# Patient Record
Sex: Male | Born: 1975 | Race: Black or African American | Hispanic: No | Marital: Single | Smoking: Current every day smoker
Health system: Southern US, Community
[De-identification: ages and names within clinical notes are randomized; demographics above are authoritative.]

## PROBLEM LIST (undated history)

## (undated) DIAGNOSIS — R569 Unspecified convulsions: Secondary | ICD-10-CM

## (undated) DIAGNOSIS — M1990 Unspecified osteoarthritis, unspecified site: Secondary | ICD-10-CM

## (undated) DIAGNOSIS — M8700 Idiopathic aseptic necrosis of unspecified bone: Secondary | ICD-10-CM

## (undated) DIAGNOSIS — F1910 Other psychoactive substance abuse, uncomplicated: Secondary | ICD-10-CM

## (undated) DIAGNOSIS — F209 Schizophrenia, unspecified: Secondary | ICD-10-CM

## (undated) DIAGNOSIS — M87059 Idiopathic aseptic necrosis of unspecified femur: Secondary | ICD-10-CM

## (undated) HISTORY — PX: NO PAST SURGERIES: SHX2092

## (undated) HISTORY — DX: Other psychoactive substance abuse, uncomplicated: F19.10

## (undated) HISTORY — DX: Schizophrenia, unspecified: F20.9

---

## 1998-06-01 ENCOUNTER — Emergency Department (HOSPITAL_COMMUNITY): Admission: EM | Admit: 1998-06-01 | Discharge: 1998-06-01 | Payer: Self-pay | Admitting: Internal Medicine

## 2001-12-12 ENCOUNTER — Encounter: Payer: Self-pay | Admitting: Emergency Medicine

## 2001-12-12 ENCOUNTER — Emergency Department (HOSPITAL_COMMUNITY): Admission: EM | Admit: 2001-12-12 | Discharge: 2001-12-12 | Payer: Self-pay | Admitting: Emergency Medicine

## 2002-05-25 ENCOUNTER — Emergency Department (HOSPITAL_COMMUNITY): Admission: EM | Admit: 2002-05-25 | Discharge: 2002-05-25 | Payer: Self-pay | Admitting: Emergency Medicine

## 2002-05-25 ENCOUNTER — Encounter: Payer: Self-pay | Admitting: Emergency Medicine

## 2003-03-01 ENCOUNTER — Emergency Department (HOSPITAL_COMMUNITY): Admission: EM | Admit: 2003-03-01 | Discharge: 2003-03-01 | Payer: Self-pay | Admitting: Emergency Medicine

## 2003-03-01 ENCOUNTER — Encounter: Payer: Self-pay | Admitting: Emergency Medicine

## 2003-04-09 ENCOUNTER — Encounter: Admission: RE | Admit: 2003-04-09 | Discharge: 2003-04-09 | Payer: Self-pay | Admitting: Sports Medicine

## 2003-07-01 ENCOUNTER — Emergency Department (HOSPITAL_COMMUNITY): Admission: EM | Admit: 2003-07-01 | Discharge: 2003-07-01 | Payer: Self-pay | Admitting: Emergency Medicine

## 2004-02-02 ENCOUNTER — Emergency Department (HOSPITAL_COMMUNITY): Admission: EM | Admit: 2004-02-02 | Discharge: 2004-02-02 | Payer: Self-pay | Admitting: Emergency Medicine

## 2004-03-23 ENCOUNTER — Emergency Department (HOSPITAL_COMMUNITY): Admission: EM | Admit: 2004-03-23 | Discharge: 2004-03-23 | Payer: Self-pay | Admitting: Emergency Medicine

## 2004-05-22 ENCOUNTER — Emergency Department (HOSPITAL_COMMUNITY): Admission: EM | Admit: 2004-05-22 | Discharge: 2004-05-22 | Payer: Self-pay | Admitting: Emergency Medicine

## 2004-06-27 ENCOUNTER — Emergency Department (HOSPITAL_COMMUNITY): Admission: EM | Admit: 2004-06-27 | Discharge: 2004-06-27 | Payer: Self-pay | Admitting: Emergency Medicine

## 2004-09-30 ENCOUNTER — Ambulatory Visit: Payer: Self-pay | Admitting: Infectious Diseases

## 2004-10-03 ENCOUNTER — Inpatient Hospital Stay (HOSPITAL_COMMUNITY): Admission: EM | Admit: 2004-10-03 | Discharge: 2004-10-06 | Payer: Self-pay

## 2004-10-11 ENCOUNTER — Ambulatory Visit: Payer: Self-pay | Admitting: Internal Medicine

## 2004-11-21 ENCOUNTER — Emergency Department (HOSPITAL_COMMUNITY): Admission: EM | Admit: 2004-11-21 | Discharge: 2004-11-21 | Payer: Self-pay | Admitting: Emergency Medicine

## 2005-01-26 ENCOUNTER — Emergency Department (HOSPITAL_COMMUNITY): Admission: EM | Admit: 2005-01-26 | Discharge: 2005-01-26 | Payer: Self-pay | Admitting: Emergency Medicine

## 2005-02-12 ENCOUNTER — Emergency Department (HOSPITAL_COMMUNITY): Admission: EM | Admit: 2005-02-12 | Discharge: 2005-02-12 | Payer: Self-pay | Admitting: Emergency Medicine

## 2005-04-25 ENCOUNTER — Emergency Department (HOSPITAL_COMMUNITY): Admission: EM | Admit: 2005-04-25 | Discharge: 2005-04-26 | Payer: Self-pay | Admitting: Emergency Medicine

## 2005-08-14 ENCOUNTER — Inpatient Hospital Stay (HOSPITAL_COMMUNITY): Admission: EM | Admit: 2005-08-14 | Discharge: 2005-08-16 | Payer: Self-pay | Admitting: *Deleted

## 2005-10-02 ENCOUNTER — Emergency Department (HOSPITAL_COMMUNITY): Admission: EM | Admit: 2005-10-02 | Discharge: 2005-10-02 | Payer: Self-pay | Admitting: Emergency Medicine

## 2007-03-11 ENCOUNTER — Emergency Department (HOSPITAL_COMMUNITY): Admission: EM | Admit: 2007-03-11 | Discharge: 2007-03-11 | Payer: Self-pay | Admitting: Emergency Medicine

## 2007-03-11 ENCOUNTER — Inpatient Hospital Stay (HOSPITAL_COMMUNITY): Admission: EM | Admit: 2007-03-11 | Discharge: 2007-03-17 | Payer: Self-pay | Admitting: Psychiatry

## 2007-03-11 ENCOUNTER — Ambulatory Visit: Payer: Self-pay | Admitting: Psychiatry

## 2007-05-04 ENCOUNTER — Emergency Department (HOSPITAL_COMMUNITY): Admission: EM | Admit: 2007-05-04 | Discharge: 2007-05-04 | Payer: Self-pay | Admitting: Emergency Medicine

## 2007-06-05 ENCOUNTER — Emergency Department (HOSPITAL_COMMUNITY): Admission: EM | Admit: 2007-06-05 | Discharge: 2007-06-05 | Payer: Self-pay | Admitting: Emergency Medicine

## 2008-06-02 ENCOUNTER — Ambulatory Visit: Payer: Self-pay | Admitting: Psychiatry

## 2008-06-02 ENCOUNTER — Inpatient Hospital Stay (HOSPITAL_COMMUNITY): Admission: AD | Admit: 2008-06-02 | Discharge: 2008-06-06 | Payer: Self-pay | Admitting: Psychiatry

## 2008-06-02 ENCOUNTER — Emergency Department (HOSPITAL_COMMUNITY): Admission: EM | Admit: 2008-06-02 | Discharge: 2008-06-02 | Payer: Self-pay | Admitting: Emergency Medicine

## 2008-11-07 ENCOUNTER — Emergency Department (HOSPITAL_COMMUNITY): Admission: EM | Admit: 2008-11-07 | Discharge: 2008-11-07 | Payer: Self-pay | Admitting: *Deleted

## 2008-11-08 ENCOUNTER — Emergency Department (HOSPITAL_COMMUNITY): Admission: EM | Admit: 2008-11-08 | Discharge: 2008-11-08 | Payer: Self-pay | Admitting: Emergency Medicine

## 2009-05-04 ENCOUNTER — Emergency Department (HOSPITAL_COMMUNITY): Admission: EM | Admit: 2009-05-04 | Discharge: 2009-05-04 | Payer: Self-pay | Admitting: Emergency Medicine

## 2009-08-14 ENCOUNTER — Emergency Department (HOSPITAL_COMMUNITY): Admission: EM | Admit: 2009-08-14 | Discharge: 2009-08-15 | Payer: Self-pay | Admitting: Emergency Medicine

## 2010-05-06 ENCOUNTER — Emergency Department (HOSPITAL_BASED_OUTPATIENT_CLINIC_OR_DEPARTMENT_OTHER): Admission: EM | Admit: 2010-05-06 | Discharge: 2010-05-06 | Payer: Self-pay | Admitting: Emergency Medicine

## 2010-09-18 ENCOUNTER — Emergency Department (HOSPITAL_COMMUNITY): Admission: EM | Admit: 2010-09-18 | Discharge: 2010-09-19 | Payer: Self-pay | Admitting: Emergency Medicine

## 2010-12-23 ENCOUNTER — Emergency Department (HOSPITAL_COMMUNITY)
Admission: EM | Admit: 2010-12-23 | Discharge: 2010-12-24 | Payer: Self-pay | Source: Home / Self Care | Admitting: Emergency Medicine

## 2010-12-27 ENCOUNTER — Emergency Department (HOSPITAL_COMMUNITY)
Admission: EM | Admit: 2010-12-27 | Discharge: 2010-12-27 | Payer: Self-pay | Source: Home / Self Care | Admitting: Emergency Medicine

## 2011-03-09 LAB — POCT I-STAT, CHEM 8
BUN: 8 mg/dL (ref 6–23)
Calcium, Ion: 1.14 mmol/L (ref 1.12–1.32)
Chloride: 104 mEq/L (ref 96–112)
Hemoglobin: 16 g/dL (ref 13.0–17.0)

## 2011-03-14 LAB — DIFFERENTIAL
Basophils Absolute: 0.1 K/uL (ref 0.0–0.1)
Basophils Relative: 1 % (ref 0–1)
Eosinophils Absolute: 0.1 K/uL (ref 0.0–0.7)
Eosinophils Relative: 3 % (ref 0–5)
Lymphocytes Relative: 43 % (ref 12–46)
Lymphs Abs: 2.5 K/uL (ref 0.7–4.0)
Monocytes Absolute: 0.4 K/uL (ref 0.1–1.0)
Monocytes Relative: 7 % (ref 3–12)
Neutro Abs: 2.7 K/uL (ref 1.7–7.7)
Neutrophils Relative %: 47 % (ref 43–77)

## 2011-03-14 LAB — POCT TOXICOLOGY PANEL

## 2011-03-14 LAB — COMPREHENSIVE METABOLIC PANEL
Albumin: 4.5 g/dL (ref 3.5–5.2)
BUN: 14 mg/dL (ref 6–23)
Chloride: 105 mEq/L (ref 96–112)
GFR calc Af Amer: >60 mL/min (ref >60)
Potassium: 4.9 mEq/L (ref 3.5–5.1)
Sodium: 148 mEq/L — ABNORMAL HIGH (ref 135–145)
Total Protein: 8.4 g/dL — ABNORMAL HIGH (ref 6.0–8.3)

## 2011-03-14 LAB — CBC
HCT: 40.4 % (ref 39.0–52.0)
Hemoglobin: 13.8 g/dL (ref 13.0–17.0)
MCHC: 34.1 g/dL (ref 30.0–36.0)
MCV: 84.8 fL (ref 78.0–100.0)
Platelets: 389 K/uL (ref 150–400)
RBC: 4.76 MIL/uL (ref 4.22–5.81)
RDW: 13.2 % (ref 11.5–15.5)
WBC: 5.8 K/uL (ref 4.0–10.5)

## 2011-03-14 LAB — ETHANOL: Alcohol, Ethyl (B): 207 mg/dL — ABNORMAL HIGH (ref 0–10)

## 2011-03-14 LAB — PHENYTOIN LEVEL, TOTAL: Phenytoin Lvl: <3 ug/mL — ABNORMAL LOW (ref 10.0–20.0)

## 2011-04-01 LAB — POCT I-STAT, CHEM 8
Calcium, Ion: 1.24 mmol/L (ref 1.12–1.32)
HCT: 45 % (ref 39.0–52.0)
Hemoglobin: 15.3 g/dL (ref 13.0–17.0)
Potassium: 3.7 mEq/L (ref 3.5–5.1)
Sodium: 137 mEq/L (ref 135–145)
TCO2: 25 mmol/L (ref 0–100)

## 2011-04-01 LAB — RAPID URINE DRUG SCREEN, HOSP PERFORMED
Cocaine: NOT DETECTED
Opiates: NOT DETECTED

## 2011-04-01 LAB — ETHANOL: Alcohol, Ethyl (B): <5 mg/dL (ref 0–10)

## 2011-04-01 LAB — PHENYTOIN LEVEL, TOTAL: Phenytoin Lvl: <2.5 ug/mL — ABNORMAL LOW (ref 10.0–20.0)

## 2011-04-04 LAB — PHENYTOIN LEVEL, TOTAL: Phenytoin Lvl: <2.5 ug/mL — ABNORMAL LOW (ref 10.0–20.0)

## 2011-04-04 LAB — POCT I-STAT, CHEM 8
BUN: 6 mg/dL (ref 6–23)
Sodium: 135 mEq/L (ref 135–145)
TCO2: 27 mmol/L (ref 0–100)

## 2011-05-09 NOTE — H&P (Signed)
[name], [name]                ACCOUNT NO.:  [id number]   MEDICAL RECORD NO.:  [id number]          PATIENT TYPE:  IPS   LOCATION:  0406                          FACILITY:  [facility]   PHYSICIAN:  Geoffery Lyons, M.D.      DATE OF BIRTH:  1976-05-26   DATE OF ADMISSION:  06/02/2008  DATE OF DISCHARGE:                       PSYCHIATRIC ADMISSION ASSESSMENT   HISTORY OF PRESENT ILLNESS:  The patient presents with a history of  psychosis, experiencing auditory hallucinations, states that they are  not telling him to hurt himself or others.  He denies any suicidal or  homicidal thoughts.  He states that he is here to go get away after  having a conflict with his sister.  He has been sleeping satisfactory.  Appetite has been satisfactory.  Promises safety on the unit.   PAST PSYCHIATRIC HISTORY:  The patient was here approximately one year  ago.  Goes to [facility] for outpatient mental health  services.   SOCIAL HISTORY:  He is a 35 year old single male who lives his sister.  He lives here in [place].   FAMILY HISTORY:  None.   ALCOHOL AND DRUG HISTORY:  The patient smokes.  Has had some recent  alcohol intake.  Denies any drug use.   PRIMARY CARE Ernisha Sorn:  None.   MEDICAL PROBLEMS:  Are seizure disorder, hypertension with his last  seizure being 2 months ago.  The patient reports going to the emergency  room to get his refills on his medications.   MEDICATIONS:  Dilantin 100 mg b.i.d. and Cogentin 1 mg at bedtime   DRUG ALLERGIES:  No known allergies.   PHYSICAL EXAMINATION:  This is a well-nourished young male who was  assessed at [facility].  He did receive Dilantin 400 mg because of a  Dilantin level of less than 2.5.  His temperature is 98.2, 92 heart  rate, 20 respirations, blood pressure is 106/65.  He is 98% saturated.   His laboratory data and Dilantin level less than 2.5.  Urine drug screen  is negative.  His hematocrit is 38.3.  Alcohol level was  18.  UA was  negative.   MENTAL STATUS EXAM:  The patient is awake, cooperative, resting in his  bed.  He has fair eye contact.  Speech is clear, brief responses.  Mood  is depressed.  The patient is pleasant but does not offer much  information, endorsing auditory hallucinations.  Denies any suicidal or  homicidal thoughts.  Does not appear to be actively responding to  internal stimuli.  Cognitive function intact.  His memory is fair.  Judgment and insight is fair.  He is somewhat of a poor historian.   AXIS I:  Psychosis, not otherwise specified.  AXIS II:  Deferred.  AXIS III:  Seizure disorder and hypertension.  AXIS IV:  Psychosocial problems, possible problems related with primary  support group relating to his sister.  AXIS V:  Current is 35-40.   Will put patient on seizure precautions.  We will resume his Dilantin  200 mg p.o. b.i.d.  The patient was encouraged to  get a primary care  doctor for his health issues.  Will contact sister for background and  returning to living arrangements.  Will have Zyprexa available on a  p.r.n. basis.  Will reinforce medication compliance.  The patient is to  attend the yellow group.  Tentative length of stay is 3-5 days.      Landry Corporal, N.P.      Geoffery Lyons, M.D.  Electronically Signed    JO/MEDQ  D:  06/05/2008  T:  06/05/2008  Job:  [id number]

## 2011-05-12 NOTE — Discharge Summary (Signed)
NAME:  [name], [name]                ACCOUNT NO.:  [id number]   MEDICAL RECORD NO.:  [id number]          PATIENT TYPE:  INP   LOCATION:  5020                         FACILITY:  [facility]   PHYSICIAN:  Sherin Quarry, MD      DATE OF BIRTH:  1976/06/21   DATE OF ADMISSION:  08/13/2005  DATE OF DISCHARGE:  08/16/2005                                 DISCHARGE SUMMARY   HISTORY OF PRESENT ILLNESS:  [name] is a 35 year old male with a long-  standing history of seizures who was admitted to [facility] on  several occasions last year because of recurrent seizures.  On August 14, 2005 about 10 p.m. his sister witnessed him to have a generalized seizure  associated with choking, gagging and a sound as if he was swallowing his  secretions.  He was transported to the [facility] and on arrival  was noted to have a temperature of 100, pulse of 130 and oxygen saturation  of 88%.  He was placed on oxygen.  Chest x-ray showed only evidence of  bronchitis.  The patient was found to have no Dilantin in his blood and  therefore was given 400 mg of p.o. Dilantin on two occasions over the course  of the evening.  The patient states that he lost his Dilantin and has not  had any for six weeks.  The patient has a long-standing history of alcohol  abuse and states that he drank a particularly large amount of alcohol two  days prior to admission but had not drank any alcohol since.  His blood  alcohol level was negative on admission.   PHYSICAL EXAMINATION:  GENERAL:  Revealed an awake, alert, cooperative man.  VITAL SIGNS:  Blood pressure was 150/90, pulse 133, respirations 20, he was  on four liters of oxygen.  CHEST:  Revealed scattered rhonchi and expiratory wheezing.  CARDIOVASCULAR:  Revealed a sinus tachycardia.  There were no rubs, murmurs  or gallops.  ABDOMEN:  Benign.  NEUROLOGIC:  Cranial nerves, motor, sensory and cerebellar testing was  normal.  EXTREMITIES:  Revealed no  evidence of clubbing, cyanosis, or edema.   LABORATORY DATA:  Relevant studies included a sodium of 136, potassium 3.1,  creatinine 1.0, BUN 4, hemoglobin 11.9, CBC revealed a white count of 6300.  The D-dimer was 0.86.  A1C hemoglobin was 5.7.  As previously mentioned  there was no detectable Dilantin in the blood.  Urine drug screen was  positive for THC.  A followup chest x-ray was obtained on August 15, 2005  which showed a small left lower lobe infiltrate possibly consistent with a  developing pneumonia.   HOSPITAL COURSE:  On admission the patient was placed on Ativan alcohol  withdrawal protocol.  He was given oxygen and intravenous fluids in the form  of normal saline at 125 mL/h.  Avelox 400 mg IV daily was started and the  patient was placed on nebulizer treatments with Xopenex 1.25 every four  hours and p.r.n.  As part of the Ativan alcohol withdrawal protocol he  received thiamine 100  mg IV daily.  Subsequently he was placed on Dilantin  300 mg daily.  Efforts were made to arrange for alcohol detoxification  program and the patient will go through intake procedure with [facility] to initiate detoxification program in [place].  Therefore on August 16, 2005 the patient was discharged.   DISCHARGE DIAGNOSES:  1.  Recurrent seizures secondary to medication noncompliance and alcohol      abuse.  2.  Chronic alcohol abuse with history of delirium tremens.  3.  Probable aspiration pneumonia.  4.  A 25 pack year smoking history.  5.  History of cocaine abuse.   DISCHARGE MEDICATIONS:  The patient will remain on Dilantin 300 mg daily.  He will receive Avelox 400 mg daily x7 additional days.  Hopefully he will  be continued on alcohol detoxification program at the [facility] facility.           ______________________________  Sherin Quarry, MD     SY/MEDQ  D:  08/16/2005  T:  08/16/2005  Job:  [id number]
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
## 2011-05-12 NOTE — Discharge Summary (Signed)
NAMEKhyre Garcia, [name]                ACCOUNT NO.:  [id number]   MEDICAL RECORD NO.:  [id number]          PATIENT TYPE:  INP   LOCATION:  5727                         FACILITY:  [facility]   PHYSICIAN:  Fransisco Hertz, M.D.  DATE OF BIRTH:  [birth year]   DATE OF ADMISSION:  09/30/2004  DATE OF DISCHARGE:  10/06/2004                                 DISCHARGE SUMMARY   DIAGNOSES ON THIS ADMISSION:  1.  Alcoholic gastritis.  2.  Alcohol abuse with alcohol withdrawal.  3.  Cocaine abuse.  4.  Volume depletion.  5.  History of seizures.  6.  Renal insufficiency.  7.  Psychosis.   DISCHARGE MEDICATIONS:  1.  Dilantin 300 mg p.o. nightly.  2.  Lisinopril 20 mg half a tab p.o. daily.   DISCHARGE INSTRUCTIONS:  The patient was provided with the number for ADS,  [phone].  He was to stop drinking and to drop cocaine abuse.   DIET AND WOUND CARE:  He had no restrictions on diet or wound care.   FOLLOWUP:  He has an appointment with Dr. Laurell Roof at [facility] on October 11, 2004 at 3 p.m., phone number [phone].   HISTORY OF PRESENT ILLNESS:  [name] is a 35 year old man with history of  seizure activity, not well-defined, who has had 2 episodes in the last year,  otherwise, he has no past medical history.  He presented to the emergency  room with emesis x1 day.  The emesis consisted of digested food and bile, no  blood, no coffee-grounds material.  He has abdominal pain which is  intermittent and diffuse, however, more pronounced in deep epigastric area.  He has no diarrhea, negative fevers.  He has some chills and he also  complained of anorexia, denied dysuria or hematochezia and/or acid reflux.   PHYSICAL EXAMINATION:  VITAL SIGNS:  On physical exam, he had a blood  pressure of 131/84, temperature 99.3, respirations 16, O2 saturation of 97.  GENERAL:  He was in no acute distress, oriented x4.  ENT:  Within normal limits.  NECK:  Neck was supple.  LUNGS:   Lungs clear to auscultation with good air movement.  HEART:  Regular rate and rhythm.  ABDOMEN:  Soft.  No distention.  He had some epigastric and right upper  quadrant tenderness with no rebound, no guarding and good bowel sounds.  He  had a negative Murphy's.  He had no edema and without any rash.  NEUROLOGIC:  Intact.  PSYCHIATRIC:  __________  mood and affect.   LABORATORIES ON ADMISSION:  He had a Dilantin level of less than 2.5, lipase  of 35, sodium of 133, potassium of 4.3, chloride of 101, bicarb of 19, BUN  of 14, creatinine of 1.6, glucose of 80.  His transaminases were negative  for a bilirubin of 1.1, alkaline phosphatase of 23, AST of 200, ALT of 117,  protein of 6.7, albumin of 3.9, calcium of 8.5.  He had a white count of  9.9, hemoglobin of 13.9, ANC of 7.3, platelets of 123 and MCV of  92.   He had an EKG with normal sinus rhythm, no ST changes.   He had alcohol level of less than 5 and a PT of 14.   HOSPITAL COURSE:  PROBLEM #1 - NAUSEA AND VOMITING:  Given the patient's  anorexia and volume depletion, the patient was supported with IV fluids very  aggressively, normal saline, and electrolytes were replaced as needed.  He  was first kept n.p.o., then he was started on clear liquids and titrated his  fluid as tolerated.  The elevated transaminases which were consistent with  an alcohol etiology continued to be improving, however, hepatitis panel was  checked and was negative for hepatitis.  He also had an HIV that was  nonreactive.  The patient, as mentioned, was able to tolerate p.o.'s and it  was presumed that the patient had a gastritis, which was treated with a PPI  secondary to the alcohol abuse.   PROBLEM #2 - HISTORY OF SEIZURE DISORDER:  The patient was seizure-free  during hospitalization, however, he was started back on Dilantin, given his  history of seizures in the past.   PROBLEM #3 - ACUTE RENAL FAILURE:  The patient was aggressively hydrated.  He had  a renal ultrasound which was negative.  He had a UA which was also  negative and it is presumed that this is the patient's baseline, which is a  borderline creatinine at 1.5 to 1.8.   PROBLEM #4 - ALCOHOL ABUSE:  Though the patient admitted on admission to  alcohol abuse, he later denied ever taking in alcohol.  He had multiple  family members reiterate that he has a long history of alcohol abuse.  The  patient did go through alcohol withdrawal.  He was started on a  benzodiazepine to help with the withdrawal symptoms.   PROBLEM #5 - HISTORY OF COCAINE ABUSE:  Again, the patient __________  cocaine abuse and he had a urine drug screen that was positive for cocaine  metabolites.   PROBLEM #6 - PSYCHOSIS:  The patient had a period of psychotic symptoms  while he was undergoing alcohol withdrawal.  His symptoms were consistent  with polysubstance abuse, however, the family mentions that the patient had  had some other psychotic episodes in the past and given that they wanted to  go against the patient's wishes and wanted to seek placement for the  patient's, a formal psychiatric consult was done.  Dr. Antonietta Breach  determined that the patient's psychosis was only episode related to his  substance abuse and otherwise he was competent to make his decisions and the  patient was consulted at length with regard to both problems, and given  numbers to contact ADS.  As mentioned above, the patient was stable on day  of discharge and he was to follow up with Dr. Jeanelle Malling.       LC/MEDQ  D:  11/13/2004  T:  11/14/2004  Job:  [id number]

## 2011-05-12 NOTE — Consult Note (Signed)
[place]  Patient:    [name] Visit Number: [id number] MRN: [id number]          Service Type: EMS Location: [facility] Attending Physician:  Ilene Qua Dictated by:   Deanna Artis. Sharene Skeans, M.D. Proc. Date: 05/25/02 Admit Date:  05/25/2002                            Consultation Report  DATE OF BIRTH:  [birth year]  CHIEF COMPLAINT:  New onset seizures.  HISTORY OF PRESENT CONDITION:   [name] is a 35 year old, right-handed, single, African-American young man who had onset of a seizure today around 3:30 p.m.  The patient had awakened this morning and felt nauseated and vomited.  He still felt rather poorly but went out to a store with his girlfriend.  In the store, the patient began to twist around and clenched his arms up to his body. He fell to the ground and had a generalized tonic-clonic seizure that lasted for possibly 15 minutes; timing is uncertain.  The patient continued to have slight jerking as EMS arrived; however, they felt the patient was conscious and confused, postictal, but able to answer simple questions.  He did not lose control of his bowels or bladder.  He had bloody saliva come from his mouth and, indeed, had bitten the back of his tongue.  He also seems to have some lacerations in the back of his gums as if by blunt trauma.  The patient has never had a seizure before, and he denies use of cocaine or other recreational drugs.  He does drink alcohol and says the day before the seizure, he had a "couple of beers and a shot of gin."   He has alcohol up to four cans per day on may days but not every day.  The patient has never had a closed head injury.  There are no other know risk factors for seizure in this patient.  REVIEW OF SYSTEMS:  Constitutional: The patient usually has good appetite and normal sleep habits.  The reason for his nausea is unclear.  He had not eaten anything in the morning, but his  mother says there are things that eats that do not agree with him.  I do not have the feeling that this was alcohol related, although I am not certain about that.  The patient has not had any fevers, rhinorrhea, cough, shortness of breath, chest pain, hypertension, bleeding dyscrasias, diarrhea, or musculoskeletal complaints.  No diabetes.  Review of Systems is otherwise negative.  PRESENT MEDICATIONS:  The patient has not had significant medical problems.  PAST SURGICAL HISTORY:  None.  CURRENT MEDICATIONS:  None.  ALLERGIES:  None.  FAMILY HISTORY:  Positive to stroke and Alzheimers disease in his maternal uncle and maternal grandmother, respectively.  No history of seizures, migraines, mental retardation, blindness, deafness, birth defects, consanguinity.  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  He does not use cigarettes or tobacco.  PHYSICAL EXAMINATION:  GENERAL:  Pleasant gentleman, subdued, in no acute distress.  VITAL SIGNS:  Blood pressure 142/69, resting pulse 98, respirations 20, pulse oximetry 98%.  Afebrile.  HEENT:  No signs of infection.  He does have a lacerated tongue on the left side.  As mentioned, back in the angles of his jaw, there appear to be two small lacerations that look as if there was  blunt trauma.  Buccal mucosa looks fine.  NECK:  Supple.  No localized tenderness.  No cranial or cervical bruits.  LUNGS:  Clear to auscultation.  HEART:  No murmurs, pulses normal.  ABDOMEN:  Soft, nontender.  Bowel sounds normal.  EXTREMITIES:  Well formed without edema, cyanosis, alterations in tone, or tight heel cords.  NEUROLOGIC:  Mental status: The patient was awake, alert, attentive, appropriate.  No dysphagia or dyspraxia.  Cranial Nerves: Round, reactive pupils, normal fundi.  Full visual fields to double simultaneous stimulation. Extraocular movements full and conjugate.  OKN responses equal  bilaterally. Symmetric facial strength.  Midline tongue and uvula.  Air conduction greater than bone conduction bilaterally.  Motor examination: Normal strength, tone, and mass.  Good fine motor movements.  No pronator drift.  Sensation intact to cold, vibration, stereoagnosis.  Cerebellar examination: Good finger-to-nose, rapid repetitive movements.  Gait and station was normal.  He was able to walk on his heels and toes and perform a tandem without difficulty.  IMPRESSION:  New onset seizure, not definitely epilepsy (780.39).  MEDICAL DECISION MAKING:  The patient has no predisposing factors to cause him to have seizures unless his history about alcohol use is specious.  At this time, the likelihood of recurrent seizure within the next 24 hours is low. Therefore, there is no reason to admit him to the hospital.  He has been watched between 4 oclock and the present time, 7:40, without any signs of seizure activity.  I personally reviewed his CT scan of the brain, and it is normal.  His i-STAT showed a glucose of 131, BUN 7, sodium 133, potassium 4.5, chloride 103, CO2 24.  Hemoglobin 18, hematocrit 53.  His pH was 7.337, delta base -3.  PLAN:  The patient will have an EEG either at [facility] or at [facility] early this week.  Depending upon the results, we may place him on antiepileptic medicines.  I discussed this thoroughly with the family including the odds of recurrent seizures.  With normal examination, CT, and if he has normal EEG and no positive family history, the likelihood of recurrence is only about 30%.  With a positive EEG and other factors being positive, it may be has high as 70%.  The patient does not drive any motor vehicle.  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  The patient was sent out with warnings to be careful in closed areas such as the bathroom.  He cannot swim alone or wander from his home alone for the next several  months.  He is to return tonight if he has recurrent seizure, and he will be admitted to the  hospital.  If has recurrent seizure at some remote time, we will likely place him on antiepileptic medicine at that time if it has not already happened. Dictated by:   Deanna Artis. Sharene Skeans, M.D. Attending Physician:  Ilene Qua DD:  05/25/02 TD:  05/26/02 Job: [id number]

## 2011-05-12 NOTE — H&P (Signed)
NAMEAJANI, RINEER                ACCOUNT NO.:  [id number]   MEDICAL RECORD NO.:  [id number]          PATIENT TYPE:  INP   LOCATION:                               FACILITY:  [facility]   PHYSICIAN:  Sherin Quarry, MD      DATE OF BIRTH:  1976-08-18   DATE OF ADMISSION:  08/14/2005  DATE OF DISCHARGE:                                HISTORY & PHYSICAL   Mr. [name] is a 35 year old man with a long-standing history of  seizures.  Mr. [name] was admitted to [facility] on three occasions  last year because of generalized seizures.  Tonight at about 10 p.m., his  sister witnessed him to have a generalized seizure and he was transported by  EMS to [facility].  On arrival to the ER, he was noted to have a  temperature of 100.  His pulse was 130.  Initially his O2 saturation was  88%.  His O2 saturation at this point is 94% on 4 L.  A chest x-ray was  obtained which showed only evidence of bronchitis.  The patient was given  dilantin 400 mg p.o. and a nebulizer treatment and [facility] were  called to arrange hospitalization.   Mr. [name] has a long-standing history of not taking his dilantin.  He says  that he lost it and has not taken it for several weeks.  He apparently gets  his dilantin normally by coming to the ER and having the doctors there write  him prescriptions.  Mr. [name] has a long-standing history of alcohol abuse.  He says that he drank a substantial amount of alcohol two days ago but has  not drunk any alcohol since that time.  The alcohol level in the blood is  negative.  He has a former history of cocaine abuse but he says that he has  been smoking crack recently and there is no evidence of cocaine in his  urine.   MEDICATIONS:  Essentially none.   ALLERGIES:  None.   PAST MEDICAL HISTORY:  The patient states that he has had no operations.  He  states that he has never been hospitalized except for problems related to  seizures.   FAMILY HISTORY:   The patient states that he does not know his father.  His  mother has a history of diabetes.  His brother and sister are in good health  although he thinks that his brother may having a surgical procedure in  [place] in the near future.  He does not know anything about the details  of this.   SOCIAL HISTORY:  The patient lives with his sister.  He smokes about 1/2 to  one pack of cigarettes per day.  He smokes marijuana.  Drug use is as  described above.   REVIEW OF SYSTEMS:  HEENT: Head: He denies headache or dizziness.  Eyes: He  denies visual blurring or diplopia.  Ear, nose, and throat: He denies  earaches, sinus pain, or sore throat.  CHEST: He states that he had a little  bit of a cough  before he had the seizure and had noted that he was coughing  up some clear phlegm.  He denies chest pain.  CARDIOVASCULAR: He denies  orthopnea or PND.  GI: He denies nausea, vomiting, or abdominal pain.  GENITOURINARY: Denies dysuria or urinary frequency.  NEUROLOGIC: See above.  ENDOCRINE: Denies excessive thirst, urinary frequency, or nocturia.   PHYSICAL EXAMINATION:  GENERAL: He is a awake and alert and cooperative.  VITAL SIGNS: Temperature is 100, blood pressure 154/94, pulse is 133 and  regular, respirations 20.  He is on 4 L of oxygen.  HEENT: Within normal limits.  CHEST: Scattered rhonchi, mild expiratory wheezing.  CARDIOVASCULAR: Tachycardia.  There is no S1, S2, no rubs, murmurs, or  gallops.  ABDOMEN: Benign, normal bowel sounds, without masses or tenderness.  NEUROLOGIC: Testing is within normal limits including normal cranial nerves,  motor, sensory, and cerebellar testing.  EXTREMITIES: No evidence of cyanosis or edema.   LABORATORY STUDIES:  Sodium is 130, potassium is 3.8, glucose is 165.  Hemoglobin 16.  White count is 6300.  Urine is positive for THC.  Alcohol  level is 5.  Dilantin level is less than 2.5.  Arterial blood gas showed a  PO2 of 63.   IMPRESSION:  1.   Recurrent seizure disorder with seizure occurring because of      noncompliance with medication.  2.  Chronic alcohol abuse with history of delirium tremens.  3.  Aspiration versus bronchitis.  4.  A 25 pack/year smoking history.  5.  Past history of cocaine abuse.   The patient will be admitted.  We will give him oxygen nebulizer treatment.  I will give him IV Avelox empirically because of bronchitis versus  aspiration and we will plan to repeat his chest x-ray.  We will give him IV  fluids and place him on Ativan, alcohol withdrawal protocol.           ______________________________  Sherin Quarry, MD     SY/MEDQ  D:  08/14/2005  T:  08/14/2005  Job:  [id number]

## 2011-05-12 NOTE — Discharge Summary (Signed)
NAME:  Sida, Quinton                ACCOUNT NO.:  [id number]   MEDICAL RECORD NO.:  [id number]          PATIENT TYPE:  IPS   LOCATION:  0406                          [facility]ACILITY[facility]  [facility]   PHYSICIAN:  Geoffery Lyons, M.D.      DATE OF BIRTH:  12/24/1976   DATE OF ADMISSION:  06/02/2008  DATE OF DISCHARGE:  06/06/2008                               DISCHARGE SUMMARY   CHIEF COMPLAINT/PRESENT ILLNESS:  This was the second admission to [facility] for this 35 year old male who presented with a  history of psychosis, experiencing auditory hallucinations, stating that  they are not telling him to hurt himself or others.  Denies any suicidal  or homicidal thoughts.  States that he is here to get away after having  a conflict with his sister.  Has been sleeping satisfactorily.   PAST PSYCHIATRIC HISTORY:  Was here one year prior to this admission.  Goes to [employer].   ALCOHOL HISTORY:  Had some recent alcohol intake.  Denied any regular  drug use.   MEDICAL HISTORY:  1. Seizure disorder. Last seizure 2 months prior to this admission.  2. Hypertension.   MEDICATIONS:  1. Dilantin 100 mg twice daily.  2. Cogentin 1 mg at night.   PHYSICAL EXAMINATION:  Failed to show any acute findings.   LABORATORY WORKUP:  Dilantin level less than 2.5.  UD is negative.  Hematocrit 38.3.  Alcohol level 18.  UA was negative.   MENTAL STATUS EXAM:  Reveals an alert, cooperative male resting in bed.  Fair eye contact.  Pupils clear.  Brief response.  Mood is depressed.  Affect depressed.  Thought process logical, coherent and relevant.  Denies any acute suicidal or homicidal ideas.  No hallucinations or  delusions.  Cognition well preserved.   ADMISSION DIAGNOSES:  Axis I:  Psychotic disorder, not otherwise  specified.  Axis II:  No diagnosis.  Axis III:  Seizure disorder, hypertension.  Axis IV:  Moderate.  Axis V:  Upon admission 35.  Highest GAF in the last year 60.   COURSE IN  THE HOSPITAL:  He was admitted, started on individual and  group psychotherapy.  As already stated, he endorsed he was hearing  stuff.  He got into an argument with the sister.  Has three children.  Has a 76 year old boy that he sees every other weekend.  He was in [facility] once and [place]  once.  He had been staying  with his girlfriend after his sister kicked him out.  Had been having  auditory hallucinations, voices telling him to kill himself  and his  sister.  Endorsed that he had thought of overdosing on his medication,  but felt he would not do it.  June 12 said he was feeling okay.  He was  going back and stay with the girlfriend.  Endorsed he was feeling  better.  On June 13 he was up and about, compliant with medication.  Endorsed no suicidal or homicidal ideas, no hallucinations.  He was  going to be with his  sister.  We went ahead and made the appropriate  followup appointments.  Followup with Dr. Allena Napoleon at [facility].   DISCHARGE DIAGNOSES:  Axis I:  Psychotic disorder, not otherwise  specified.  Axis II:  No diagnosis.  Axis III:  Seizure disorder.  Axis IV:  Moderate.  Axis VI:  Upon discharge 50.   DISCHARGED ON:  1. Dilantin 100 mg 3 times daily.  2. Zyprexa 10 mg at bedtime.  3. Cogentin 1 mg at bedtime.   FOLLOWUP:  Dr. Allena Napoleon, [facility].      Geoffery Lyons, M.D.  Electronically Signed     IL/MEDQ  D:  07/08/2008  T:  07/08/2008  Job:  [id number]
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
## 2011-08-26 ENCOUNTER — Emergency Department (HOSPITAL_COMMUNITY)
Admission: EM | Admit: 2011-08-26 | Discharge: 2011-08-27 | Disposition: A | Discharge status: Home / Self Care | Payer: Self-pay | Attending: Emergency Medicine | Admitting: Emergency Medicine

## 2011-08-26 DIAGNOSIS — G2402 Drug induced acute dystonia: Secondary | ICD-10-CM | POA: Insufficient documentation

## 2011-08-26 DIAGNOSIS — R6884 Jaw pain: Secondary | ICD-10-CM | POA: Insufficient documentation

## 2011-08-26 DIAGNOSIS — G40909 Epilepsy, unspecified, not intractable, without status epilepticus: Secondary | ICD-10-CM | POA: Insufficient documentation

## 2011-08-26 DIAGNOSIS — I10 Essential (primary) hypertension: Secondary | ICD-10-CM | POA: Insufficient documentation

## 2011-08-26 DIAGNOSIS — Z760 Encounter for issue of repeat prescription: Secondary | ICD-10-CM | POA: Insufficient documentation

## 2011-08-26 DIAGNOSIS — K1370 Unspecified lesions of oral mucosa: Secondary | ICD-10-CM | POA: Insufficient documentation

## 2011-09-21 LAB — URINALYSIS, ROUTINE W REFLEX MICROSCOPIC
Nitrite: NEGATIVE
Protein, ur: NEGATIVE
Specific Gravity, Urine: 1.029
Urobilinogen, UA: 1

## 2011-09-21 LAB — BASIC METABOLIC PANEL
BUN: 8
Calcium: 9
GFR calc non Af Amer: >60
Potassium: 4

## 2011-09-21 LAB — DIFFERENTIAL
Basophils Relative: 0
Eosinophils Absolute: 0.2
Lymphs Abs: 1.7
Neutrophils Relative %: 41 — ABNORMAL LOW

## 2011-09-21 LAB — CBC
MCHC: 34.5
MCV: 92.1
Platelets: 216
WBC: 4.3

## 2011-09-21 LAB — ETHANOL: Alcohol, Ethyl (B): 18 — ABNORMAL HIGH

## 2011-09-21 LAB — URINE MICROSCOPIC-ADD ON

## 2011-09-21 LAB — PHENYTOIN LEVEL, TOTAL: Phenytoin Lvl: <2.5 — ABNORMAL LOW

## 2011-09-21 LAB — RAPID URINE DRUG SCREEN, HOSP PERFORMED: Barbiturates: NOT DETECTED

## 2011-09-26 LAB — CBC
RBC: 4.48
WBC: 5

## 2011-09-26 LAB — PHENYTOIN LEVEL, TOTAL: Phenytoin Lvl: <2.5 — ABNORMAL LOW

## 2011-09-26 LAB — COMPREHENSIVE METABOLIC PANEL
ALT: 64 — ABNORMAL HIGH
AST: 85 — ABNORMAL HIGH
CO2: 26
Calcium: 8.9
Chloride: 107
GFR calc Af Amer: >60
GFR calc non Af Amer: >60
Sodium: 145

## 2011-09-26 LAB — URINALYSIS, ROUTINE W REFLEX MICROSCOPIC
Glucose, UA: 100 — AB
Hgb urine dipstick: NEGATIVE
Specific Gravity, Urine: 1.029
pH: 5.5

## 2011-09-26 LAB — DIFFERENTIAL
Eosinophils Absolute: 0.1
Eosinophils Relative: 2
Lymphs Abs: 1.9
Monocytes Absolute: 0.4

## 2011-09-26 LAB — RAPID URINE DRUG SCREEN, HOSP PERFORMED: Barbiturates: NOT DETECTED

## 2011-09-26 LAB — URINE MICROSCOPIC-ADD ON

## 2011-10-12 LAB — BASIC METABOLIC PANEL
BUN: 7
Calcium: 9.4
Creatinine, Ser: 1
GFR calc non Af Amer: >60
Glucose, Bld: 103 — ABNORMAL HIGH
Sodium: 137

## 2011-10-12 LAB — CBC
MCHC: 34.3
Platelets: 267
RDW: 13.9

## 2011-10-12 LAB — DIFFERENTIAL
Basophils Absolute: 0
Basophils Relative: 0
Lymphocytes Relative: 18
Neutro Abs: 5.5
Neutrophils Relative %: 72

## 2011-10-12 LAB — RAPID URINE DRUG SCREEN, HOSP PERFORMED
Amphetamines: NOT DETECTED
Benzodiazepines: NOT DETECTED
Cocaine: NOT DETECTED
Opiates: NOT DETECTED
Tetrahydrocannabinol: NOT DETECTED

## 2011-10-12 LAB — PHENYTOIN LEVEL, TOTAL: Phenytoin Lvl: <2.5 — ABNORMAL LOW

## 2011-10-24 ENCOUNTER — Emergency Department (HOSPITAL_COMMUNITY)
Admission: EM | Admit: 2011-10-24 | Discharge: 2011-10-24 | Disposition: A | Discharge status: Home / Self Care | Payer: Medicaid Other | Attending: Emergency Medicine | Admitting: Emergency Medicine

## 2011-10-24 ENCOUNTER — Emergency Department (HOSPITAL_COMMUNITY): Payer: Medicaid Other

## 2011-10-24 DIAGNOSIS — M25559 Pain in unspecified hip: Secondary | ICD-10-CM | POA: Insufficient documentation

## 2011-10-24 DIAGNOSIS — Z79899 Other long term (current) drug therapy: Secondary | ICD-10-CM | POA: Insufficient documentation

## 2011-10-24 DIAGNOSIS — G40909 Epilepsy, unspecified, not intractable, without status epilepticus: Secondary | ICD-10-CM | POA: Insufficient documentation

## 2011-10-24 DIAGNOSIS — Z87828 Personal history of other (healed) physical injury and trauma: Secondary | ICD-10-CM | POA: Insufficient documentation

## 2012-02-05 DIAGNOSIS — F17200 Nicotine dependence, unspecified, uncomplicated: Secondary | ICD-10-CM | POA: Insufficient documentation

## 2012-02-05 DIAGNOSIS — F319 Bipolar disorder, unspecified: Secondary | ICD-10-CM | POA: Insufficient documentation

## 2012-02-05 DIAGNOSIS — I10 Essential (primary) hypertension: Secondary | ICD-10-CM | POA: Insufficient documentation

## 2012-02-05 DIAGNOSIS — M79609 Pain in unspecified limb: Secondary | ICD-10-CM | POA: Insufficient documentation

## 2012-02-05 DIAGNOSIS — G8929 Other chronic pain: Secondary | ICD-10-CM | POA: Insufficient documentation

## 2012-02-05 DIAGNOSIS — Z79899 Other long term (current) drug therapy: Secondary | ICD-10-CM | POA: Insufficient documentation

## 2012-02-05 DIAGNOSIS — M25559 Pain in unspecified hip: Secondary | ICD-10-CM | POA: Insufficient documentation

## 2012-02-06 ENCOUNTER — Encounter (HOSPITAL_COMMUNITY): Payer: Self-pay | Admitting: Emergency Medicine

## 2012-02-06 ENCOUNTER — Emergency Department (HOSPITAL_COMMUNITY)
Admission: EM | Admit: 2012-02-06 | Discharge: 2012-02-06 | Disposition: A | Discharge status: Home / Self Care | Payer: Medicaid Other | Attending: Emergency Medicine | Admitting: Emergency Medicine

## 2012-02-06 DIAGNOSIS — G8929 Other chronic pain: Secondary | ICD-10-CM

## 2012-02-06 HISTORY — DX: Unspecified convulsions: R56.9

## 2012-02-06 MED ORDER — ACETAMINOPHEN-CODEINE #3 300-30 MG PO TABS: 1.0000 | ORAL_TABLET | Freq: Four times a day (QID) | ORAL | Status: AC | PRN

## 2012-02-06 NOTE — ED Notes (Signed)
Pt complains of left leg pain, no new injury, out of pain meds
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
## 2012-02-06 NOTE — ED Provider Notes (Signed)
Medical screening examination/treatment/procedure(s) were performed by non-physician practitioner and as supervising physician I was immediately available for consultation/collaboration.  Jireh Vinas Smitty Cords, MD 02/06/12 0530
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
## 2012-02-06 NOTE — Discharge Instructions (Signed)
Take tylenol w/ codeine as prescribed.  Do not drive within four hours of taking this medication (may cause drowsiness or confusion).  You should continue taking ibuprofen, up to 800mg  three times a day with food.  Follow up with the orthopedic physician for further evaluation and treatment.  Call [facility] [phone]) if you do not have a primary care doctor and would like assistance with finding one.    You may return to the ER if symptoms worsen or you have any other concerns.

## 2012-02-24 ENCOUNTER — Encounter (HOSPITAL_COMMUNITY): Payer: Self-pay | Admitting: Emergency Medicine

## 2012-02-24 ENCOUNTER — Emergency Department (HOSPITAL_COMMUNITY)
Admission: EM | Admit: 2012-02-24 | Discharge: 2012-02-25 | Disposition: A | Discharge status: Other Acute Inpatient Hospital | Payer: Medicaid Other | Source: Home / Self Care | Attending: Emergency Medicine | Admitting: Emergency Medicine

## 2012-02-24 DIAGNOSIS — F1010 Alcohol abuse, uncomplicated: Secondary | ICD-10-CM | POA: Insufficient documentation

## 2012-02-24 DIAGNOSIS — R443 Hallucinations, unspecified: Secondary | ICD-10-CM | POA: Insufficient documentation

## 2012-02-24 DIAGNOSIS — F10929 Alcohol use, unspecified with intoxication, unspecified: Secondary | ICD-10-CM

## 2012-02-24 DIAGNOSIS — F411 Generalized anxiety disorder: Secondary | ICD-10-CM | POA: Insufficient documentation

## 2012-02-24 DIAGNOSIS — I10 Essential (primary) hypertension: Secondary | ICD-10-CM | POA: Insufficient documentation

## 2012-02-24 DIAGNOSIS — Z79899 Other long term (current) drug therapy: Secondary | ICD-10-CM | POA: Insufficient documentation

## 2012-02-24 LAB — COMPREHENSIVE METABOLIC PANEL
AST: 38 U/L — ABNORMAL HIGH (ref 0–37)
Albumin: 4.2 g/dL (ref 3.5–5.2)
Alkaline Phosphatase: 72 U/L (ref 39–117)
BUN: 9 mg/dL (ref 6–23)
CO2: 29 mEq/L (ref 19–32)
Chloride: 100 mEq/L (ref 96–112)
Creatinine, Ser: 0.98 mg/dL (ref 0.50–1.35)
GFR calc non Af Amer: >90 mL/min (ref >90)
Potassium: 4.1 mEq/L (ref 3.5–5.1)
Total Bilirubin: 0.2 mg/dL — ABNORMAL LOW (ref 0.3–1.2)

## 2012-02-24 LAB — RAPID URINE DRUG SCREEN, HOSP PERFORMED
Amphetamines: NOT DETECTED
Barbiturates: NOT DETECTED
Benzodiazepines: NOT DETECTED

## 2012-02-24 LAB — CBC
HCT: 42.9 % (ref 39.0–52.0)
MCV: 86.8 fL (ref 78.0–100.0)
RBC: 4.94 MIL/uL (ref 4.22–5.81)
RDW: 14.2 % (ref 11.5–15.5)
WBC: 5.3 10*3/uL (ref 4.0–10.5)

## 2012-02-24 LAB — ACETAMINOPHEN LEVEL: Acetaminophen (Tylenol), Serum: <15 ug/mL (ref 10–30)

## 2012-02-24 MED ORDER — LORAZEPAM 1 MG PO TABS: 1.0000 mg | ORAL_TABLET | Freq: Four times a day (QID) | ORAL | Status: DC | PRN

## 2012-02-24 MED ORDER — ONDANSETRON HCL 4 MG PO TABS: 4.0000 mg | ORAL_TABLET | Freq: Three times a day (TID) | ORAL | Status: DC | PRN

## 2012-02-24 MED ORDER — THIAMINE HCL 100 MG/ML IJ SOLN: 100.0000 mg | Freq: Every day | INTRAMUSCULAR | Status: DC

## 2012-02-24 MED ORDER — ADULT MULTIVITAMIN W/MINERALS CH
1.0000 | ORAL_TABLET | Freq: Every day | ORAL | Status: DC
  Administered 2012-02-25: 1 via ORAL
  Filled 2012-02-24: qty 1

## 2012-02-24 MED ORDER — LORAZEPAM 1 MG PO TABS: 1.0000 mg | ORAL_TABLET | Freq: Three times a day (TID) | ORAL | Status: DC | PRN

## 2012-02-24 MED ORDER — VITAMIN B-1 100 MG PO TABS
100.0000 mg | ORAL_TABLET | Freq: Every day | ORAL | Status: DC
  Administered 2012-02-25: 100 mg via ORAL
  Filled 2012-02-24: qty 1

## 2012-02-24 MED ORDER — FOLIC ACID 1 MG PO TABS
1.0000 mg | ORAL_TABLET | Freq: Every day | ORAL | Status: DC
  Administered 2012-02-25: 1 mg via ORAL
  Filled 2012-02-24: qty 1

## 2012-02-24 MED ORDER — LORAZEPAM 2 MG/ML IJ SOLN: 1.0000 mg | Freq: Four times a day (QID) | INTRAMUSCULAR | Status: DC | PRN

## 2012-02-24 MED ORDER — NICOTINE 21 MG/24HR TD PT24
21.0000 mg | MEDICATED_PATCH | Freq: Every day | TRANSDERMAL | Status: DC
  Filled 2012-02-24: qty 1

## 2012-02-24 MED ORDER — LORAZEPAM 1 MG PO TABS: 0.0000 mg | ORAL_TABLET | Freq: Four times a day (QID) | ORAL | Status: DC

## 2012-02-24 MED ORDER — ACETAMINOPHEN 325 MG PO TABS: 650.0000 mg | ORAL_TABLET | ORAL | Status: DC | PRN

## 2012-02-24 MED ORDER — LORAZEPAM 1 MG PO TABS: 0.0000 mg | ORAL_TABLET | Freq: Two times a day (BID) | ORAL | Status: DC

## 2012-02-24 NOTE — ED Provider Notes (Signed)
History     CSN: [id number]  Arrival date & time 02/24/12  2030   First MD Initiated Contact with Patient 02/24/12 2136      Chief Complaint  Patient presents with  . Hallucinations    (Consider location/radiation/quality/duration/timing/severity/associated sxs/prior treatment) HPI Comments: History of bipolar disorder, schizophrenia presenting with hallucinations and hearing voices are "saying bad things". Denies suicidality, homicidality. His noncompliance of medications including Haldol Cogentin. Brought in by family today and they're concerned about his well-being. He denies any physical complaint. He was seen recently for chronic left hip pain.  The history is provided by the patient.    Past Medical History  Diagnosis Date  . Seizures   . Bipolar 1 disorder   . Hypertension     History reviewed. No pertinent past surgical history.  Family History  Problem Relation Age of Onset  . Cancer Brother     History  Substance Use Topics  . Smoking status: Current Everyday Smoker -- 0.2 packs/day    Types: Cigarettes  . Smokeless tobacco: Not on file  . Alcohol Use: No      Review of Systems  Constitutional: Negative for fever, activity change and appetite change.  HENT: Negative for congestion and sinus pressure.   Eyes: Negative for visual disturbance.  Respiratory: Negative for cough, chest tightness and shortness of breath.   Gastrointestinal: Negative for nausea, vomiting and abdominal pain.  Genitourinary: Negative for dysuria and hematuria.  Skin: Negative for rash.  Neurological: Negative for dizziness and headaches.  Psychiatric/Behavioral: Positive for hallucinations. Negative for suicidal ideas. The patient is nervous/anxious.     Allergies  Review of patient's allergies indicates no known allergies.  Home Medications   Current Outpatient Rx  Name Route Sig Dispense Refill  . BENZTROPINE MESYLATE 1 MG PO TABS Oral Take 1 mg by mouth daily.    Marland Kitchen  HALOPERIDOL 5 MG PO TABS Oral Take 5 mg by mouth 3 (three) times daily. 1 tablet in the morning and 2 at bedtime    . PHENYTOIN SODIUM EXTENDED 100 MG PO CAPS Oral Take 300 mg by mouth daily.    . TRAZODONE HCL 100 MG PO TABS Oral Take 100 mg by mouth at bedtime.      BP 131/82  Pulse 84  Temp(Src) 98.3 F (36.8 C) (Oral)  Resp 18  Ht 5\' 4"  (1.626 m)  Wt 141 lb 1.5 oz (64 kg)  BMI 24.22 kg/m2  SpO2 99%  Physical Exam  Constitutional: He is oriented to person, place, and time. He appears well-developed and well-nourished. No distress.  HENT:  Head: Normocephalic and atraumatic.  Mouth/Throat: Oropharynx is clear and moist. No oropharyngeal exudate.  Eyes: Conjunctivae are normal. Pupils are equal, round, and reactive to light.  Neck: Normal range of motion.  Cardiovascular: Normal rate, regular rhythm and normal heart sounds.   No murmur heard. Pulmonary/Chest: Effort normal and breath sounds normal. No respiratory distress.  Abdominal: Soft. There is no tenderness. There is no rebound and no guarding.  Musculoskeletal: Normal range of motion. He exhibits no edema and no tenderness.  Neurological: He is alert and oriented to person, place, and time. No cranial nerve deficit.  Skin: Skin is warm.    ED Course  Procedures (including critical care time)  Labs Reviewed  CBC - Abnormal; Notable for the following:    Platelets 439 (*)    All other components within normal limits  COMPREHENSIVE METABOLIC PANEL - Abnormal; Notable for the following:  Glucose, Bld 106 (*)    Total Protein 8.4 (*)    AST 38 (*)    Total Bilirubin 0.2 (*)    All other components within normal limits  ETHANOL - Abnormal; Notable for the following:    Alcohol, Ethyl (B) 417 (*)    All other components within normal limits  ACETAMINOPHEN LEVEL  URINE RAPID DRUG SCREEN (HOSP PERFORMED)   No results found.   No diagnosis found.    MDM  Hallucinations, hearing voices.  No SI or HI.  Heavy  alcohol intoxication.  Act team to evaluate.       Glynn Octave, MD 02/25/12 [phone]

## 2012-02-24 NOTE — ED Notes (Signed)
Sister/caregiver: Elayne Guerin [phone]

## 2012-02-24 NOTE — ED Notes (Addendum)
Pt reports hearing voices "that are saying bad thing"; pt denies SI and HI but reports he has not been taking medications; sister is at bedside and desire pt to be evaluated for IVC.

## 2012-02-24 NOTE — ED Notes (Signed)
Lab called to report critical ETOH level of 417. Dr. Bebe Shaggy notified.

## 2012-02-24 NOTE — ED Notes (Signed)
All belongings sent home with sister

## 2012-02-24 NOTE — BH Assessment (Signed)
Pt too intoxicated to be assessed. ETOH is 417. Writer will attempt to assess am 02/25/12

## 2012-02-25 ENCOUNTER — Encounter (HOSPITAL_COMMUNITY): Payer: Self-pay | Admitting: *Deleted

## 2012-02-25 ENCOUNTER — Inpatient Hospital Stay (HOSPITAL_COMMUNITY)
Admission: RE | Admit: 2012-02-25 | Discharge: 2012-02-29 | DRG: 885 | Disposition: A | Discharge status: Home / Self Care | Payer: Medicaid Other | Source: Ambulatory Visit | Attending: Psychiatry | Admitting: Psychiatry

## 2012-02-25 DIAGNOSIS — F259 Schizoaffective disorder, unspecified: Principal | ICD-10-CM

## 2012-02-25 DIAGNOSIS — F250 Schizoaffective disorder, bipolar type: Secondary | ICD-10-CM

## 2012-02-25 DIAGNOSIS — G40909 Epilepsy, unspecified, not intractable, without status epilepticus: Secondary | ICD-10-CM

## 2012-02-25 DIAGNOSIS — F17200 Nicotine dependence, unspecified, uncomplicated: Secondary | ICD-10-CM

## 2012-02-25 DIAGNOSIS — I10 Essential (primary) hypertension: Secondary | ICD-10-CM

## 2012-02-25 DIAGNOSIS — F319 Bipolar disorder, unspecified: Secondary | ICD-10-CM

## 2012-02-25 DIAGNOSIS — F1010 Alcohol abuse, uncomplicated: Secondary | ICD-10-CM

## 2012-02-25 DIAGNOSIS — Z79899 Other long term (current) drug therapy: Secondary | ICD-10-CM

## 2012-02-25 LAB — ETHANOL: Alcohol, Ethyl (B): 136 mg/dL — ABNORMAL HIGH (ref 0–11)

## 2012-02-25 MED ORDER — HALOPERIDOL 5 MG PO TABS
5.0000 mg | ORAL_TABLET | Freq: Three times a day (TID) | ORAL | Status: DC
  Administered 2012-02-25 (×2): 5 mg via ORAL
  Filled 2012-02-25 (×2): qty 1

## 2012-02-25 MED ORDER — PHENYTOIN SODIUM EXTENDED 100 MG PO CAPS
300.0000 mg | ORAL_CAPSULE | Freq: Every day | ORAL | Status: DC
  Administered 2012-02-25: 300 mg via ORAL
  Filled 2012-02-25: qty 3

## 2012-02-25 MED ORDER — TRAZODONE HCL 100 MG PO TABS: 100.0000 mg | ORAL_TABLET | Freq: Every day | ORAL | Status: DC

## 2012-02-25 MED ORDER — BENZTROPINE MESYLATE 1 MG PO TABS
1.0000 mg | ORAL_TABLET | Freq: Every day | ORAL | Status: DC
Start: 2012-02-25 — End: 2012-02-25
  Administered 2012-02-25: 1 mg via ORAL
  Filled 2012-02-25: qty 1

## 2012-02-25 MED ORDER — PHENYTOIN SODIUM EXTENDED 100 MG PO CAPS
300.0000 mg | ORAL_CAPSULE | Freq: Every day | ORAL | Status: DC
  Administered 2012-02-26 – 2012-02-29 (×4): 300 mg via ORAL
  Filled 2012-02-25 (×5): qty 3

## 2012-02-25 MED ORDER — ACETAMINOPHEN 325 MG PO TABS: 650.0000 mg | ORAL_TABLET | Freq: Four times a day (QID) | ORAL | Status: DC | PRN

## 2012-02-25 MED ORDER — BENZTROPINE MESYLATE 1 MG/ML IJ SOLN
1.0000 mg | Freq: Once | INTRAMUSCULAR | Status: AC
  Administered 2012-02-25: 1 mg via INTRAMUSCULAR

## 2012-02-25 MED ORDER — ALUM & MAG HYDROXIDE-SIMETH 200-200-20 MG/5ML PO SUSP: 30.0000 mL | ORAL | Status: DC | PRN

## 2012-02-25 MED ORDER — MAGNESIUM HYDROXIDE 400 MG/5ML PO SUSP: 30.0000 mL | Freq: Every day | ORAL | Status: DC | PRN

## 2012-02-25 MED ORDER — BENZTROPINE MESYLATE 1 MG PO TABS
1.0000 mg | ORAL_TABLET | Freq: Every day | ORAL | Status: DC
  Administered 2012-02-26: 1 mg via ORAL
  Filled 2012-02-25: qty 1

## 2012-02-25 MED ORDER — TRAZODONE HCL 100 MG PO TABS
100.0000 mg | ORAL_TABLET | Freq: Every evening | ORAL | Status: DC | PRN
  Administered 2012-02-25 – 2012-02-27 (×2): 100 mg via ORAL
  Filled 2012-02-25 (×2): qty 1

## 2012-02-25 MED ORDER — BENZTROPINE MESYLATE 1 MG/ML IJ SOLN
INTRAMUSCULAR | Status: AC
  Administered 2012-02-25: 1 mg via INTRAMUSCULAR
  Filled 2012-02-25: qty 2

## 2012-02-25 NOTE — ED Notes (Signed)
Pt does not have any belongings 

## 2012-02-25 NOTE — ED Notes (Signed)
PT sister contacted and will take him clothes to [facility]

## 2012-02-25 NOTE — ED Notes (Signed)
Lab in to draw

## 2012-02-25 NOTE — ED Notes (Signed)
Dr Freida Busman updated--restart home seizure medication and repeat ETOH--VORB DR Freida Busman

## 2012-02-25 NOTE — ED Notes (Signed)
Sister's cell numbers Sherleen--[phone]

## 2012-02-25 NOTE — ED Notes (Signed)
Pt's sisters are here to see.  POA papers copied and on the chart

## 2012-02-25 NOTE — ED Notes (Signed)
[facility] will call back for report 

## 2012-02-25 NOTE — ED Notes (Signed)
Up to the bathroom 

## 2012-02-25 NOTE — BH Assessment (Signed)
Assessment Note   [name] is an 36 y.o. male who presented to [facility] Emergency Department with the chief complaint of depression and experiencing auditory hallucinations. Patient was observed by writer to be pleasant and engaging during assessment. Patient reported that he had a "nervous breakdown" yesterday due to the passing of his brother. "We were very close and it just hit me hard." Patient stated that he has also been coping with the death of his mother as well and that it has overwhelmed him. Patient currently resides with his sister and defines her as a part of his support system. Patient has a noted diagnosis of Bipolar 1 Disorder and endorses hearing voices for the past 11 years. "I hear them everyday but it's been bad for the past couple of days." Patient stated that the voices have different tones and that they do not command him to do any specific behavior/request. Patient reported to writer that he has been off his seizure medication for 2months because he was unable to afford them. During assessment patient exhibited tearfulness when he discussed the passing of his mother and brother. "She died right in front of my eyes. I'll never forget that." Patient was observed to endorse depressive symptoms and verbalized to writer that his last occurrence of auditory hallucinations happened prior to his transport to the hospital. "I heard my brother's voice. It's just too much." Patient was observed to be alert and oriented during assessment.     Axis I: Bipolar, Depressed Axis II: Deferred Axis III:  Past Medical History  Diagnosis Date  . Seizures   . Bipolar 1 disorder   . Hypertension    Axis IV: problems with access to health care services Axis V: 31-40 impairment in reality testing  Past Medical History:  Past Medical History  Diagnosis Date  . Seizures   . Bipolar 1 disorder   . Hypertension     History reviewed. No pertinent past surgical history.  Family History:    Family History  Problem Relation Age of Onset  . Cancer Brother     Social History:  reports that he has been passively smoking Cigarettes.  He has been smoking about .25 packs per day. He does not have any smokeless tobacco history on file. He reports that he drinks alcohol. He reports that he does not use illicit drugs.  Additional Social History:  Alcohol / Drug Use History of alcohol / drug use?: Yes Substance #1 Name of Substance 1: ETOH 1 - Age of First Use: 22 1 - Amount (size/oz): varies 1 - Frequency: occasionally 1 - Duration: years 1 - Last Use / Amount: 4 drinks of gin. Oz unknown by pt Allergies: No Known Allergies  Home Medications:  Medications Prior to Admission  Medication Dose Route Frequency Provider Last Rate Last Dose  . acetaminophen (TYLENOL) tablet 650 mg  650 mg Oral Q4H PRN Glynn Octave, MD      . benztropine (COGENTIN) tablet 1 mg  1 mg Oral Daily Toy Baker, MD   1 mg at 02/25/12 0957  . folic acid (FOLVITE) tablet 1 mg  1 mg Oral Daily Glynn Octave, MD   1 mg at 02/25/12 0957  . haloperidol (HALDOL) tablet 5 mg  5 mg Oral TID Toy Baker, MD   5 mg at 02/25/12 0957  . LORazepam (ATIVAN) tablet 1 mg  1 mg Oral Q6H PRN Glynn Octave, MD       Or  . LORazepam (ATIVAN) injection  1 mg  1 mg Intravenous Q6H PRN Glynn Octave, MD      . LORazepam (ATIVAN) tablet 0-4 mg  0-4 mg Oral Q6H Glynn Octave, MD       Followed by  . LORazepam (ATIVAN) tablet 0-4 mg  0-4 mg Oral Q12H Glynn Octave, MD      . LORazepam (ATIVAN) tablet 1 mg  1 mg Oral Q8H PRN Glynn Octave, MD      . mulitivitamin with minerals tablet 1 tablet  1 tablet Oral Daily Glynn Octave, MD   1 tablet at 02/25/12 0957  . nicotine (NICODERM CQ - dosed in mg/24 hours) patch 21 mg  21 mg Transdermal Daily Glynn Octave, MD      . ondansetron [facility]) tablet 4 mg  4 mg Oral Q8H PRN Glynn Octave, MD      . phenytoin (DILANTIN) ER capsule 300 mg  300 mg Oral Daily  Toy Baker, MD   300 mg at 02/25/12 0957  . thiamine (VITAMIN B-1) tablet 100 mg  100 mg Oral Daily Glynn Octave, MD   100 mg at 02/25/12 0957   Or  . thiamine (B-1) injection 100 mg  100 mg Intravenous Daily Glynn Octave, MD      . traZODone (DESYREL) tablet 100 mg  100 mg Oral QHS Toy Baker, MD       Medications Prior to Admission  Medication Sig Dispense Refill  . phenytoin (DILANTIN) 100 MG ER capsule Take 300 mg by mouth daily.        OB/GYN Status:  No LMP for male patient.  General Assessment Data Location of Assessment: [facility] ED Living Arrangements: Family members (Sister ) Can pt return to current living arrangement?: Yes Is patient capable of signing voluntary admission?: Yes Transfer from: Home Referral Source: Self/Family/Friend     Risk to self Suicidal Ideation: No Suicidal Intent: No Is patient at risk for suicide?: No Suicidal Plan?: No Access to Means: Yes Specify Access to Suicidal Means: Access to streets What has been your use of drugs/alcohol within the last 12 months?: ETOH Previous Attempts/Gestures: No How many times?: 0  Triggers for Past Attempts: None known Intentional Self Injurious Behavior: None Family Suicide History: No Recent stressful life event(s): Loss (Comment) (Death of mother and brother ) Persecutory voices/beliefs?: No Depression: Yes Depression Symptoms: Tearfulness Substance abuse history and/or treatment for substance abuse?: No (Pt denies )  Risk to Others Homicidal Ideation: No Thoughts of Harm to Others: No Current Homicidal Intent: No Current Homicidal Plan: No Access to Homicidal Means: No Identified Victim: None reported History of harm to others?: No (Pt denies) Assessment of Violence: None Noted Does patient have access to weapons?: No Criminal Charges Pending?: Yes (Pt urinated outside of bus station) Describe Pending Criminal Charges: Pt urinated outside of bus station (Pt unable to recall the  exact charge) Does patient have a court date: Yes Court Date: 03/18/12  Psychosis Hallucinations: Auditory  Mental Status Report Appear/Hygiene: Disheveled Eye Contact: Fair Motor Activity: Freedom of movement Speech: Logical/coherent Level of Consciousness: Alert Mood: Depressed Affect: Depressed;Appropriate to circumstance Anxiety Level: None Thought Processes: Coherent;Relevant Judgement: Impaired Orientation: Person;Place;Time;Situation Obsessive Compulsive Thoughts/Behaviors: None  Cognitive Functioning Concentration: Decreased Memory: Recent Intact;Remote Intact IQ: Average Insight: Fair Impulse Control: Poor Appetite: Poor Weight Loss: 0  Weight Gain: 0  Sleep: Decreased ("little to none" per pt) Vegetative Symptoms: None  Prior Inpatient Therapy Prior Inpatient Therapy: Yes Prior Therapy Dates:  (Pt was unable to recall  time period of inpatient treatment) Prior Therapy Facilty/Provider(s): Nino Parsley, [facility] Reason for Treatment: AVH  Prior Outpatient Therapy Prior Outpatient Therapy: No (Pt denies receiving outpatient services)  ADL Screening (condition at time of admission) Patient's cognitive ability adequate to safely complete daily activities?: Yes Patient able to express need for assistance with ADLs?: Yes Independently performs ADLs?: Yes Weakness of Legs: None Weakness of Arms/Hands: None  Home Assistive Devices/Equipment Home Assistive Devices/Equipment: None  Therapy Consults (therapy consults require a physician order) PT Evaluation Needed: No OT Evalulation Needed: No Abuse/Neglect Assessment (Assessment to be complete while patient is alone) Physical Abuse: Denies Verbal Abuse: Denies Sexual Abuse: Denies Exploitation of patient/patient's resources: Denies Self-Neglect: Denies Values / Beliefs Cultural Requests During Hospitalization: None Spiritual Requests During Hospitalization: None Consults Spiritual Care Consult Needed:  No Social Work Consult Needed: No      Additional Information 1:1 In Past 12 Months?: No CIRT Risk: No Elopement Risk: No Does patient have medical clearance?: Yes     Disposition: Referral to [facility] for inpatient treatment Disposition Disposition of Patient: Inpatient treatment program Type of inpatient treatment program: Adult  On Site Evaluation by: Self  Reviewed with Physician:     Paulino Door, Malcolm Quast C 02/25/2012 10:40 AM

## 2012-02-25 NOTE — ED Notes (Signed)
CSW-Greg into see

## 2012-02-25 NOTE — ED Notes (Signed)
Pt's sister called and report that she is POA and will bring the papers up to put on the chart

## 2012-02-25 NOTE — [facility] Counselor (Signed)
Pt accepted to [facility] by Aggie NP to Dr. Harvie Heck Readling.  Pt going to Room 400-2.  Dr Freida Busman is in agreement with dispo.

## 2012-02-25 NOTE — Tx Team (Signed)
Initial Interdisciplinary Treatment Plan  PATIENT STRENGTHS: (choose at least two) Motivation for treatment/growth Supportive family/friends  PATIENT STRESSORS: Loss of mother, sister and brother* Substance abuse   PROBLEM LIST: Problem List/Patient Goals Date to be addressed Date deferred Reason deferred Estimated date of resolution  etoh abuse/dependence 02-25-12           Active auditory/visual hallucination 02-25-12           homicdal ideation with no plans 02-25-12     9 no specific indiviual                         DISCHARGE CRITERIA:  Improved stabilization in mood, thinking, and/or behavior Verbal commitment to aftercare and medication compliance Withdrawal symptoms are absent or subacute and managed without 24-hour nursing intervention  PRELIMINARY DISCHARGE PLAN: Attend 12-step recovery group Return to previous living arrangement  PATIENT/FAMIILY INVOLVEMENT: This treatment plan has been presented to and reviewed with the patient, Ardian Haberland, and/or family member, .  The patient and family have been given the opportunity to ask questions and make suggestions.  Valente David 02/25/2012, 6:55 PM

## 2012-02-25 NOTE — Progress Notes (Signed)
Patient ID: [name], male   DOB: [birth year], 36 y.o.   MRN: [id number] 02-25-19-13 @ 1854 nursing adm note: pt came in as a voluntary admission.  He has been abusing etoh as well as having auditory and visual hallucinations. He denied any suicide ideation but was having some hi with no plan. He was able to contract for the hi. He denied any pain, no allergies, and is a fall risk due to seizures. He smokes but refused the nicotine patch.  His last seizure was about 1 year ago. He has a medial hx of bipolar I and htn. He denied any illegal drug use and stated he has been in prison for x2 dui's. His labs were wnl except platelets were 439 and elevated livers. His ciwa was a 5. He had some n/v on adm was given ginger ale and went to bed once in his room and went to sleep.  He has 3 children, is single and lives with sister. Pt has had several losses due to death; sister, brother and mother. This pt was polite/cooperative and was escorted to the 400 hall. Report was given to sue,rn.  Emergency contacts: gwen wilson sister at cell ph # [phone] or sister charlene Huot at cell # [phone]

## 2012-02-26 DIAGNOSIS — F250 Schizoaffective disorder, bipolar type: Secondary | ICD-10-CM | POA: Diagnosis present

## 2012-02-26 MED ORDER — SERTRALINE HCL 50 MG PO TABS
50.0000 mg | ORAL_TABLET | Freq: Every day | ORAL | Status: DC
  Administered 2012-02-26 – 2012-02-29 (×4): 50 mg via ORAL
  Filled 2012-02-26 (×5): qty 1

## 2012-02-26 MED ORDER — BENZTROPINE MESYLATE 1 MG PO TABS
ORAL_TABLET | ORAL | Status: AC
  Filled 2012-02-26: qty 1

## 2012-02-26 MED ORDER — BENZTROPINE MESYLATE 1 MG PO TABS: 1.0000 mg | ORAL_TABLET | Freq: Two times a day (BID) | ORAL | Status: DC | PRN

## 2012-02-26 MED ORDER — RISPERIDONE 1 MG PO TABS
1.0000 mg | ORAL_TABLET | ORAL | Status: DC
  Administered 2012-02-26 – 2012-02-27 (×3): 1 mg via ORAL
  Filled 2012-02-26 (×5): qty 1

## 2012-02-26 MED ORDER — BENZTROPINE MESYLATE 1 MG PO TABS
1.0000 mg | ORAL_TABLET | ORAL | Status: DC
  Administered 2012-02-26 – 2012-02-29 (×6): 1 mg via ORAL
  Filled 2012-02-26 (×7): qty 1

## 2012-02-26 NOTE — Progress Notes (Signed)
02/26/2012 Nursing 1245 D [name] is seen OOB UAL on the 400 hall tolerated fair. HE is quiet. A little paranoid. HE shares that he has been hearing voices since he was " 36 years old". He is compliant with his medications and he has denied the need for prn meds today. He does not make eye contact with this nurse nor does he complete his self inventory, despite being encouraged by this writer to do so.  R Safety is maintained and POC includes MD ordering PTA meds today. Cont to foster therapeutic relationship already establisehd PD [facility]

## 2012-02-26 NOTE — Progress Notes (Signed)
Pt observed in bed awake in his room.  He reports he is feeling ok at this time.  He reports that he is hearing voices, but they are not command nor are they disturbing in nature.  Discussed pt's meds ordered for the night.  He was concerned that he would be given Haldol which was given in the ED.  He had a reaction to Haldol at that time.  Assured pt he was not receiving Haldol tonight.  Encouraged pt to report any concerns to staff as he would be checked on q15 minutes.  Pt voiced understanding.  Pt denies SI/HI.  Safety maintained with q15 minute checks.

## 2012-02-26 NOTE — Progress Notes (Signed)
Writer attempted to meet with patient who advised he was getting lock jaw and needed medication.  Patient shared he had advised RN.  Writer spoke with MD regarding patient and RN was requesting medication.

## 2012-02-26 NOTE — Progress Notes (Signed)
Patient ID: [name], male   DOB: [birth year], 36 y.o.   MRN: [id number] After arriving and being admitted, was c/o the feeling of tightness in his mouth and jaw.Previous RN had notified the NP on call, obtained order for cogentin 1 mg IM and was given at 1940.  After receiving report, pt was reassessed and stated he was feeling better, denied the muscle tightness, denied problems with swallowing or breathing.  Stated he was feeling better, no other c/o's voiced at this time.Marland Kitchen Has been resting in bed quietly with eyes closed, little to no interaction with roommate.  Pt was moved to another room further down the hall d/t roommate frequent talking and activity , wheelchair use, etc.  Will continue to monitor.

## 2012-02-26 NOTE — H&P (Signed)
Psychiatric Admission Assessment Adult  Patient Identification:  [name] Date of Evaluation:  02/26/2012 Chief Complaint:  BIPOLAR D/O,NOS  History of Present Illness:: This is a 36 year old African-American male. Patient appears younger than stated age. He is being admitted to [facility] from the [facility] ED with complaints of auditory hallucinations. Patient reports, "My brother passed away 2 months ago from cancer right in front of me. I will never forget that day.  My mother also passed away 2 years prior to my brothers death. My sister also died right after my mother. It hurts very badly. I love my brother very much. We were very close and I miss him a lot. But lately, I have been hearing my brother's voice. The more I hear his voice, the more sad I get because I recognize his voice, but I can't see him. I started hearing voices since I was 36 years old. But I don't see stuff. I have been to several hospitals because of this voice hearing. [facility] in the past. I had also been to [name] and [facility]. I have been spitting up my food after eating. It happened to me again this morning after breakfast. I have not been on my medications in 6 months, including my seizure medication "  Mood Symptoms:  Depression, Sadness, Depression Symptoms:  depressed mood, (Hypo) Manic Symptoms:  Distractibility, "the voices distract me a lot" Anxiety Symptoms:  Excessive Worry, Psychotic Symptoms:  Hallucinations: Auditory  PTSD Symptoms: Had a traumatic exposure:  Denies any traumatic events in his life.  Past Psychiatric History: Diagnosis: Schizoaffective disorder,  Hospitalizations: [facility]  Outpatient Care: [facility]  Substance Abuse Care: None reported  Self-Mutilation: None reported  Suicidal Attempts: None reported  Violent Behaviors: None reported   Past Medical History:   Past Medical History  Diagnosis Date  .  Seizures   . Bipolar 1 disorder   . Hypertension    Loss of Consciousness:  None reported Seizure History:  "Yes, I have seizure disorder" Cardiac History:  Hypertension? BP on receord Traumatic Brain Injury:  None reported   Allergies:  No Known Allergies PTA Medications: Prescriptions prior to admission  Medication Sig Dispense Refill  . benztropine (COGENTIN) 1 MG tablet Take 1 mg by mouth daily.      . haloperidol (HALDOL) 5 MG tablet Take 5 mg by mouth 3 (three) times daily. 1 tablet in the morning and 2 at bedtime      . phenytoin (DILANTIN) 100 MG ER capsule Take 300 mg by mouth daily.      . traZODone (DESYREL) 100 MG tablet Take 100 mg by mouth at bedtime.        Previous Psychotropic Medications:  Medication/Dose  See lists               Substance Abuse History in the last 12 months: Substance Age of 1st Use Last Use Amount Specific Type  Nicotine 20 Prior to hosp 2-3 cigarettes daily Cigarettes  Alcohol 22 "I drink occasionally" 1-2 occasionally Beer  Cannabis Denies use     Opiates Denies use     Cocaine Denies use     Methamphetamines Denies use     LSD Denies use     Ecstasy Denies use     Benzodiazepines Denies use     Caffeine Denies use     Inhalants      Others:  Consequences of Substance Abuse: Medical Consequences:  Liver damage, Possible death by overdose Legal Consequences:  Arrests, Jail times, Loss of driving privilege Family Consequences:  Family discord Withdrawal Symptoms:   None  Social History: Current Place of Residence: [place] Place of Birth:  [place] Family Members: "My 3 children" Marital Status:  Single Children:3  Sons:1  Daughters:2 Relationships:"With my children" Education:  "I dropped out of school on the 9th grade" Educational Problems/Performance: "I did not complete high school" Religious Beliefs/Practices: None reported History of Abuse (Emotional/Phsycial/Sexual): None  reported Occupational Experiences: Unemployed Military History:  None. Legal History: None reported Hobbies/Interests: None reported  Family History:   Family History  Problem Relation Age of Onset  . Cancer Brother     Mental Status Examination/Evaluation: Objective:  Appearance: Casual, appears younger than stated age  Eye Contact::  Good  Speech:  Clear and Coherent  Volume:  Normal  Mood:  Euthymic  Affect:  Appropriate  Thought Process:  Coherent  Orientation:  Full  Thought Content:  Hallucinations: Auditory  Suicidal Thoughts:  No  Homicidal Thoughts:  No  Memory:  Immediate;   Good Recent;   Good Remote;   Good  Judgement:  Impaired  Insight:  Fair  Psychomotor Activity:  Normal  Concentration:  Fair  Recall:  Good  Akathisia:  No  Handed:  Right  AIMS (if indicated):     Assets:  Desire for Improvement  Sleep:  Number of Hours: 6     Laboratory/X-Ray: None Psychological Evaluation(s)      Assessment:    AXIS I:  Schizoaffective Disorder AXIS II:  Deferred AXIS III:   Past Medical History  Diagnosis Date  . Seizures   . Bipolar 1 disorder   . Hypertension    AXIS IV:  economic problems, educational problems, housing problems, occupational problems and other psychosocial or environmental problems AXIS V:  41-50 serious symptoms  Treatment Plan/Recommendations: Admit for safety and stabilization.                                                                Review and reinstate any pertinent home medications.  Treatment Plan Summary: Daily contact with patient to assess and evaluate symptoms and progress in treatment Medication management Current Medications:  Current Facility-Administered Medications  Medication Dose Route Frequency Provider Last Rate Last Dose  . acetaminophen (TYLENOL) tablet 650 mg  650 mg Oral Q6H PRN Sanjuana Kava, NP      . alum & mag hydroxide-simeth (MAALOX/MYLANTA) 200-200-20 MG/5ML suspension 30 mL  30 mL Oral Q4H  PRN Sanjuana Kava, NP      . benztropine (COGENTIN) tablet 1 mg  1 mg Oral Daily Sanjuana Kava, NP   1 mg at 02/26/12 0809  . benztropine mesylate (COGENTIN) injection 1 mg  1 mg Intramuscular Once Sanjuana Kava, NP   1 mg at 02/25/12 1940  . magnesium hydroxide (MILK OF MAGNESIA) suspension 30 mL  30 mL Oral Daily PRN Sanjuana Kava, NP      . phenytoin (DILANTIN) ER capsule 300 mg  300 mg Oral Daily Sanjuana Kava, NP   300 mg at 02/26/12 0809  . traZODone (DESYREL) tablet 100 mg  100 mg Oral QHS PRN Sanjuana Kava, NP  100 mg at 02/25/12 2356   Facility-Administered Medications Ordered in Other Encounters  Medication Dose Route Frequency Provider Last Rate Last Dose  . DISCONTD: acetaminophen (TYLENOL) tablet 650 mg  650 mg Oral Q4H PRN Glynn Octave, MD      . DISCONTD: benztropine (COGENTIN) tablet 1 mg  1 mg Oral Daily Toy Baker, MD   1 mg at 02/25/12 0957  . DISCONTD: folic acid (FOLVITE) tablet 1 mg  1 mg Oral Daily Glynn Octave, MD   1 mg at 02/25/12 0957  . DISCONTD: haloperidol (HALDOL) tablet 5 mg  5 mg Oral TID Toy Baker, MD   5 mg at 02/25/12 1643  . DISCONTD: LORazepam (ATIVAN) injection 1 mg  1 mg Intravenous Q6H PRN Glynn Octave, MD      . DISCONTD: LORazepam (ATIVAN) tablet 0-4 mg  0-4 mg Oral Q6H Glynn Octave, MD      . DISCONTD: LORazepam (ATIVAN) tablet 0-4 mg  0-4 mg Oral Q12H Glynn Octave, MD      . DISCONTD: LORazepam (ATIVAN) tablet 1 mg  1 mg Oral Q8H PRN Glynn Octave, MD      . DISCONTD: LORazepam (ATIVAN) tablet 1 mg  1 mg Oral Q6H PRN Glynn Octave, MD      . DISCONTD: mulitivitamin with minerals tablet 1 tablet  1 tablet Oral Daily Glynn Octave, MD   1 tablet at 02/25/12 0957  . DISCONTD: nicotine (NICODERM CQ - dosed in mg/24 hours) patch 21 mg  21 mg Transdermal Daily Glynn Octave, MD      . DISCONTD: ondansetron (ZOFRAN) tablet 4 mg  4 mg Oral Q8H PRN Glynn Octave, MD      . DISCONTD: phenytoin (DILANTIN) ER capsule 300 mg   300 mg Oral Daily Toy Baker, MD   300 mg at 02/25/12 0957  . DISCONTD: thiamine (B-1) injection 100 mg  100 mg Intravenous Daily Glynn Octave, MD      . DISCONTD: thiamine (VITAMIN B-1) tablet 100 mg  100 mg Oral Daily Glynn Octave, MD   100 mg at 02/25/12 0957  . DISCONTD: traZODone (DESYREL) tablet 100 mg  100 mg Oral QHS Toy Baker, MD        Observation Level/Precautions:  Q 15 minutes checks for safety  Laboratory:  Reviewed and noted ED lab findings on file.  Psychotherapy:  Group  Medications:  See lists  Routine PRN Medications:  Yes  Consultations:  None indicated  Discharge Concerns:  Staying on medications  Other:     Sanjuana Kava 3/4/20139:37 AM

## 2012-02-26 NOTE — Progress Notes (Signed)
[facility] Group Notes:  (Counselor/Nursing/MHT/Case Management/Adjunct)  02/26/2012 2:14 PM  Type of Therapy:  Group Therapy  Participation Level:  Minimal  Participation Quality:  Attentive and Sharing  Affect:  Depressed  Cognitive:  Oriented  Insight:  Limited  Engagement in Group:  Limited  Engagement in Therapy:  Limited  Modes of Intervention:  Clarification, Education and Support  Summary of Progress/Problems: Patient came into group late and then was taken out to see doctor. He stated that he was here for the same reasons as last time. He talked about his brother's recent death and his sister and mother's deaths 2 years ago. Having difficulty coping. Lives with his sister who is supportive. Hearing voices. Hasn't not taken medications as prescribed especially his seizure medications.  Kassadee Carawan, [name] 02/26/2012, 2:14 PM

## 2012-02-26 NOTE — [facility] Suicide Risk Assessment (Signed)
Suicide Risk Assessment  Admission Assessment     Demographic factors:  Assessment Details Time of Assessment: Admission Information Obtained From: Patient Current Mental Status:  Current Mental Status:  (pt having auditory/visual hallucinations) Loss Factors:  Loss Factors:  (recent multiple deaths in his family brother, sister,mother) Historical Factors:  Historical Factors: Family history of mental illness or substance abuse;Impulsivity Risk Reduction Factors:  Risk Reduction Factors: Sense of responsibility to family;Living with another person, especially a relative  CLINICAL FACTORS:   Depression:   Anhedonia Comorbid alcohol abuse/dependence Insomnia Severe Alcohol/Substance Abuse/Dependencies More than one psychiatric diagnosis Currently Psychotic Previous Psychiatric Diagnoses and Treatments Schizoaffective Disorder - Bipolar Type.  COGNITIVE FEATURES THAT CONTRIBUTE TO RISK:  None Noted.    Diagnosis:  Axis I: Schizoaffective Disorder - Bipolar Type.  Alcohol Abuse.  The patient was seen today and reports the following:   ADL's: Intact.  Sleep: The patient reports to having some difficulty initiating and maintaining sleep.  Appetite: The patient reports a good appetite but reports to experiencing nausea and vomiting after meals.   Mild>(1-10) >Severe  Hopelessness (1-10): 3-4  Depression (1-10): 8  Anxiety (1-10): 3   Suicidal Ideation: The patient denies any suicidal ideations today.  Plan: No.  Intent: No.  Means: No.   Homicidal Ideation: The patient adamantly denies any homicidal ideations.  Plan: No  Intent: No.  Means: No   General Appearance /Behavior: Casual and cooperative today.  Eye Contact: Good.  Speech: Appropriate in rate and volume with no pressuring noted.  Motor Behavior: Appropriate.  Level of Consciousness: Alert and Oriented x 3.  Mental Status: Alert and Oriented x 3.  Mood: Severely Depressed.  Affect: Moderately Constricted.    Anxiety Level: Mild anxiety reported today.  Thought Process: Auditory hallucinations reported.  Thought Content: The patient reports auditory hallucinations today but no visual hallucinations or delusional thinking. Perception:. Auditory hallucinations reported.  Judgment: Fair to Good.  Insight: Fair to Good.  Cognition: Oriented to time, place and person.   Time was spent today discussing with the patient his current symptoms. The patient states his brother died 2 months ago and since that time he has heard his brother's voice.  He states that he also hears other voices and has been treated in the past for this.  However, the patient states that he has been off of his psychiatric medications for at least 6 months.  He also reports a seizure disorder x 2 years and has been off of his seizure medications.  Treatment Plan Summary:  1. Daily contact with patient to assess and evaluate symptoms and progress in treatment  2. Medication management  3. The patient will deny suicidal ideations or homicidal ideations for 48 hours prior to discharge and have a depression and anxiety rating of 3 or less. The patient will also deny any auditory or visual hallucinations or delusional thinking.  4. The patient will deny any symptoms of substance withdrawal at time of discharge.   Plan:  1. Will continue the patient's current medications.  2. Will start the medication Risperdal 1 mg po q am and hs for psychosis. 3. Will start Zoloft at 50 mgs po qhs for depression.  4. Will start Cogentin 1 mg po q am and hs for EPS.  5. Laboratory studies reviewed. 6. Will continue to monitor.  SUICIDE RISK:   Minimal: No identifiable suicidal ideation.  Patients presenting with no risk factors but with morbid ruminations; may be classified as minimal risk based  on the severity of the depressive symptoms  [name] 02/26/2012, 1:11 PM

## 2012-02-27 DIAGNOSIS — F259 Schizoaffective disorder, unspecified: Principal | ICD-10-CM

## 2012-02-27 MED ORDER — RISPERIDONE 2 MG PO TABS
2.0000 mg | ORAL_TABLET | ORAL | Status: DC
  Administered 2012-02-27 – 2012-02-29 (×4): 2 mg via ORAL
  Filled 2012-02-27 (×6): qty 1

## 2012-02-27 NOTE — Progress Notes (Signed)
Pt attended discharge planning group and actively participated.  SW met with pt at this time.  Pt presents with calm mood and affect.  Pt states that he was feeling sick and decided to come to the hospital.  SW asked pt what he meant by sick and pt explained that he hears voices.  Pt states that his brother died 2 months ago from cancer and he hears his voice.  Pt states that he isn't hearing any voices at this time.  Pt states that he lives with his sister who is his power of attorney.  Pt states he lives in [place] and his sister can pick him up when d/c.  Pt states that he goes to [facility] for medication management and therapy and had an appointment on 3/1 which he missed.  SW will secure pt's follow up at [facility].  No further needs at this time.    Reyes Ivan, LCSWA 02/27/2012  1:38 PM

## 2012-02-27 NOTE — Tx Team (Signed)
Interdisciplinary Treatment Plan Update (Adult)  Date:  02/27/2012  Time Reviewed:  8:30 AM   Progress in Treatment: Attending groups: Yes Participating in groups:  Yes Taking medication as prescribed: Yes Tolerating medication:  Yes Family/Significant other contact made:    Still needed, with sister who is POA Patient understands diagnosis:  Yes Discussing patient identified problems/goals with staff:  Yes Medical problems stabilized or resolved:  Yes Denies suicidal/homicidal ideation: Yes Issues/concerns per patient self-inventory:  None identified Other: N/A  New problem(s) identified: None Identified  Reason for Continuation of Hospitalization: Hallucinations Medication stabilization Other; describe   Establish follow-up for medical and mental health   Interventions implemented related to continuation of hospitalization: mood stabilization, medication monitoring and adjustment, group therapy and psycho education, safety checks q 15 mins  Additional comments: N/A  Estimated length of stay: 1-2 days  Discharge Plan: SW is assessing for appropriate referrals.  Will return to live with sister, who is POA.  New goal(s): N/A  Review of initial/current patient goals per problem list:   1.  Goal(s): Reduce depressive symptoms  Met:  No  Target date: by discharge  As evidenced by: Reducing depression from a 10 to a 3 as reported by pt. Denies depression today.  2.  Goal (s): Eliminate Suicidal Ideation  Met:  No  Target date: by discharge  As evidenced by: Eliminate suicidal ideation.  Patient denies SI today, needs to deny 48 hours.  3.  Goal(s): Reduce Psychosis  Met:  No  Target date: by discharge  As evidenced by: Reduce psychotic symptoms to baseline, as reported by pt.  Voices still heard, but softer - thought he heard a small child this morning in the hall  4.  Goal(s):  Establish health care for seizure disorder.  Met:  No  Target date: by  discharge  As evidenced by:  Needs to be arranged with [facility] and/or Health Dept to get Dilantin for seizures   Attendees: Patient:  [name]  02/27/2012 10:49 AM   Family:     Physician:  Franchot Gallo, MD 02/27/2012 8:30 AM   Nursing:   Neill Loft, RN 02/27/2012 10:56 AM   Case Manager:  Reyes Ivan, LCSWA 02/27/2012 8:30 AM   Counselor:  Veto Kemps, MT-BC 02/27/2012 8:30 AM   Other:  Ambrose Mantle, LCSW 02/27/2012 8:30 AM   Other:  Lynann Bologna, NP 02/27/2012 10:56 AM   Other:     Other:      Scribe for Treatment Team:   Carmina Miller, 02/27/2012, 8:30 AM

## 2012-02-27 NOTE — Progress Notes (Signed)
[facility] MD Progress Note  02/27/2012 3:11 PM  Diagnosis:  Axis I: Schizoaffective Disorder - Bipolar Type.  Alcohol Abuse.   The patient was seen today and reports the following:   ADL's: Intact.  Sleep: The patient reports to sleeping reasonably well last night.  Appetite: The patient reports a good appetite.   Mild>(1-10) >Severe  Hopelessness (1-10): 0  Depression (1-10): 0  Anxiety (1-10): 0   Suicidal Ideation: The patient denies any suicidal ideations today.  Plan: No.  Intent: No.  Means: No.   Homicidal Ideation: The patient adamantly denies any homicidal ideations.  Plan: No  Intent: No.  Means: No   General Appearance /Behavior: Casual and cooperative today.  Eye Contact: Good.  Speech: Appropriate in rate and volume with no pressuring noted.  Motor Behavior: Appropriate.  Level of Consciousness: Alert and Oriented x 3.  Mental Status: Alert and Oriented x 3.  Mood: Essentially Euthymic today.  Affect: Slightly Constricted.  Anxiety Level: No anxiety noted or reported.  Thought Process: Auditory hallucinations reported.  Thought Content: The patient reports auditory hallucinations today but no visual hallucinations or delusional thinking. He states the voices are diminished in severity since starting the medication Risperdal. Perception:. Auditory hallucinations reported.  Judgment: Fair to Good.  Insight: Fair to Good.  Cognition: Oriented to time, place and person.  Sleep:  Number of Hours: 3.25    Vital Signs:Blood pressure 127/82, pulse 108, temperature 98 F (36.7 C), temperature source Oral, resp. rate 17, height 5\' 5"  (1.651 m), weight 64.864 kg (143 lb), SpO2 100.00%.  Current Medications: Current Facility-Administered Medications  Medication Dose Route Frequency Provider Last Rate Last Dose  . acetaminophen (TYLENOL) tablet 650 mg  650 mg Oral Q6H PRN Sanjuana Kava, NP      . alum & mag hydroxide-simeth (MAALOX/MYLANTA) 200-200-20 MG/5ML suspension 30  mL  30 mL Oral Q4H PRN Sanjuana Kava, NP      . benztropine (COGENTIN) tablet 1 mg  1 mg Oral BH-qamhs Franchot Gallo, MD   1 mg at 02/27/12 0807  . benztropine (COGENTIN) tablet 1 mg  1 mg Oral BID PRN Franchot Gallo, MD      . magnesium hydroxide (MILK OF MAGNESIA) suspension 30 mL  30 mL Oral Daily PRN Sanjuana Kava, NP      . phenytoin (DILANTIN) ER capsule 300 mg  300 mg Oral Daily Franchot Gallo, MD   300 mg at 02/27/12 0807  . risperiDONE (RISPERDAL) tablet 2 mg  2 mg Oral BH-qamhs Thaddeaus Monica, MD      . sertraline (ZOLOFT) tablet 50 mg  50 mg Oral Daily Franchot Gallo, MD   50 mg at 02/27/12 0807  . traZODone (DESYREL) tablet 100 mg  100 mg Oral QHS PRN Sanjuana Kava, NP   100 mg at 02/25/12 2356  . DISCONTD: risperiDONE (RISPERDAL) tablet 1 mg  1 mg Oral BH-qamhs Jestine Bicknell, MD   1 mg at 02/27/12 0808   Lab Results: No results found for this or any previous visit (from the past 48 hour(s)).  Time was spent today discussing with the patient his current symptoms. The patient reports that he continues to experience auditory hallucinations but with the severity much reduced since starting the medication Risperdal.  The patient reports to specifically hearing a young child yelling for him to "get out of my life" this morning.  Otherwise, the patient reports much improvement in his symptoms and would like to be discharged soon.  Treatment Plan Summary:  1. Daily contact with patient to assess and evaluate symptoms and progress in treatment  2. Medication management  3. The patient will deny suicidal ideations or homicidal ideations for 48 hours prior to discharge and have a depression and anxiety rating of 3 or less. The patient will also deny any auditory or visual hallucinations or delusional thinking.  4. The patient will deny any symptoms of substance withdrawal at time of discharge.   Plan:  1. Will continue the patient's current medications.  2. Will increase the medication  Risperdal to 2 mg po q am and hs for psychosis.  3. Dilantin Level ordered for tomorrow morning. 4. Will continue to monitor.  [name] 02/27/2012, 3:11 PM

## 2012-02-27 NOTE — Progress Notes (Signed)
Pt states he slept well, appetite is good. Energy level is normal. Focus is good. Pt rates both depression and hopelessness at a 0. Pt denies SI/HI and pt rates physical pain as a 0. Pt's plan so far is "to stay on my meds" after discharge. Going to meals and groups.

## 2012-02-28 NOTE — Progress Notes (Signed)
Pt is sitting in the dayroom watching a movie with his peers.  He reports he has been fine today.  He says the Risperdal has decreased the voices and at this time does not hear any voices.  He voices no needs/concerns.  He reports that he may be discharged tomorrow to go home with his sister.  Encouraged to continue his meds at home.  He denies SI/HI at this time.  Safety maintained with q15 minute checks.

## 2012-02-28 NOTE — Progress Notes (Signed)
Patient ID: [name], male   DOB: 1976-09-18, 36 y.o.   MRN: [id number] Fully alert, and pleasant on approach, polite.  Was hoping to go home today, but his sister says he had told her that he was still hearing voices, and she is concerned about this because the voices get so bad sometimes.   Today [name] says he is not hearing any voices and hasn't heard any all day.  Thinks he is doing well on the medications.  Rates his depression a 6/10.  Denies SI, Denies HI, and denies any anxiety.  Says he is sleeping and eating well.    Brother recently died of Ca of stomach and was at [employer] of [address]  We discussed possible grief counseling at [facility]osp[facility] in the future.

## 2012-02-28 NOTE — Progress Notes (Signed)
[facility] Group Notes:  (Counselor/Nursing/MHT/Case Management/Adjunct)  02/28/2012 9:43 AM  Type of Therapy:  Group Therapy 02/27/12 Participation Level:  Active  Participation Quality:  Attentive and Sharing  Affect:  Depressed  Cognitive:  Oriented  Insight:  Limited  Engagement in Group:  Good  Engagement in Therapy:  Good  Modes of Intervention:  Education, Problem-solving and Support  Summary of Progress/Problems: Patient related to others as they discussed hearing voices. Stated that he continues to hear voices but they are quieter. Discussed support of sister and family.   HartisAram Beecham 02/28/2012, 9:43 AM

## 2012-02-28 NOTE — Progress Notes (Signed)
[facility] Adult Inpatient Family/Significant Other Suicide Prevention Education  Suicide Prevention Education:  Education Completed; [name] (sister) [phone]) has been identified by the patient as the family member/significant other with whom the patient will be residing, and identified as the person(s) who will aid the patient in the event of a mental health crisis (suicidal ideations/suicide attempt).  With written consent from the patient, the family member/significant other has been provided the following suicide prevention education, prior to the and/or following the discharge of the patient.  The suicide prevention education provided includes the following:  Suicide risk factors  Suicide prevention and interventions  National Suicide Hotline telephone number  [facility] assessment telephone number  [facility] Emergency Assistance 911  [facility] and/or Residential Mobile Crisis Unit telephone number  Request made of family/significant other to:  Remove weapons (e.g., guns, rifles, knives), all items previously/currently identified as safety concern.    Remove drugs/medications (over-the-counter, prescriptions, illicit drugs), all items previously/currently identified as a safety concern.  The family member/significant other verbalizes understanding of the suicide prevention education information provided.  The family member/significant other agrees to remove the items of safety concern listed above.  Patient was not admitted with suicidal thoughts but due to risk factors, counselor talked to sister about suicide prevention. She stated that he had told her that he was having suicidal and homicidal thoughts, particularly related to multiple family deaths and the way his life was going. She has no safety concerns but would like to make sure that the voices are gone because he spends a lot of time talking to the voices. [name] 02/28/2012, 9:23 AM

## 2012-02-28 NOTE — [facility] Counselor (Signed)
Adult Comprehensive Assessment  Patient ID: [name], male   DOB: 11/26/1976, 36 y.o.   MRN: [id number]  Information Source:    Current Stressors:  Educational / Learning stressors: 8th grade education, patient does not read or write Employment / Job issues: unemployed, just got disability Family Relationships: supportive family but multiple deaths in his family the last 2 years Surveyor, quantity / Lack of resources (include bankruptcy): no income until recently when getting medicaid. He will 2 years retroactive Housing / Lack of housing: lives with sister Physical health (include injuries & life threatening diseases): high blood pressure and seizures Social relationships: no problems reported Substance abuse: minimizes recent use however has 2 DWI's and served 8-9 months in prison about 1 1/2 years ago Bereavement / Loss: mother died 2 years ago, sister died 66 days after his mother, and brother recently died 2 months ago.  Living/Environment/Situation:  Living Arrangements: Family members (sister and her children) Living conditions (as described by patient or guardian): good, has his own room How long has patient lived in current situation?: 4 1/2-5 years What is atmosphere in current home: Comfortable  Family History:  Marital status: Single Does patient have children?: Yes How many children?: 3  (2 daughters-13,12 and 41 son-79 years old) How is patient's relationship with their children?: good, he has them on the weekend  Childhood History:  By whom was/is the patient raised?: Both parents Description of patient's relationship with caregiver when they were a child: good, nice Father was an alcoholic Patient's description of current relationship with people who raised him/her: mother deceased, good with father Does patient have siblings?: Yes Number of Siblings: 5  (1 sister died, 1 brother died (see losses)) Description of patient's current relationship with siblings: good with  remaining siblings Did patient suffer any verbal/emotional/physical/sexual abuse as a child?: No Did patient suffer from severe childhood neglect?: No Has patient ever been sexually abused/assaulted/raped as an adolescent or adult?: No Was the patient ever a victim of a crime or a disaster?: No Witnessed domestic violence?: No Has patient been effected by domestic violence as an adult?: No  Education:  Highest grade of school patient has completed: 8th grade Currently a student?: No Learning disability?: Yes What learning problems does patient have?: can't read or write  Employment/Work Situation:   Employment situation: On disability Why is patient on disability: mental illness How long has patient been on disability: just got benefits, retroactive 2 years Patient's job has been impacted by current illness: No What is the longest time patient has a held a job?: no public work Has patient ever been in the [employer]?: No Has patient ever served in Buyer, retail?: No  Financial Resources:   Surveyor, quantity resources: Support from parents / caregiver;Medicaid Does patient have a Lawyer or guardian?: No  Alcohol/Substance Abuse:   What has been your use of drugs/alcohol within the last 12 months?: drinks 1-2 beers 2x/wk If attempted suicide, did drugs/alcohol play a role in this?: No Alcohol/Substance Abuse Treatment Hx: Past Tx, Outpatient If yes, describe treatment: [facility] for 8-9 months-received certificate Has alcohol/substance abuse ever caused legal problems?: Yes (2DWI's-spent 6 months in prison, 1 1/2 years ago)  Social Support System:   Patient's Community Support System: Production assistant, radio System: sisters, father,  Type of faith/religion: none How does patient's faith help to cope with current illness?: N/A  Leisure/Recreation:   Leisure and Hobbies: watching TV, going to a friends house  Strengths/Needs:   What things does the  patient do well?: love  to be happy and laugh In what areas does patient struggle / problems for patient: can't provide for my kids like I would like to  Discharge Plan:   Does patient have access to transportation?: Yes (sister) Will patient be returning to same living situation after discharge?: Yes (sister's house) Currently receiving community mental health services: Yes (From Whom) (appointment set with [facility]) If no, would patient like referral for services when discharged?: No Does patient have financial barriers related to discharge medications?: Yes Patient description of barriers related to discharge medications: lack of income, but recently got Medicaid  Summary/Recommendations:   Summary and Recommendations (to be completed by the evaluator): Patient is a 36 year old African American male with diagnosis of Bipolar, Depression. Patient was admitted with increased depression and auditory hallucinations. This was preceiptated by death of his brother 2 months ago. He has had multiple family losses in the last 2 years. Patient would benefit from crisis stabilization, medication evalaution, group therapy and psycho-education groups to work on coping skills and grief issues, case Production designer, theatre/television/film for referrals and  counselor to contact family for support.  [name], [name]. 02/28/2012

## 2012-02-28 NOTE — Progress Notes (Signed)
Pt resting in bed with eyes closed.  No distress observed.  Safety maintained with q15 minute checks. 

## 2012-02-28 NOTE — Progress Notes (Signed)
[facility] Group Notes:  (Counselor/Nursing/MHT/Case Management/Adjunct)  02/28/2012 2:10 PM  Type of Therapy:  Group Therapy  Participation Level:  Minimal  Participation Quality:  Attentive  Affect:  Blunted  Cognitive:  Oriented  Insight:  Limited  Engagement in Group:  Limited  Engagement in Therapy:  Limited  Modes of Intervention:  Education and Support  Summary of Progress/Problems: Patient was quiet but attentive.  Corda Shutt, [name] 02/28/2012, 2:10 PM

## 2012-02-28 NOTE — Progress Notes (Signed)
Patient does not report si or hi and no hallucinations.  Patient reports difficulty reading, assisted with self-inventory.  He rates depression as a 6 on 10 scale.  Goal is to stay on medications after discharge and sister will help with that.

## 2012-02-28 NOTE — Progress Notes (Signed)
Pt attended discharge planning group and actively participated.  SW met with pt individually at this time.  Pt presents with flat affect and depressed mood.  Pt reports feeling ready to d/c.  Treatment team discussed pt's progress and determined pt would benefit being here an additional day.  Pt's affect has changed from yesterday and it was reported pt told his sister yesterday he continues to hear voices.  No further needs voiced by pt at this time.   Reyes Ivan, LCSWA 02/28/2012  11:30 AM

## 2012-02-28 NOTE — Progress Notes (Signed)
02/28/2012  Time: 0930   Group Topic/Focus: The focus of this group is on discussing various aspects of wellness, balancing those aspects and exploring ways to increase the ability to experience wellness.   Participation Level:  Minimal  Participation Quality:  Resistant  Affect:  Blunted  Cognitive:  Alert   Additional Comments: None.   [name]  02/28/2012 1:07 PM

## 2012-02-29 MED ORDER — TRAZODONE HCL 100 MG PO TABS: 100.0000 mg | ORAL_TABLET | Freq: Every day | ORAL | Status: DC

## 2012-02-29 MED ORDER — PHENYTOIN SODIUM EXTENDED 100 MG PO CAPS: 300.0000 mg | ORAL_CAPSULE | Freq: Every day | ORAL | Status: DC

## 2012-02-29 MED ORDER — RISPERIDONE 2 MG PO TABS
2.0000 mg | ORAL_TABLET | ORAL | Status: DC
  Filled 2012-02-29: qty 14

## 2012-02-29 MED ORDER — SERTRALINE HCL 50 MG PO TABS
50.0000 mg | ORAL_TABLET | Freq: Every day | ORAL | Status: DC
  Filled 2012-02-29: qty 7

## 2012-02-29 MED ORDER — HALOPERIDOL 5 MG PO TABS: 5.0000 mg | ORAL_TABLET | Freq: Three times a day (TID) | ORAL | Status: DC

## 2012-02-29 MED ORDER — BENZTROPINE MESYLATE 1 MG PO TABS: 1.0000 mg | ORAL_TABLET | ORAL | Status: DC

## 2012-02-29 MED ORDER — BENZTROPINE MESYLATE 1 MG PO TABS
1.0000 mg | ORAL_TABLET | ORAL | Status: DC
  Filled 2012-02-29: qty 14

## 2012-02-29 MED ORDER — TRAZODONE HCL 100 MG PO TABS
100.0000 mg | ORAL_TABLET | Freq: Every day | ORAL | Status: DC
  Filled 2012-02-29: qty 7

## 2012-02-29 MED ORDER — SERTRALINE HCL 50 MG PO TABS: 50.0000 mg | ORAL_TABLET | Freq: Every day | ORAL | Status: DC

## 2012-02-29 MED ORDER — RISPERIDONE 2 MG PO TABS: 2.0000 mg | ORAL_TABLET | ORAL | Status: DC

## 2012-02-29 MED ORDER — PHENYTOIN SODIUM EXTENDED 100 MG PO CAPS
300.0000 mg | ORAL_CAPSULE | Freq: Every day | ORAL | Status: DC
  Filled 2012-02-29: qty 21

## 2012-02-29 NOTE — [facility] Suicide Risk Assessment (Signed)
Suicide Risk Assessment  Discharge Assessment     Demographic factors:  Assessment Details Time of Assessment: Discharge Information Obtained From: Patient Current Mental Status:  AO x 3. Risk Reduction Factors:  Risk Reduction Factors: Sense of responsibility to family;Living with another person, especially a relative  CLINICAL FACTORS:   Alcohol/Substance Abuse/Dependencies More than one psychiatric diagnosis Previous Psychiatric Diagnoses and Treatments Schizoaffective Disorder - Bipolar Type.  COGNITIVE FEATURES THAT CONTRIBUTE TO RISK:  None Noted.   Diagnosis:  Axis I: Schizoaffective Disorder - Bipolar Type.  Alcohol Abuse.   The patient was seen today and reports the following:   ADL's: Intact.  Sleep: The patient reports to sleeping well last night.  Appetite: The patient reports a good appetite.   Mild>(1-10) >Severe  Hopelessness (1-10): 0  Depression (1-10): 0  Anxiety (1-10): 0   Suicidal Ideation: The patient adamantly denies any suicidal ideations today.  Plan: No.  Intent: No.  Means: No.   Homicidal Ideation: The patient adamantly denies any homicidal ideations.  Plan: No  Intent: No.  Means: No   General Appearance /Behavior: Casual and cooperative today.  Eye Contact: Good.  Speech: Appropriate in rate and volume with no pressuring noted.  Motor Behavior: Appropriate.  Level of Consciousness: Alert and Oriented x 3.  Mental Status: Alert and Oriented x 3.  Mood: Euthymic today.  Affect: Bright and Full. Anxiety Level: No anxiety noted or reported.  Thought Process: wnl.  Thought Content: The patient denies any auditory or visual hallucinations today as well as any delusional thinking. Perception:. wnl  Judgment: Fair to Good.  Insight: Fair to Good.  Cognition: Oriented to time, place and person.   Lab Results: No results found for this or any previous visit (from the past 48 hour(s)).   Time was spent today discussing with the patient  his current symptoms. The patient reports to feeling good today with no depression, no auditory or visual hallucinations and no suicidal or homicidal ideations.  He plans to follow up with [facility] for mental health care.  He states that he plans to return home with his sister who is her health care power or attorney.  Treatment Plan Summary:  1. Daily contact with patient to assess and evaluate symptoms and progress in treatment  2. Medication management  3. The patient will deny suicidal ideations or homicidal ideations for 48 hours prior to discharge and have a depression and anxiety rating of 3 or less. The patient will also deny any auditory or visual hallucinations or delusional thinking.  4. The patient will deny any symptoms of substance withdrawal at time of discharge.   Plan:  1. Will continue the patient's current medications.  2. Will continue to monitor. 3. Will discharge the patient today to outpatient follow up.  SUICIDE RISK:   Minimal: No identifiable suicidal ideation.  Patients presenting with no risk factors but with morbid ruminations; may be classified as minimal risk based on the severity of the depressive symptoms  [name] 02/29/2012, 1:12 PM

## 2012-02-29 NOTE — Progress Notes (Signed)
[facility] Case Management Discharge Plan:  Will you be returning to the same living situation after discharge: Yes,  return home with sister At discharge, do you have transportation home?:Yes,  sister is picking pt up Do you have the ability to pay for your medications:Yes,  access to meds  Release of information consent forms completed and in the chart;  Patient's signature needed at discharge.  Patient to Follow up at:  Follow-up Information    Follow up with [facility]  on 03/07/2012. (Appointment scheduled at 1:30 pm with Dr. Dicky Doe)    Contact information:   [address], [place] [address] [phone]      [facility]   Contact information:   [phone] 1-4pm On [address] for an appointment for seizure medications if Medicaid has not assigned a physician to you on your Medicaid card.          Patient denies SI/HI:   Yes,  denies SI/HI    Aeronautical engineer and Suicide Prevention discussed:  Yes,  discussed with pt  Barrier to discharge identified:No.  Summary and Recommendations: Pt presents with calm mood and affect.  Pt has noticeably brighter affect today.  Pt reports feeling stable to d/c and is eager to get home.  No recommendations from SW.  No further needs voiced by pt.  Pt stable to discharge.     Carmina Miller 02/29/2012, 1:02 PM

## 2012-02-29 NOTE — Progress Notes (Signed)
Pt smiling when talking about upcoming discharge. He reports that voices have gone and has no si or hi. Pt's pulse was 112 this morning. Rechecked manually at 104. Reported to MD and NP. Pt has no physical complaints. Safety maintained on unit.

## 2012-02-29 NOTE — Tx Team (Signed)
Interdisciplinary Treatment Plan Update (Adult)  Date:  02/29/2012  Time Reviewed:  10:44 AM   Progress in Treatment: Attending groups: Yes Participating in groups:  Yes Taking medication as prescribed: Yes Tolerating medication:  Yes Family/Significant othe contact made:  Yes - contact made with pt's sister who is POA Patient understands diagnosis:  Yes Discussing patient identified problems/goals with staff:  Yes Medical problems stabilized or resolved:  Yes Denies suicidal/homicidal ideation: Yes Issues/concerns per patient self-inventory:  None identified Other: N/A  New problem(s) identified: None Identified  Reason for Continuation of Hospitalization: Stable to d/c  Interventions implemented related to continuation of hospitalization: Stable to d/c  Additional comments: N/A  Estimated length of stay: D/C today  Discharge Plan: Pt will follow up at [facility] for medication management and therapy.  Will live with sister.  New goal(s): N/A  Review of initial/current patient goals per problem list:    1. Goal(s): Reduce depressive symptoms  Met: Yes Target date: by discharge  As evidenced by: Reducing depression from a 10 to a 3 as reported by pt. Denies depression today.  2. Goal (s): Eliminate Suicidal Ideation  Met: Yes Target date: by discharge  As evidenced by: Eliminate suicidal ideation. Patient denies SI today.  3. Goal(s): Reduce Psychosis  Met: Yes Target date: by discharge  As evidenced by: Pt denies hearing voices today  4. Goal(s): Establish health care for seizure disorder.  Met: Yes Target date: by discharge  As evidenced by: NP will refer pt to [facility] or other appropriate agency for follow up.   Attendees: Patient:  [name] 02/29/2012 10:43 AM   Family:     Physician:  Franchot Gallo, MD 02/29/2012 10:43 AM   Nursing:    02/29/2012 10:43 AM   Case Manager:  Reyes Ivan, LCSWA 02/29/2012 10:43 AM   Counselor:  Veto Kemps, MT-BC  02/29/2012 10:43 AM   Other:  Ambrose Mantle, LCSW 02/29/2012 10:43 AM   Other:  Lynann Bologna, NP 02/29/2012 10:43 AM   Other:  Edwyna Shell, RN 02/29/2012 10:44 AM   Other:      Scribe for Treatment Team:   Carmina Miller, 02/29/2012, 10:43 AM

## 2012-02-29 NOTE — Progress Notes (Signed)
Pt d/c from hospital with sister. All items returned. D/C instructions given , prescriptions given and samples given. Pt denies si and hi.

## 2012-03-01 NOTE — Discharge Summary (Signed)
Physician Discharge Summary Note  Patient:  [name] is an 36 y.o., male MRN:  [id number] DOB:  05-04-1976 Patient phone:  [phone] (home)  Patient address:   [address] [place] [address],   Date of Admission:  02/25/2012 Date of Discharge: 02/29/2012  Discharge Diagnoses: Principal Problem:  *Schizoaffective disorder, bipolar type   Axis Diagnosis:   AXIS I:  Schizoaffective disorder, Bipolar type; Alcohol Abuse AXIS II:  No Diagnosis AXIS III:  Seizure disorder Past Medical History  Diagnosis Date  . Seizures   . Bipolar 1 disorder   . Hypertension    AXIS IV:  deferred AXIS V:  61-70 mild symptoms  Level of Care:  OP  Hospital Course:  [name] presented with an exacerbation of psychotic symptoms. Was also feeling very depressed shortly after the death of his brother a few weeks earlier. He also been alcohol and initially presented with an alcohol level of 417 mg percent. He was having both visual and auditory hallucinations.  He was admitted to her acute stabilization unit and started on Risperdal which he tolerated well. He had no symptoms of EPS. He was started on Zoloft to address his depression symptoms and trazodone for sleep. He stabilized uneventfully. He did not require detox from alcohol, and reported he did not a regular basis. Dilantin level was subtherapeutic and remain subtherapeutic even after was restarted. See levels below. And gradually his affect brightened, he reported feeling much better, with no suicidal thoughts and no hallucinations. He was pleasant cooperative on the unit in group participation was satisfactory.  Consults:  None  Significant Diagnostic Studies:  ETOH 417mg % in the ED.  Liver enzymes normal.  Dilantin 3.3 on current dose.   Discharge Vitals:   Blood pressure 118/80, pulse 104, temperature 97.9 F (36.6 C), temperature source Oral, resp. rate 17, height 5\' 5"  (1.651 m), weight 64.864 kg (143 lb), SpO2  100.00%.  Mental Status Exam: See Mental Status Examination and Suicide Risk Assessment completed by Attending Physician prior to discharge.  Discharge destination:  Home  Is patient on multiple antipsychotic therapies at discharge:  No   Has Patient had three or more failed trials of antipsychotic monotherapy by history:  No  Recommended Plan for Multiple Antipsychotic Therapies: n/a  Medication List  As of 03/01/2012  3:37 PM   STOP taking these medications         haloperidol 5 MG tablet         TAKE these medications      Indication    benztropine 1 MG tablet   Commonly known as: COGENTIN   Take 1 tablet (1 mg total) by mouth 2 (two) times daily in the am and at bedtime.. For EPS.       phenytoin 100 MG ER capsule   Commonly known as: DILANTIN   Take 3 capsules (300 mg total) by mouth daily. For seizures.       risperiDONE 2 MG tablet   Commonly known as: RISPERDAL   Take 1 tablet (2 mg total) by mouth 2 (two) times daily in the am and at bedtime.. For psychosis/voices.       sertraline 50 MG tablet   Commonly known as: ZOLOFT   Take 1 tablet (50 mg total) by mouth daily. For depression.       traZODone 100 MG tablet   Commonly known as: DESYREL   Take 1 tablet (100 mg total) by mouth at bedtime. For sleep  Follow-up Information    Follow up with [facility]  on 03/07/2012. (Appointment scheduled at 1:30 pm with Dr. Dicky Doe)    Contact information:   [address], [place] [address] [phone]      [facility]   Contact information:   [phone] 1-4pm [address] for an appointment for seizure medications if Medicaid has not assigned a physician to you on your Medicaid card.          Follow-up recommendations:  Activity:  unrestricted Diet:  regular  Signed: Treasa Bradshaw A 03/01/2012, 3:37 PM

## 2012-03-04 NOTE — Progress Notes (Signed)
Patient Discharge Instructions:  Psychiatric Admission Assessment Note Faxed,  03/04/2012 Discharge Summary Note Faxed,   03/04/2012 After Visit Summary (AVS) Faxed,  03/04/2012 Face Sheet Faxed, 03/04/2012 Faxed to the Next Level Care provider:  03/04/2012  Faxed to [facility] @ [phone]  Wandra Scot, 03/04/2012, 3:58 PM

## 2012-07-06 ENCOUNTER — Emergency Department (HOSPITAL_COMMUNITY): Payer: Medicaid Other

## 2012-07-06 ENCOUNTER — Encounter (HOSPITAL_COMMUNITY): Payer: Self-pay | Admitting: *Deleted

## 2012-07-06 ENCOUNTER — Emergency Department (HOSPITAL_COMMUNITY)
Admission: EM | Admit: 2012-07-06 | Discharge: 2012-07-07 | Disposition: A | Discharge status: Home / Self Care | Payer: Medicaid Other | Attending: Emergency Medicine | Admitting: Emergency Medicine

## 2012-07-06 DIAGNOSIS — I10 Essential (primary) hypertension: Secondary | ICD-10-CM | POA: Insufficient documentation

## 2012-07-06 DIAGNOSIS — F1010 Alcohol abuse, uncomplicated: Secondary | ICD-10-CM | POA: Insufficient documentation

## 2012-07-06 DIAGNOSIS — R569 Unspecified convulsions: Secondary | ICD-10-CM | POA: Insufficient documentation

## 2012-07-06 DIAGNOSIS — M25559 Pain in unspecified hip: Secondary | ICD-10-CM | POA: Insufficient documentation

## 2012-07-06 DIAGNOSIS — F10929 Alcohol use, unspecified with intoxication, unspecified: Secondary | ICD-10-CM

## 2012-07-06 DIAGNOSIS — F319 Bipolar disorder, unspecified: Secondary | ICD-10-CM | POA: Insufficient documentation

## 2012-07-06 DIAGNOSIS — F17200 Nicotine dependence, unspecified, uncomplicated: Secondary | ICD-10-CM | POA: Insufficient documentation

## 2012-07-06 LAB — CBC WITH DIFFERENTIAL/PLATELET
Basophils Absolute: 0 10*3/uL (ref 0.0–0.1)
Basophils Relative: 0 % (ref 0–1)
Eosinophils Absolute: 0 10*3/uL (ref 0.0–0.7)
Eosinophils Relative: 0 % (ref 0–5)
HCT: 46.3 % (ref 39.0–52.0)
Hemoglobin: 16.1 g/dL (ref 13.0–17.0)
Lymphocytes Relative: 9 % — ABNORMAL LOW (ref 12–46)
Lymphs Abs: 1.3 10*3/uL (ref 0.7–4.0)
MCH: 31.4 pg (ref 26.0–34.0)
MCHC: 34.8 g/dL (ref 30.0–36.0)
MCV: 90.3 fL (ref 78.0–100.0)
Monocytes Absolute: 1.1 10*3/uL — ABNORMAL HIGH (ref 0.1–1.0)
Monocytes Relative: 8 % (ref 3–12)
Neutro Abs: 11.6 10*3/uL — ABNORMAL HIGH (ref 1.7–7.7)
Neutrophils Relative %: 83 % — ABNORMAL HIGH (ref 43–77)
Platelets: 295 10*3/uL (ref 150–400)
RBC: 5.13 MIL/uL (ref 4.22–5.81)
RDW: 13.7 % (ref 11.5–15.5)
WBC: 14 10*3/uL — ABNORMAL HIGH (ref 4.0–10.5)

## 2012-07-06 LAB — BASIC METABOLIC PANEL
BUN: 10 mg/dL (ref 6–23)
CO2: 20 mEq/L (ref 19–32)
Calcium: 8.7 mg/dL (ref 8.4–10.5)
GFR calc non Af Amer: 83 mL/min — ABNORMAL LOW (ref >90)
Glucose, Bld: 126 mg/dL — ABNORMAL HIGH (ref 70–99)
Sodium: 142 mEq/L (ref 135–145)

## 2012-07-06 LAB — ETHANOL: Alcohol, Ethyl (B): 331 mg/dL — ABNORMAL HIGH (ref 0–11)

## 2012-07-06 LAB — PHENYTOIN LEVEL, TOTAL: Phenytoin Lvl: <2.5 ug/mL — ABNORMAL LOW (ref 10.0–20.0)

## 2012-07-06 MED ORDER — DIPHENHYDRAMINE HCL 50 MG/ML IJ SOLN
INTRAMUSCULAR | Status: AC
  Filled 2012-07-06: qty 1

## 2012-07-06 MED ORDER — PHENYTOIN SODIUM EXTENDED 100 MG PO CAPS: 300.0000 mg | ORAL_CAPSULE | Freq: Three times a day (TID) | ORAL | Status: DC

## 2012-07-06 MED ORDER — PHENYTOIN SODIUM EXTENDED 100 MG PO CAPS
300.0000 mg | ORAL_CAPSULE | Freq: Once | ORAL | Status: AC
  Administered 2012-07-06: 300 mg via ORAL
  Filled 2012-07-06: qty 3

## 2012-07-06 MED ORDER — PHENYTOIN SODIUM EXTENDED 100 MG PO CAPS
300.0000 mg | ORAL_CAPSULE | Freq: Once | ORAL | Status: AC
  Administered 2012-07-06: 300 mg via ORAL

## 2012-07-06 MED ORDER — DIPHENHYDRAMINE HCL 50 MG/ML IJ SOLN
25.0000 mg | Freq: Once | INTRAMUSCULAR | Status: AC
  Administered 2012-07-06: 25 mg via INTRAVENOUS

## 2012-07-06 MED ORDER — PHENYTOIN SODIUM EXTENDED 100 MG PO CAPS: 300.0000 mg | ORAL_CAPSULE | Freq: Once | ORAL | Status: DC

## 2012-07-06 MED ORDER — SODIUM CHLORIDE 0.9 % IV BOLUS (SEPSIS)
1000.0000 mL | Freq: Once | INTRAVENOUS | Status: AC
  Administered 2012-07-06 (×2): 1000 mL via INTRAVENOUS

## 2012-07-06 MED ORDER — PHENYTOIN SODIUM EXTENDED 100 MG PO CAPS
300.0000 mg | ORAL_CAPSULE | Freq: Once | ORAL | Status: DC
  Filled 2012-07-06: qty 3

## 2012-07-06 MED ORDER — SODIUM CHLORIDE 0.9 % IV SOLN: 1000.0000 mg | Freq: Once | INTRAVENOUS | Status: DC

## 2012-07-06 MED ORDER — PHENYTOIN SODIUM EXTENDED 100 MG PO CAPS
300.0000 mg | ORAL_CAPSULE | Freq: Once | ORAL | Status: AC
  Administered 2012-07-06: 300 mg via ORAL
  Filled 2012-07-06 (×2): qty 3

## 2012-07-06 NOTE — ED Notes (Signed)
Pt authorized this RN to call [phone] and advise family/friends of ED visit!  This RN spoke with Sarah palmer at the number.

## 2012-07-06 NOTE — ED Notes (Signed)
Will send message to pharmacy r/t missing dose dilantin

## 2012-07-06 NOTE — ED Notes (Signed)
This RN was advised by rad staff that pt refused c-spine yet they completed left hip xray.

## 2012-07-06 NOTE — ED Provider Notes (Signed)
History     CSN: [id number]  Arrival date & time 07/06/12  1723   First MD Initiated Contact with Patient 07/06/12 1729      No chief complaint on file.   (Consider location/radiation/quality/duration/timing/severity/associated sxs/prior treatment) HPI  36 year old male with history of bipolar disorder, seizure, alcohol abuse presents for evaluation of a possible seizure. Per EMS note, patient reportedly walking along the neighborhood and the neighbor saw the patient fell to the ground. Patient was helped up to a chair when he was noted to be diaphoretic and having trouble breathing. EMS arrived and was able to stabilize the patient. Patient reports that he did drink alcohol today. Sts he drinking because he has been losing loved ones.  Denies SI/HI.  sts he does have a hx seizure and was suppose to take dilantin but has ran out of meds for 2-3 months.  Hx was limited as pt is a poor historian.  Currently denies headache, cp, sob, abd pain.  Does endorse L hip pain and sts he did fell on it today.   Past Medical History  Diagnosis Date  . Seizures   . Bipolar 1 disorder   . Hypertension     Past Surgical History  Procedure Date  . No past surgeries     Family History  Problem Relation Age of Onset  . Cancer Brother     History  Substance Use Topics  . Smoking status: Current Some Day Smoker -- 0.2 packs/day for 5 years    Types: Cigarettes  . Smokeless tobacco: Not on file  . Alcohol Use: 8.4 oz/week    14 Cans of beer per week     drinks occassionally per pt      Review of Systems  Unable to perform ROS: Mental status change  Genitourinary:       Denies urinary incontinence    Allergies  Review of patient's allergies indicates no known allergies.  Home Medications   Current Outpatient Rx  Name Route Sig Dispense Refill  . BENZTROPINE MESYLATE 1 MG PO TABS Oral Take 1 tablet (1 mg total) by mouth 2 (two) times daily in the am and at bedtime.. For EPS. 60  tablet 0  . PHENYTOIN SODIUM EXTENDED 100 MG PO CAPS Oral Take 3 capsules (300 mg total) by mouth daily. For seizures. 90 capsule 1  . RISPERIDONE 2 MG PO TABS Oral Take 1 tablet (2 mg total) by mouth 2 (two) times daily in the am and at bedtime.. For psychosis/voices. 60 tablet 0  . SERTRALINE HCL 50 MG PO TABS Oral Take 1 tablet (50 mg total) by mouth daily. For depression. 30 tablet 0  . TRAZODONE HCL 100 MG PO TABS Oral Take 1 tablet (100 mg total) by mouth at bedtime. For sleep 30 tablet 0    There were no vitals taken for this visit.  Physical Exam  Nursing note and vitals reviewed. Constitutional: He appears well-developed and well-nourished. No distress.       Awake, alert, nontoxic appearance  HENT:  Head: Normocephalic and atraumatic.       Breath smell of EtOH  Tenderness noted to L side of face without midface tenderness, deformity, septal hematoma, hemotympanum, or overlying skin changes.   No tongue biting  Eyes: Conjunctivae are normal. Right eye exhibits no discharge. Left eye exhibits no discharge.  Neck: Normal range of motion. Neck supple.  Cardiovascular: Normal rate and regular rhythm.   Pulmonary/Chest: Effort normal. No respiratory distress. He exhibits  no tenderness.  Abdominal: Soft. There is no tenderness. There is no rebound.  Musculoskeletal: He exhibits no tenderness.       Left hip: He exhibits tenderness and bony tenderness. He exhibits normal range of motion, normal strength, no swelling, no crepitus and no deformity.       Left knee: Normal.       Cervical back: Normal.       Thoracic back: Normal.       Lumbar back: Normal.       ROM appears intact, no obvious focal weakness  Neurological: He is alert. GCS eye subscore is 4. GCS verbal subscore is 5. GCS motor subscore is 6.  Skin: Skin is warm and dry. No rash noted.  Psychiatric: He has a normal mood and affect.    ED Course  Procedures (including critical care time)  Labs Reviewed - No data  to display No results found.   No diagnosis found.  Results for orders placed during the hospital encounter of 07/06/12  ETHANOL      Component Value Range   Alcohol, Ethyl (B) 331 (*) 0 - 11 mg/dL  PHENYTOIN LEVEL, TOTAL      Component Value Range   Phenytoin Lvl <2.5 (*) 10.0 - 20.0 ug/mL  BASIC METABOLIC PANEL      Component Value Range   Sodium 142  135 - 145 mEq/L   Potassium 3.5  3.5 - 5.1 mEq/L   Chloride 103  96 - 112 mEq/L   CO2 20  19 - 32 mEq/L   Glucose, Bld 126 (*) 70 - 99 mg/dL   BUN 10  6 - 23 mg/dL   Creatinine, Ser 1.61  0.50 - 1.35 mg/dL   Calcium 8.7  8.4 - 09.6 mg/dL   GFR calc non Af Amer 83 (*) >90 mL/min   GFR calc Af Amer >90  >90 mL/min  CBC WITH DIFFERENTIAL      Component Value Range   WBC 14.0 (*) 4.0 - 10.5 K/uL   RBC 5.13  4.22 - 5.81 MIL/uL   Hemoglobin 16.1  13.0 - 17.0 g/dL   HCT 04.5  40.9 - 81.1 %   MCV 90.3  78.0 - 100.0 fL   MCH 31.4  26.0 - 34.0 pg   MCHC 34.8  30.0 - 36.0 g/dL   RDW 91.4  78.2 - 95.6 %   Platelets 295  150 - 400 K/uL   Neutrophils Relative 83 (*) 43 - 77 %   Neutro Abs 11.6 (*) 1.7 - 7.7 K/uL   Lymphocytes Relative 9 (*) 12 - 46 %   Lymphs Abs 1.3  0.7 - 4.0 K/uL   Monocytes Relative 8  3 - 12 %   Monocytes Absolute 1.1 (*) 0.1 - 1.0 K/uL   Eosinophils Relative 0  0 - 5 %   Eosinophils Absolute 0.0  0.0 - 0.7 K/uL   Basophils Relative 0  0 - 1 %   Basophils Absolute 0.0  0.0 - 0.1 K/uL   Dg Hip Complete Left  07/06/2012  *RADIOLOGY REPORT*  Clinical Data: Fall.  Left hip injury and pain.  LEFT HIP - COMPLETE 2+ VIEW  Comparison: 10/24/2011  Findings: No evidence of acute fracture or dislocation.  Chronic avascular necrosis is seen involving the left femoral head. There has been progressive flattening of the articular surface of the femoral head.  Mild degenerative spurring of the hip joint is also noted, without significant joint space narrowing.  IMPRESSION:  1.  No acute findings. 2.  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 3.  Mild secondary osteoarthritis.  Original Report Authenticated By: Danae Orleans, M.D.    1. Seizure 2. Alcohol intoxication  MDM  Alcohol abuse, hx of seizure.  Not taking dilantin x 2-3 months. Possible seizure episode today.  Unable to clear c-spine as pt appears intoxicated.  C-collar in place.    9:17 PM Pt's dilantin level is subtherapeutic.  Dilantin loading dose given.  Pt has EtOH of 331.  Mildly elevated WBC of 14.  L hip xray unremarkable.    10:03 PM Pt endorse facial pain, examination unremarkable.  Family member at bedside.  I have updated progress to family member.  Head CT and cspine xray ordered to evaluation for the fall.    11:42 PM Pt has been observed for the past 6 hrs.  He is clinically sober.  Head CT unremarkable.  Pt will be discharge to home with family.  Will prescribe dilantin and have pt f/u with PCP for further care.  Resources given.    Fayrene Helper, PA-C 07/06/12 2343

## 2012-07-06 NOTE — ED Notes (Addendum)
ems received a call for possible cpr,  Patient found sitting in a chair but noted to be diaphoretic and with agonal respirations.  Patient reported to be walking and neighbors witnessed the patient fall.  Patient assisted to the chair by neighbors.   ems reports patient was altered,  He could localize pain.  Patient with no obvious trauma.  Patient with assisted ventilation on scene.  Patient became more alert enroute.  Patient cbg reported to be 110.  Airway intact upon arrival.  ekg sinus tach 120. Patient admits to etoh today.  Patient with some facial swelling noted upon arrival.  He is fully immobilized due to reported fall.   He has 18 g to left posterior forearm.

## 2012-07-06 NOTE — ED Notes (Signed)
Patient transported to X-ray.  Pt argumentative with staff.

## 2012-07-07 LAB — RAPID URINE DRUG SCREEN, HOSP PERFORMED
Amphetamines: NOT DETECTED
Benzodiazepines: NOT DETECTED
Opiates: NOT DETECTED

## 2012-07-11 NOTE — ED Provider Notes (Signed)
Medical screening examination/treatment/procedure(s) were performed by non-physician practitioner and as supervising physician I was immediately available for consultation/collaboration.  Raeford Razor, MD 07/11/12 [phone]

## 2013-01-25 ENCOUNTER — Ambulatory Visit: Payer: Self-pay

## 2013-01-25 ENCOUNTER — Ambulatory Visit (INDEPENDENT_AMBULATORY_CARE_PROVIDER_SITE_OTHER): Payer: Self-pay | Admitting: Family Medicine

## 2013-01-25 VITALS — BP 95/69 | HR 93 | Temp 98.1°F | Resp 18 | Ht 66.25 in | Wt 151.0 lb

## 2013-01-25 DIAGNOSIS — R569 Unspecified convulsions: Secondary | ICD-10-CM

## 2013-01-25 DIAGNOSIS — F209 Schizophrenia, unspecified: Secondary | ICD-10-CM

## 2013-01-25 DIAGNOSIS — Z01818 Encounter for other preprocedural examination: Secondary | ICD-10-CM

## 2013-01-25 DIAGNOSIS — F1010 Alcohol abuse, uncomplicated: Secondary | ICD-10-CM
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
## 2013-01-27 ENCOUNTER — Encounter: Payer: Self-pay | Admitting: Family Medicine

## 2013-01-27 DIAGNOSIS — F1010 Alcohol abuse, uncomplicated: Secondary | ICD-10-CM | POA: Insufficient documentation

## 2013-01-27 DIAGNOSIS — R569 Unspecified convulsions: Secondary | ICD-10-CM | POA: Insufficient documentation

## 2013-01-27 DIAGNOSIS — F209 Schizophrenia, unspecified: Secondary | ICD-10-CM | POA: Insufficient documentation

## 2013-01-28 ENCOUNTER — Encounter (HOSPITAL_COMMUNITY): Payer: Self-pay | Admitting: Pharmacy Technician

## 2013-01-28 ENCOUNTER — Encounter (HOSPITAL_COMMUNITY)
Admission: RE | Admit: 2013-01-28 | Discharge: 2013-01-28 | Disposition: A | Discharge status: Home / Self Care | Payer: Medicaid Other | Source: Ambulatory Visit | Attending: Orthopedic Surgery | Admitting: Orthopedic Surgery

## 2013-01-28 ENCOUNTER — Encounter (HOSPITAL_COMMUNITY): Payer: Self-pay

## 2013-01-28 DIAGNOSIS — Z01812 Encounter for preprocedural laboratory examination: Secondary | ICD-10-CM | POA: Insufficient documentation

## 2013-01-28 DIAGNOSIS — Z5309 Procedure and treatment not carried out because of other contraindication: Secondary | ICD-10-CM | POA: Insufficient documentation

## 2013-01-28 DIAGNOSIS — M87059 Idiopathic aseptic necrosis of unspecified femur: Secondary | ICD-10-CM | POA: Insufficient documentation

## 2013-01-28 HISTORY — DX: Idiopathic aseptic necrosis of unspecified femur: M87.059

## 2013-01-28 LAB — BASIC METABOLIC PANEL
BUN: 13 mg/dL (ref 6–23)
Chloride: 98 mEq/L (ref 96–112)
GFR calc non Af Amer: >90 mL/min (ref >90)
Glucose, Bld: 74 mg/dL (ref 70–99)
Potassium: 4.5 mEq/L (ref 3.5–5.1)
Sodium: 137 mEq/L (ref 135–145)

## 2013-01-28 LAB — PROTIME-INR: Prothrombin Time: 12.3 seconds (ref 11.6–15.2)

## 2013-01-28 LAB — URINALYSIS, ROUTINE W REFLEX MICROSCOPIC
Glucose, UA: NEGATIVE mg/dL
Hgb urine dipstick: NEGATIVE
Leukocytes, UA: NEGATIVE
pH: 6 (ref 5.0–8.0)

## 2013-01-28 LAB — CBC
HCT: 41.1 % (ref 39.0–52.0)
Hemoglobin: 14.1 g/dL (ref 13.0–17.0)
MCH: 30.6 pg (ref 26.0–34.0)
MCHC: 34.3 g/dL (ref 30.0–36.0)
RBC: 4.61 MIL/uL (ref 4.22–5.81)

## 2013-01-28 LAB — SURGICAL PCR SCREEN: MRSA, PCR: NEGATIVE

## 2013-01-28 NOTE — Patient Instructions (Signed)
YOUR SURGERY IS SCHEDULED AT [facility]  ON: Tuesday  2/11  REPORT TO [facility]ORT [facility]AY [facility] AT:  12:35 PM      PHONE # FOR SHORT STAY IS [phone]  DO NOT EAT ANYTHING AFTER MIDNIGHT THE NIGHT BEFORE YOUR SURGERY.   NO FOOD, NO CHEWING GUM, NO MINTS, NO CANDIES, NO CHEWING TOBACCO.  YOU MAY HAVE CLEAR LIQUIDS TO DRINK FROM MIDNIGHT THE NIGHT BEFORE YOUR SURGERY UNTIL 9:30 AM--LIKE WATER, GINGERALE, APPLE JUICE.       NOTHING TO DRINK AFTER 9:30 AM OF SURGERY.   PLEASE TAKE THE FOLLOWING MEDICATIONS THE AM OF YOUR SURGERY WITH A FEW SIPS OF WATER:  BENZTROPINE, RISPERIDONE, SERTRALINE, AND DILANTIN      DO NOT BRING VALUABLES, MONEY, CREDIT CARDS.  DO NOT WEAR JEWELRY, MAKE-UP, NAIL POLISH AND NO METAL PINS OR CLIPS IN YOUR HAIR. CONTACT LENS, DENTURES / PARTIALS, GLASSES SHOULD NOT BE WORN TO SURGERY AND IN MOST CASES-HEARING AIDS WILL NEED TO BE REMOVED.  BRING YOUR GLASSES CASE, ANY EQUIPMENT NEEDED FOR YOUR CONTACT LENS. FOR PATIENTS ADMITTED TO THE HOSPITAL--CHECK OUT TIME THE DAY OF DISCHARGE IS 11:00 AM.  ALL INPATIENT ROOMS ARE PRIVATE - WITH BATHROOM, TELEPHONE, TELEVISION AND WIFI INTERNET.                                PLEASE READ OVER ANY  FACT SHEETS THAT YOU WERE GIVEN: MRSA INFORMATION, BLOOD TRANSFUSION INFORMATION, INCENTIVE SPIROMETER INFORMATION. FAILURE TO FOLLOW THESE INSTRUCTIONS MAY RESULT IN THE CANCELLATION OF YOUR SURGERY.   PATIENT SIGNATURE_________________________________

## 2013-01-28 NOTE — Pre-Procedure Instructions (Signed)
PREOP CBC, BMET, PT, PTT, UA WERE DONE TODAY AT [facility] AS PER ORDERS DR. Charlann Boxer.  CXR NOT NEEDED AND EKG REPORT IS IN EPIC FROM 03/10/12.  PT' SISTER AND POA GWENDOLYN WILSON WITH PT AT PREOP APPOINTMENT AND SHE BROUGHT A COPY OF HER POA.  SHE STATES PT IS ABLE TO SIGN HIS LEGAL PAPERS--HE VOICED UNDERSTANDING OF PLANNED SURGERY AND CONSENTS FOR SURGERY AND BLOOD TRANSFUSIONS READ TO PT BY HIS SISTER AND PT SIGNED CONSENTS. PT HAS APPT TO SEE DR. Bruna Potter TOMORROW - WED 01/29/13 AND TO OBTAIN PRESCRIPTION FOR DILANTIN AND RESUME TAKING--STATES HE HAS BEEN OUT OF MEDICATION FOR A WHILE.

## 2013-02-04 ENCOUNTER — Ambulatory Visit (HOSPITAL_COMMUNITY): Admission: RE | Admit: 2013-02-04 | Payer: Medicaid Other | Source: Ambulatory Visit | Admitting: Orthopedic Surgery

## 2013-02-04 SURGERY — ARTHROPLASTY, HIP, TOTAL, ANTERIOR APPROACH
Anesthesia: Choice | Site: Hip | Laterality: Left

## 2013-02-10 ENCOUNTER — Encounter (HOSPITAL_COMMUNITY): Payer: Self-pay | Admitting: Emergency Medicine

## 2013-02-10 ENCOUNTER — Emergency Department (HOSPITAL_COMMUNITY)
Admission: EM | Admit: 2013-02-10 | Discharge: 2013-02-10 | Disposition: A | Discharge status: Home / Self Care | Payer: Medicaid Other | Attending: Emergency Medicine | Admitting: Emergency Medicine

## 2013-02-10 DIAGNOSIS — Z79899 Other long term (current) drug therapy: Secondary | ICD-10-CM | POA: Insufficient documentation

## 2013-02-10 DIAGNOSIS — M87059 Idiopathic aseptic necrosis of unspecified femur: Secondary | ICD-10-CM | POA: Insufficient documentation

## 2013-02-10 DIAGNOSIS — F1910 Other psychoactive substance abuse, uncomplicated: Secondary | ICD-10-CM | POA: Insufficient documentation

## 2013-02-10 DIAGNOSIS — Z5181 Encounter for therapeutic drug level monitoring: Secondary | ICD-10-CM

## 2013-02-10 DIAGNOSIS — F17200 Nicotine dependence, unspecified, uncomplicated: Secondary | ICD-10-CM | POA: Insufficient documentation

## 2013-02-10 DIAGNOSIS — F209 Schizophrenia, unspecified: Secondary | ICD-10-CM | POA: Insufficient documentation

## 2013-02-10 DIAGNOSIS — R569 Unspecified convulsions: Secondary | ICD-10-CM | POA: Insufficient documentation

## 2013-02-10 LAB — PHENYTOIN LEVEL, TOTAL: Phenytoin Lvl: 8.1 ug/mL — ABNORMAL LOW (ref 10.0–20.0)

## 2013-02-10 NOTE — ED Notes (Signed)
Pt here with caregiver states he needs to have his Dilantin level checked, states he had been on Dilantin in the past and just started back about a week ago and wants to have his level checked.  Pt states he also had some mild facial swelling last night but it has resolved and denies any c/o at this time.

## 2013-02-10 NOTE — ED Provider Notes (Signed)
History     CSN: [id number]  Arrival date & time 02/10/13  1105   First MD Initiated Contact with Patient 02/10/13 1119      Chief Complaint  Patient presents with  . Labs Only    (Consider location/radiation/quality/duration/timing/severity/associated sxs/prior treatment) The history is provided by the patient.  pt w hx seizures, presents w family, requesting dilantin level. Was placed on dilantin 1-2 weeks ago, states compliant w rx, was told needed level checked as part of preop evaluation/clearance for planned left hip surgery. No recent seizures. States no longer drinking etoh. No tremor or shakes. No nv. States otherwise health at baseline, denies any other c/o or current issues or symptoms.      Past Medical History  Diagnosis Date  . Substance abuse   . Seizures     grand mal --usually takes dilantin but out of for last couple of months--plans to see doctor wed 01/29/13 to start back on dilantin  . Avascular necrosis of hip     left  . Schizophrenia     Past Surgical History  Procedure Laterality Date  . No past surgeries      Family History  Problem Relation Age of Onset  . Cancer Brother   . Diabetes Mother   . Hypertension Mother   . Cancer Father   . Hypertension Father   . Hypertension Sister   . Diabetes Maternal Grandmother     History  Substance Use Topics  . Smoking status: Current Every Day Smoker -- 5.00 packs/day for 5 years    Types: Cigarettes  . Smokeless tobacco: Never Used  . Alcohol Use: 0.0 oz/week     Comment: PT STATES 4 cans beer daily -HIS SISTER SAYS HE HAS ALCOHOLISM-SHE IS CONCERNED ABOUT WITHDRAL AFTER SURGERY      Review of Systems  Constitutional: Negative for fever.  Respiratory: Negative for shortness of breath.   Gastrointestinal: Negative for vomiting.  Skin: Negative for rash.  Neurological: Negative for seizures.    Allergies  Review of patient's allergies indicates no known allergies.  Home Medications    Current Outpatient Rx  Name  Route  Sig  Dispense  Refill  . benztropine (COGENTIN) 1 MG tablet   Oral   Take 1 mg by mouth 2 (two) times daily.         . risperiDONE (RISPERDAL) 3 MG tablet   Oral   Take 3 mg by mouth 2 (two) times daily.         . sertraline (ZOLOFT) 50 MG tablet   Oral   Take 50 mg by mouth daily.         . traZODone (DESYREL) 100 MG tablet   Oral   Take 200 mg by mouth at bedtime.            BP 125/84  Pulse 107  Temp(Src) 98.2 F (36.8 C) (Oral)  SpO2 100%  Physical Exam  Nursing note and vitals reviewed. Constitutional: He appears well-developed and well-nourished. No distress.  HENT:  Head: Atraumatic.  Eyes: Conjunctivae are normal. Pupils are equal, round, and reactive to light. No scleral icterus.  Neck: Neck supple. No tracheal deviation present.  Cardiovascular: Normal rate.   Pulmonary/Chest: Effort normal. No accessory muscle usage. No respiratory distress.  Abdominal: Soft. He exhibits no distension. There is no tenderness.  Musculoskeletal: Normal range of motion. He exhibits no edema and no tenderness.  Neurological: He is alert.  Alert, content. Motor intact bil. Steady gait.  Skin: Skin is warm and dry.  Psychiatric: He has a normal mood and affect.    ED Course  Procedures (including critical care time)  Results for orders placed during the hospital encounter of 02/10/13  PHENYTOIN LEVEL, TOTAL      Result Value Range   Phenytoin Lvl 8.1 (*) 10.0 - 20.0 ug/mL        MDM  Labs.  Reviewed nursing notes and prior charts for additional history.   Dilantin 8.1 - to clarify dose, family states takes 100 mg 3x/day.  Given no recent seizures and just starting dilantin, will leave dose unchanged and refer to his doctor for continued monitoring of level and possible subsequent dose adjustments.   Hr 88, rr 16, asymptomatic. Appears stable for d/c.         Suzi Roots, MD 02/10/13 1336

## 2013-02-19 ENCOUNTER — Other Ambulatory Visit (HOSPITAL_COMMUNITY): Payer: Self-pay | Admitting: Internal Medicine

## 2013-02-19 ENCOUNTER — Ambulatory Visit (HOSPITAL_COMMUNITY)
Admission: RE | Admit: 2013-02-19 | Discharge: 2013-02-19 | Disposition: A | Discharge status: Home / Self Care | Payer: Medicaid Other | Source: Ambulatory Visit | Attending: Internal Medicine | Admitting: Internal Medicine

## 2013-02-19 DIAGNOSIS — R079 Chest pain, unspecified: Secondary | ICD-10-CM | POA: Insufficient documentation

## 2013-02-19 DIAGNOSIS — M25559 Pain in unspecified hip: Secondary | ICD-10-CM | POA: Insufficient documentation

## 2013-04-04 ENCOUNTER — Emergency Department (HOSPITAL_COMMUNITY)
Admission: EM | Admit: 2013-04-04 | Discharge: 2013-04-04 | Disposition: A | Discharge status: Home / Self Care | Payer: Medicaid Other | Attending: Emergency Medicine | Admitting: Emergency Medicine

## 2013-04-04 ENCOUNTER — Encounter (HOSPITAL_COMMUNITY): Payer: Self-pay | Admitting: Emergency Medicine

## 2013-04-04 DIAGNOSIS — G40909 Epilepsy, unspecified, not intractable, without status epilepticus: Secondary | ICD-10-CM | POA: Insufficient documentation

## 2013-04-04 DIAGNOSIS — F17200 Nicotine dependence, unspecified, uncomplicated: Secondary | ICD-10-CM | POA: Insufficient documentation

## 2013-04-04 DIAGNOSIS — Z79899 Other long term (current) drug therapy: Secondary | ICD-10-CM | POA: Insufficient documentation

## 2013-04-04 DIAGNOSIS — M25552 Pain in left hip: Secondary | ICD-10-CM

## 2013-04-04 DIAGNOSIS — Z8739 Personal history of other diseases of the musculoskeletal system and connective tissue: Secondary | ICD-10-CM | POA: Insufficient documentation

## 2013-04-04 DIAGNOSIS — M25559 Pain in unspecified hip: Secondary | ICD-10-CM | POA: Insufficient documentation

## 2013-04-04 DIAGNOSIS — F209 Schizophrenia, unspecified: Secondary | ICD-10-CM | POA: Insufficient documentation

## 2013-04-04 MED ORDER — OXYCODONE-ACETAMINOPHEN 5-325 MG PO TABS: 2.0000 | ORAL_TABLET | Freq: Four times a day (QID) | ORAL | Status: DC | PRN

## 2013-04-04 MED ORDER — HYDROMORPHONE HCL PF 2 MG/ML IJ SOLN
2.0000 mg | Freq: Once | INTRAMUSCULAR | Status: AC
  Administered 2013-04-04: 2 mg via INTRAMUSCULAR
  Filled 2013-04-04: qty 1

## 2013-04-04 NOTE — ED Provider Notes (Signed)
History     CSN: [id number]  Arrival date & time 04/04/13  1553   First MD Initiated Contact with Patient 04/04/13 1635      Chief Complaint  Patient presents with  . Hip Pain    (Consider location/radiation/quality/duration/timing/severity/associated sxs/prior treatment) Patient is a 37 y.o. male presenting with hip pain. The history is provided by the patient (the pt has avascular necrosis of the left hip.  he needs pain medicine.  he is to get surgery).  Hip Pain This is a new problem. The current episode started more than 1 week ago. The problem occurs constantly. The problem has not changed since onset.Pertinent negatives include no chest pain, no abdominal pain and no headaches. The symptoms are aggravated by bending. Nothing relieves the symptoms.    Past Medical History  Diagnosis Date  . Substance abuse   . Seizures     grand mal --usually takes dilantin but out of for last couple of months--plans to see doctor wed 01/29/13 to start back on dilantin  . Avascular necrosis of hip     left  . Schizophrenia     Past Surgical History  Procedure Laterality Date  . No past surgeries      Family History  Problem Relation Age of Onset  . Cancer Brother   . Diabetes Mother   . Hypertension Mother   . Cancer Father   . Hypertension Father   . Hypertension Sister   . Diabetes Maternal Grandmother     History  Substance Use Topics  . Smoking status: Current Every Day Smoker -- 5.00 packs/day for 5 years    Types: Cigarettes  . Smokeless tobacco: Never Used  . Alcohol Use: 0.0 oz/week     Comment: PT STATES 4 cans beer daily -HIS SISTER SAYS HE HAS ALCOHOLISM-SHE IS CONCERNED ABOUT WITHDRAL AFTER SURGERY      Review of Systems  Constitutional: Negative for fatigue.  HENT: Negative for congestion, sinus pressure and ear discharge.   Eyes: Negative for discharge.  Respiratory: Negative for cough.   Cardiovascular: Negative for chest pain.  Gastrointestinal:  Negative for abdominal pain and diarrhea.  Genitourinary: Negative for frequency and hematuria.  Musculoskeletal: Negative for back pain.       Left hip pain  Skin: Negative for rash.  Neurological: Negative for seizures and headaches.  Psychiatric/Behavioral: Negative for hallucinations.    Allergies  Review of patient's allergies indicates no known allergies.  Home Medications   Current Outpatient Rx  Name  Route  Sig  Dispense  Refill  . benztropine (COGENTIN) 1 MG tablet   Oral   Take 1 mg by mouth 2 (two) times daily.         . phenytoin (DILANTIN) 100 MG ER capsule   Oral   Take 100-300 mg by mouth 3 (three) times daily. TAKES ON IN THE AM AND 2 IN THE PM         . risperiDONE (RISPERDAL) 3 MG tablet   Oral   Take 3 mg by mouth 2 (two) times daily.         . sertraline (ZOLOFT) 50 MG tablet   Oral   Take 50 mg by mouth daily.         . traZODone (DESYREL) 100 MG tablet   Oral   Take 200 mg by mouth at bedtime.          Marland Kitchen oxyCODONE-acetaminophen (PERCOCET) 5-325 MG per tablet   Oral   Take 2  tablets by mouth every 6 (six) hours as needed for pain.   30 tablet   0     BP 114/96  Pulse 118  Temp(Src) 98.4 F (36.9 C) (Oral)  Resp 20  SpO2 100%  Physical Exam  Constitutional: He is oriented to person, place, and time. He appears well-developed.  HENT:  Head: Normocephalic.  Eyes: Conjunctivae are normal.  Neck: No tracheal deviation present.  Cardiovascular:  No murmur heard. Musculoskeletal:  Tender left hip  Neurological: He is oriented to person, place, and time.  Skin: Skin is warm.  Psychiatric: He has a normal mood and affect.    ED Course  Procedures (including critical care time)  Labs Reviewed - No data to display No results found.   1. Hip pain, left       MDM          Benny Lennert, MD 04/04/13 1655
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
## 2013-04-14 ENCOUNTER — Encounter: Payer: Self-pay | Admitting: *Deleted

## 2013-04-18 NOTE — Progress Notes (Signed)
Dr Charlann Boxer-  NEED PRE OP ORDERS PLEASE-  Has appt PST 04/24/13  THANK YOU

## 2013-04-22 ENCOUNTER — Encounter (HOSPITAL_COMMUNITY): Payer: Self-pay | Admitting: Pharmacy Technician

## 2013-04-23 ENCOUNTER — Other Ambulatory Visit (HOSPITAL_COMMUNITY): Payer: Self-pay | Admitting: *Deleted

## 2013-04-24 ENCOUNTER — Encounter (HOSPITAL_COMMUNITY)
Admission: RE | Admit: 2013-04-24 | Discharge: 2013-04-24 | Disposition: A | Discharge status: Home / Self Care | Payer: Medicaid Other | Source: Ambulatory Visit | Attending: Orthopedic Surgery | Admitting: Orthopedic Surgery

## 2013-04-24 ENCOUNTER — Encounter (HOSPITAL_COMMUNITY): Payer: Self-pay

## 2013-04-24 HISTORY — DX: Idiopathic aseptic necrosis of unspecified bone: M87.00

## 2013-04-24 HISTORY — DX: Unspecified osteoarthritis, unspecified site: M19.90

## 2013-04-24 LAB — BASIC METABOLIC PANEL
CO2: 29 mEq/L (ref 19–32)
Chloride: 102 mEq/L (ref 96–112)
Glucose, Bld: 60 mg/dL — ABNORMAL LOW (ref 70–99)
Potassium: 3.9 mEq/L (ref 3.5–5.1)
Sodium: 139 mEq/L (ref 135–145)

## 2013-04-24 LAB — URINE MICROSCOPIC-ADD ON

## 2013-04-24 LAB — PROTIME-INR: Prothrombin Time: 13.5 seconds (ref 11.6–15.2)

## 2013-04-24 LAB — URINALYSIS, ROUTINE W REFLEX MICROSCOPIC
Bilirubin Urine: NEGATIVE
Nitrite: NEGATIVE
Specific Gravity, Urine: 1.028 (ref 1.005–1.030)
Urobilinogen, UA: 1 mg/dL (ref 0.0–1.0)

## 2013-04-24 LAB — CBC
Hemoglobin: 12.9 g/dL — ABNORMAL LOW (ref 13.0–17.0)
MCH: 30.7 pg (ref 26.0–34.0)
MCV: 91.4 fL (ref 78.0–100.0)
RBC: 4.2 MIL/uL — ABNORMAL LOW (ref 4.22–5.81)

## 2013-04-24 NOTE — Patient Instructions (Addendum)
Epic Tribbett  04/24/2013                           YOUR PROCEDURE IS SCHEDULED ON:  04/29/13               PLEASE REPORT TO SHORT STAY CENTER AT :  2:30 PM               CALL THIS NUMBER IF ANY PROBLEMS THE DAY OF SURGERY :               [phone]                      REMEMBER:   Do not eat food or drink liquids AFTER MIDNIGHT  May have clear liquids UNTIL 6 HOURS BEFORE SURGERY (11:30 AM)  Clear liquids include soda, tea, black coffee, apple or grape juice, broth.  Take these medicines the morning of surgery with A SIP OF WATER:  COGENTIN / DILANTIN / RISPERIDONE / ZOLOFT   Do not wear jewelry, make-up   Do not wear lotions, powders, or perfumes.   Do not shave legs or underarms 12 hrs. before surgery (men may shave face)  Do not bring valuables to the hospital.  Contacts, dentures or bridgework may not be worn into surgery.  Leave suitcase in the car. After surgery it may be brought to your room.  For patients admitted to the hospital more than one night, checkout time is 11:00                          The day of discharge.   Patients discharged the day of surgery will not be allowed to drive home                             If going home same day of surgery, must have someone stay with you first                           24 hrs at home and arrange for some one to drive you home from hospital.    Special Instructions:   Please read over the following fact sheets that you were given:               1. MRSA  INFORMATION                      2.  PREPARING FOR SURGERY SHEET               3. INCENTIVE SPIROMETER                                                X_____________________________________________________________________        Failure to follow these instructions may result in cancellation of your surgery

## 2013-04-24 NOTE — Progress Notes (Signed)
Abnormal UA and PCR faxed to Dr. Charlann Boxer thru [facility]

## 2013-04-24 NOTE — H&P (Signed)
TOTAL HIP ADMISSION H&P  Patient is admitted for left total hip arthroplasty, anterior approach.  Subjective:  Chief Complaint: Left hip OA / pain  HPI: [name], 37 y.o. male, has a history of pain and functional disability in the left hip(s) due to arthritis / AVN and patient has failed non-surgical conservative treatments for greater than 12 weeks to include NSAID's and/or analgesics and activity modification.  Onset of symptoms was gradual starting 2 years ago with rapidlly worsening course since that time.The patient noted no past surgery on the left hip(s).  Patient currently rates pain in the left hip at 9 out of 10 with activity. Patient has night pain, worsening of pain with activity and weight bearing, trendelenberg gait, pain that interfers with activities of daily living and pain with passive range of motion. Patient has evidence of periarticular osteophytes and joint space narrowing by imaging studies. This condition presents safety issues increasing the risk of falls.  There is no current active infection.  Risks, benefits and expectations were discussed with the patient. Patient understand the risks, benefits and expectations and wishes to proceed with surgery.   D/C Plans:   Home with HHPT  Post-op Meds:    No Rx given  Tranexamic Acid:   To be given  Decadron:    To be given  FYI:   Nothing to note   Patient Active Problem List   Diagnosis Date Noted  . Seizure 01/27/2013  . Alcohol abuse 01/27/2013  . Schizophrenia 01/27/2013  . Schizoaffective disorder, bipolar type 02/26/2012    Class: Acute   Past Medical History  Diagnosis Date  . Substance abuse     has not used for 5-6 yrs  . Avascular necrosis of hip     left  . Schizophrenia   . Seizures     HX Grand [place] seiaures - last seizure 6-7 months ago  . Avascular necrosis     L HIP  . Arthritis     hands    Past Surgical History  Procedure Laterality Date  . No past surgeries      No prescriptions  prior to admission   No Known Allergies   History  Substance Use Topics  . Smoking status: Current Every Day Smoker -- 5.00 packs/day for 5 years    Types: Cigarettes  . Smokeless tobacco: Never Used  . Alcohol Use: 0.0 oz/week     Comment: PT STATES 4 cans beer daily -HIS SISTER SAYS HE HAS ALCOHOLISM-SHE IS CONCERNED ABOUT WITHDRAL AFTER SURGERY    Family History  Problem Relation Age of Onset  . Cancer Brother   . Diabetes Mother   . Hypertension Mother   . Cancer Father   . Hypertension Father   . Hypertension Sister   . Diabetes Maternal Grandmother      Review of Systems  Constitutional: Negative.   HENT: Negative.   Eyes: Negative.   Respiratory: Negative.   Cardiovascular: Negative.   Gastrointestinal: Negative.   Genitourinary: Negative.   Musculoskeletal: Positive for joint pain.  Skin: Negative.   Neurological: Negative.   Endo/Heme/Allergies: Negative.   Psychiatric/Behavioral: Negative.     Objective:  Physical Exam  Constitutional: He is oriented to person, place, and time. He appears well-developed and well-nourished.  HENT:  Head: Normocephalic and atraumatic.  Mouth/Throat: Oropharynx is clear and moist.  Eyes: Pupils are equal, round, and reactive to light.  Neck: Neck supple. No JVD present. No tracheal deviation present. No thyromegaly present.  Cardiovascular:  Normal rate, regular rhythm, normal heart sounds and intact distal pulses.   Respiratory: Effort normal and breath sounds normal. No stridor. No respiratory distress. He has no wheezes.  GI: Soft. There is no tenderness. There is no guarding.  Musculoskeletal:       Left hip: He exhibits decreased range of motion, decreased strength, tenderness and bony tenderness. He exhibits no swelling, no deformity and no laceration.  Lymphadenopathy:    He has no cervical adenopathy.  Neurological: He is alert and oriented to person, place, and time.  Skin: Skin is warm and dry.  Psychiatric: He  has a normal mood and affect.    Vital signs in last 24 hours: Temp:  [97.6 F (36.4 C)] 97.6 F (36.4 C) (05/01 1220) Pulse Rate:  [94] 94 (05/01 1220) Resp:  [16] 16 (05/01 1220) BP: (118)/(70) 118/70 mmHg (05/01 1220) SpO2:  [100 %] 100 % (05/01 1220) Weight:  [70.818 kg (156 lb 2 oz)] 70.818 kg (156 lb 2 oz) (05/01 1220)  Labs:   Estimated body mass index is 24.22 kg/(m^2) as calculated from the following:   Height as of 01/28/13: 5\' 6"  (1.676 m).   Weight as of 01/28/13: 68.04 kg (150 lb).   Imaging Review Plain radiographs demonstrate severe degenerative joint disease of the left hip(s). The bone quality appears to be good for age and reported activity level.  Assessment/Plan:  End stage arthritis, left hip(s)  The patient history, physical examination, clinical judgement of the provider and imaging studies are consistent with end stage degenerative joint disease of the left hip(s) and total hip arthroplasty is deemed medically necessary. The treatment options including medical management, injection therapy, arthroscopy and arthroplasty were discussed at length. The risks and benefits of total hip arthroplasty were presented and reviewed. The risks due to aseptic loosening, infection, stiffness, dislocation/subluxation,  thromboembolic complications and other imponderables were discussed.  The patient acknowledged the explanation, agreed to proceed with the plan and consent was signed. Patient is being admitted for inpatient treatment for surgery, pain control, PT, OT, prophylactic antibiotics, VTE prophylaxis, progressive ambulation and ADL's and discharge planning.The patient is planning to be discharged home with home health services.    Anastasio Auerbach Karlina Suares   PAC  04/24/2013, 4:42 PM

## 2013-04-25 LAB — URINE CULTURE: Culture: NO GROWTH

## 2013-04-28 NOTE — Progress Notes (Addendum)
Called patient and spoke with his niece, Herbert Moors. She is informed that surgery time has been changed to 1645 on 04/29/13 and patient needs to arrive at 1415 on 04/29/13 to [facility] Short Stay. The niece states she understands and will inform her mother of the time changes.

## 2013-04-29 ENCOUNTER — Encounter (HOSPITAL_COMMUNITY): Payer: Self-pay | Admitting: *Deleted

## 2013-04-29 ENCOUNTER — Encounter (HOSPITAL_COMMUNITY)
Admission: RE | Disposition: A | Discharge status: Home Health | Payer: Self-pay | Source: Ambulatory Visit | Attending: Orthopedic Surgery

## 2013-04-29 ENCOUNTER — Ambulatory Visit (HOSPITAL_COMMUNITY): Payer: Medicaid Other

## 2013-04-29 ENCOUNTER — Ambulatory Visit (HOSPITAL_COMMUNITY): Payer: Medicaid Other | Admitting: Anesthesiology

## 2013-04-29 ENCOUNTER — Inpatient Hospital Stay (HOSPITAL_COMMUNITY)
Admission: RE | Admit: 2013-04-29 | Discharge: 2013-05-01 | DRG: 470 | Disposition: A | Discharge status: Home Health | Payer: Medicaid Other | Source: Ambulatory Visit | Attending: Orthopedic Surgery | Admitting: Orthopedic Surgery

## 2013-04-29 ENCOUNTER — Inpatient Hospital Stay (HOSPITAL_COMMUNITY): Payer: Medicaid Other

## 2013-04-29 ENCOUNTER — Encounter (HOSPITAL_COMMUNITY): Payer: Self-pay | Admitting: Anesthesiology

## 2013-04-29 DIAGNOSIS — E663 Overweight: Secondary | ICD-10-CM

## 2013-04-29 DIAGNOSIS — Z79899 Other long term (current) drug therapy: Secondary | ICD-10-CM

## 2013-04-29 DIAGNOSIS — M87059 Idiopathic aseptic necrosis of unspecified femur: Principal | ICD-10-CM | POA: Diagnosis present

## 2013-04-29 DIAGNOSIS — M169 Osteoarthritis of hip, unspecified: Secondary | ICD-10-CM | POA: Diagnosis present

## 2013-04-29 DIAGNOSIS — F319 Bipolar disorder, unspecified: Secondary | ICD-10-CM | POA: Diagnosis present

## 2013-04-29 DIAGNOSIS — Z6826 Body mass index (BMI) 26.0-26.9, adult: Secondary | ICD-10-CM

## 2013-04-29 DIAGNOSIS — M1610 Unilateral primary osteoarthritis, unspecified hip: Secondary | ICD-10-CM | POA: Diagnosis present

## 2013-04-29 DIAGNOSIS — G40309 Generalized idiopathic epilepsy and epileptic syndromes, not intractable, without status epilepticus: Secondary | ICD-10-CM | POA: Diagnosis present

## 2013-04-29 DIAGNOSIS — D500 Iron deficiency anemia secondary to blood loss (chronic): Secondary | ICD-10-CM

## 2013-04-29 DIAGNOSIS — F17200 Nicotine dependence, unspecified, uncomplicated: Secondary | ICD-10-CM | POA: Diagnosis present

## 2013-04-29 DIAGNOSIS — Z96649 Presence of unspecified artificial hip joint: Secondary | ICD-10-CM

## 2013-04-29 DIAGNOSIS — F259 Schizoaffective disorder, unspecified: Secondary | ICD-10-CM | POA: Diagnosis present

## 2013-04-29 DIAGNOSIS — D62 Acute posthemorrhagic anemia: Secondary | ICD-10-CM | POA: Diagnosis not present

## 2013-04-29 HISTORY — PX: TOTAL HIP ARTHROPLASTY: SHX124

## 2013-04-29 LAB — TYPE AND SCREEN: Antibody Screen: NEGATIVE

## 2013-04-29 SURGERY — ARTHROPLASTY, HIP, TOTAL, ANTERIOR APPROACH
Anesthesia: General | Site: Hip | Laterality: Left | Wound class: Clean

## 2013-04-29 MED ORDER — METHOCARBAMOL 100 MG/ML IJ SOLN
500.0000 mg | Freq: Four times a day (QID) | INTRAVENOUS | Status: DC | PRN
  Administered 2013-04-29: 500 mg via INTRAVENOUS
  Filled 2013-04-29 (×2): qty 5

## 2013-04-29 MED ORDER — PROMETHAZINE HCL 25 MG/ML IJ SOLN: 6.2500 mg | INTRAMUSCULAR | Status: DC | PRN

## 2013-04-29 MED ORDER — FLEET ENEMA 7-19 GM/118ML RE ENEM: 1.0000 | ENEMA | Freq: Once | RECTAL | Status: AC | PRN

## 2013-04-29 MED ORDER — ZOLPIDEM TARTRATE 5 MG PO TABS: 5.0000 mg | ORAL_TABLET | Freq: Every evening | ORAL | Status: DC | PRN

## 2013-04-29 MED ORDER — LACTATED RINGERS IV SOLN
INTRAVENOUS | Status: DC | PRN
  Administered 2013-04-29 (×2): via INTRAVENOUS

## 2013-04-29 MED ORDER — DIPHENHYDRAMINE HCL 25 MG PO CAPS
25.0000 mg | ORAL_CAPSULE | Freq: Four times a day (QID) | ORAL | Status: DC | PRN
  Administered 2013-04-30 (×2): 25 mg via ORAL
  Filled 2013-04-29 (×2): qty 1

## 2013-04-29 MED ORDER — TRAZODONE HCL 100 MG PO TABS
200.0000 mg | ORAL_TABLET | Freq: Every day | ORAL | Status: DC
Start: 2013-04-29 — End: 2013-05-01
  Administered 2013-04-29 – 2013-04-30 (×2): 200 mg via ORAL
  Filled 2013-04-29 (×3): qty 2

## 2013-04-29 MED ORDER — DEXAMETHASONE SODIUM PHOSPHATE 10 MG/ML IJ SOLN: 10.0000 mg | Freq: Once | INTRAMUSCULAR | Status: DC

## 2013-04-29 MED ORDER — THROMBIN 5000 UNITS EX SOLR
CUTANEOUS | Status: AC
  Filled 2013-04-29: qty 10000

## 2013-04-29 MED ORDER — HYDROMORPHONE HCL PF 1 MG/ML IJ SOLN
INTRAMUSCULAR | Status: DC | PRN
  Administered 2013-04-29 (×2): 1 mg via INTRAVENOUS

## 2013-04-29 MED ORDER — METOCLOPRAMIDE HCL 5 MG/ML IJ SOLN: 5.0000 mg | Freq: Three times a day (TID) | INTRAMUSCULAR | Status: DC | PRN

## 2013-04-29 MED ORDER — ONDANSETRON HCL 4 MG PO TABS: 4.0000 mg | ORAL_TABLET | Freq: Four times a day (QID) | ORAL | Status: DC | PRN

## 2013-04-29 MED ORDER — PHENYTOIN SODIUM EXTENDED 100 MG PO CAPS
100.0000 mg | ORAL_CAPSULE | Freq: Every day | ORAL | Status: DC
  Administered 2013-04-30 – 2013-05-01 (×2): 100 mg via ORAL
  Filled 2013-04-29 (×2): qty 1

## 2013-04-29 MED ORDER — ASPIRIN EC 325 MG PO TBEC
325.0000 mg | DELAYED_RELEASE_TABLET | Freq: Two times a day (BID) | ORAL | Status: DC
  Administered 2013-04-30 – 2013-05-01 (×3): 325 mg via ORAL
  Filled 2013-04-29 (×5): qty 1

## 2013-04-29 MED ORDER — CEFAZOLIN SODIUM-DEXTROSE 2-3 GM-% IV SOLR
INTRAVENOUS | Status: AC
  Filled 2013-04-29: qty 50

## 2013-04-29 MED ORDER — SERTRALINE HCL 100 MG PO TABS
100.0000 mg | ORAL_TABLET | Freq: Every morning | ORAL | Status: DC
  Administered 2013-04-30 – 2013-05-01 (×2): 100 mg via ORAL
  Filled 2013-04-29 (×2): qty 1

## 2013-04-29 MED ORDER — SUCCINYLCHOLINE CHLORIDE 20 MG/ML IJ SOLN
INTRAMUSCULAR | Status: DC | PRN
  Administered 2013-04-29: 100 mg via INTRAVENOUS

## 2013-04-29 MED ORDER — METOCLOPRAMIDE HCL 10 MG PO TABS: 5.0000 mg | ORAL_TABLET | Freq: Three times a day (TID) | ORAL | Status: DC | PRN

## 2013-04-29 MED ORDER — POLYETHYLENE GLYCOL 3350 17 G PO PACK
17.0000 g | PACK | Freq: Two times a day (BID) | ORAL | Status: DC
  Administered 2013-04-29 – 2013-05-01 (×4): 17 g via ORAL

## 2013-04-29 MED ORDER — BISACODYL 10 MG RE SUPP: 10.0000 mg | Freq: Every day | RECTAL | Status: DC | PRN

## 2013-04-29 MED ORDER — MIDAZOLAM HCL 5 MG/5ML IJ SOLN
INTRAMUSCULAR | Status: DC | PRN
  Administered 2013-04-29: 2 mg via INTRAVENOUS

## 2013-04-29 MED ORDER — CELECOXIB 200 MG PO CAPS
200.0000 mg | ORAL_CAPSULE | Freq: Two times a day (BID) | ORAL | Status: DC
  Administered 2013-04-29 – 2013-05-01 (×4): 200 mg via ORAL
  Filled 2013-04-29 (×5): qty 1

## 2013-04-29 MED ORDER — HYDROMORPHONE HCL PF 1 MG/ML IJ SOLN
INTRAMUSCULAR | Status: AC
  Filled 2013-04-29: qty 1

## 2013-04-29 MED ORDER — HYDROMORPHONE HCL PF 1 MG/ML IJ SOLN
0.5000 mg | INTRAMUSCULAR | Status: DC | PRN
  Administered 2013-04-29 – 2013-04-30 (×3): 1 mg via INTRAVENOUS
  Filled 2013-04-29 (×3): qty 1

## 2013-04-29 MED ORDER — DEXAMETHASONE SODIUM PHOSPHATE 10 MG/ML IJ SOLN
INTRAMUSCULAR | Status: DC | PRN
  Administered 2013-04-29: 10 mg via INTRAVENOUS

## 2013-04-29 MED ORDER — GLYCOPYRROLATE 0.2 MG/ML IJ SOLN
INTRAMUSCULAR | Status: DC | PRN
  Administered 2013-04-29: .5 mg via INTRAVENOUS

## 2013-04-29 MED ORDER — ONDANSETRON HCL 4 MG/2ML IJ SOLN: 4.0000 mg | Freq: Four times a day (QID) | INTRAMUSCULAR | Status: DC | PRN

## 2013-04-29 MED ORDER — TRANEXAMIC ACID 100 MG/ML IV SOLN
1000.0000 mg | Freq: Once | INTRAVENOUS | Status: AC
  Administered 2013-04-29: 1000 mg via INTRAVENOUS
  Filled 2013-04-29: qty 10

## 2013-04-29 MED ORDER — PHENOL 1.4 % MT LIQD: 1.0000 | OROMUCOSAL | Status: DC | PRN

## 2013-04-29 MED ORDER — PROPOFOL 10 MG/ML IV BOLUS
INTRAVENOUS | Status: DC | PRN
  Administered 2013-04-29: 150 mg via INTRAVENOUS

## 2013-04-29 MED ORDER — CEFAZOLIN SODIUM-DEXTROSE 2-3 GM-% IV SOLR
2.0000 g | INTRAVENOUS | Status: AC
  Administered 2013-04-29: 2 g via INTRAVENOUS

## 2013-04-29 MED ORDER — HYDROMORPHONE HCL PF 1 MG/ML IJ SOLN
0.2500 mg | INTRAMUSCULAR | Status: DC | PRN
  Administered 2013-04-29 (×4): 0.5 mg via INTRAVENOUS

## 2013-04-29 MED ORDER — HYDROCODONE-ACETAMINOPHEN 7.5-325 MG PO TABS
1.0000 | ORAL_TABLET | ORAL | Status: DC
  Administered 2013-04-29: 2 via ORAL
  Administered 2013-04-29: 1 via ORAL
  Administered 2013-04-30 – 2013-05-01 (×7): 2 via ORAL
  Administered 2013-05-01: 1 via ORAL
  Filled 2013-04-29 (×2): qty 2
  Filled 2013-04-29: qty 1
  Filled 2013-04-29 (×2): qty 2
  Filled 2013-04-29: qty 1
  Filled 2013-04-29 (×3): qty 2
  Filled 2013-04-29: qty 1

## 2013-04-29 MED ORDER — SODIUM CHLORIDE 0.9 % IV SOLN
100.0000 mL/h | INTRAVENOUS | Status: DC
  Administered 2013-04-29 – 2013-04-30 (×2): 100 mL/h via INTRAVENOUS
  Filled 2013-04-29 (×9): qty 1000

## 2013-04-29 MED ORDER — CHLORHEXIDINE GLUCONATE 4 % EX LIQD: 60.0000 mL | Freq: Once | CUTANEOUS | Status: DC

## 2013-04-29 MED ORDER — ALUM & MAG HYDROXIDE-SIMETH 200-200-20 MG/5ML PO SUSP: 30.0000 mL | ORAL | Status: DC | PRN

## 2013-04-29 MED ORDER — ROCURONIUM BROMIDE 100 MG/10ML IV SOLN
INTRAVENOUS | Status: DC | PRN
  Administered 2013-04-29: 30 mg via INTRAVENOUS
  Administered 2013-04-29 (×2): 10 mg via INTRAVENOUS
  Administered 2013-04-29: 20 mg via INTRAVENOUS

## 2013-04-29 MED ORDER — PHENYTOIN SODIUM EXTENDED 100 MG PO CAPS: 300.0000 mg | ORAL_CAPSULE | Freq: Every day | ORAL | Status: DC

## 2013-04-29 MED ORDER — MENTHOL 3 MG MT LOZG
1.0000 | LOZENGE | OROMUCOSAL | Status: DC | PRN
  Filled 2013-04-29: qty 9

## 2013-04-29 MED ORDER — BENZTROPINE MESYLATE 1 MG PO TABS
1.0000 mg | ORAL_TABLET | Freq: Two times a day (BID) | ORAL | Status: DC
  Administered 2013-04-29 – 2013-05-01 (×4): 1 mg via ORAL
  Filled 2013-04-29 (×5): qty 1

## 2013-04-29 MED ORDER — FENTANYL CITRATE 0.05 MG/ML IJ SOLN
INTRAMUSCULAR | Status: DC | PRN
  Administered 2013-04-29 (×3): 50 ug via INTRAVENOUS
  Administered 2013-04-29: 100 ug via INTRAVENOUS

## 2013-04-29 MED ORDER — METHOCARBAMOL 500 MG PO TABS
500.0000 mg | ORAL_TABLET | Freq: Four times a day (QID) | ORAL | Status: DC | PRN
  Administered 2013-04-30 (×2): 500 mg via ORAL
  Filled 2013-04-29 (×2): qty 1

## 2013-04-29 MED ORDER — ONDANSETRON HCL 4 MG/2ML IJ SOLN
INTRAMUSCULAR | Status: DC | PRN
  Administered 2013-04-29: 4 mg via INTRAVENOUS

## 2013-04-29 MED ORDER — PHENYTOIN SODIUM EXTENDED 100 MG PO CAPS
200.0000 mg | ORAL_CAPSULE | Freq: Every day | ORAL | Status: DC
  Administered 2013-04-29 – 2013-04-30 (×2): 200 mg via ORAL
  Filled 2013-04-29 (×3): qty 2

## 2013-04-29 MED ORDER — DOCUSATE SODIUM 100 MG PO CAPS
100.0000 mg | ORAL_CAPSULE | Freq: Two times a day (BID) | ORAL | Status: DC
  Administered 2013-04-29 – 2013-05-01 (×4): 100 mg via ORAL

## 2013-04-29 MED ORDER — LIDOCAINE HCL (PF) 2 % IJ SOLN
INTRAMUSCULAR | Status: DC | PRN
  Administered 2013-04-29: 50 mg

## 2013-04-29 MED ORDER — NEOSTIGMINE METHYLSULFATE 1 MG/ML IJ SOLN
INTRAMUSCULAR | Status: DC | PRN
  Administered 2013-04-29: 3.5 mg via INTRAVENOUS

## 2013-04-29 MED ORDER — LACTATED RINGERS IV SOLN: INTRAVENOUS | Status: DC

## 2013-04-29 MED ORDER — 0.9 % SODIUM CHLORIDE (POUR BTL) OPTIME
TOPICAL | Status: DC | PRN
  Administered 2013-04-29: 1000 mL

## 2013-04-29 MED ORDER — RISPERIDONE 3 MG PO TABS
3.0000 mg | ORAL_TABLET | Freq: Two times a day (BID) | ORAL | Status: DC
  Administered 2013-04-29 – 2013-05-01 (×4): 3 mg via ORAL
  Filled 2013-04-29 (×5): qty 1

## 2013-04-29 MED ORDER — FERROUS SULFATE 325 (65 FE) MG PO TABS
325.0000 mg | ORAL_TABLET | Freq: Three times a day (TID) | ORAL | Status: DC
  Administered 2013-04-30 – 2013-05-01 (×4): 325 mg via ORAL
  Filled 2013-04-29 (×8): qty 1

## 2013-04-29 MED ORDER — CEFAZOLIN SODIUM-DEXTROSE 2-3 GM-% IV SOLR
2.0000 g | Freq: Four times a day (QID) | INTRAVENOUS | Status: AC
  Administered 2013-04-29 – 2013-04-30 (×2): 2 g via INTRAVENOUS
  Filled 2013-04-29 (×2): qty 50

## 2013-04-29 MED ORDER — DEXAMETHASONE 6 MG PO TABS
10.0000 mg | ORAL_TABLET | Freq: Once | ORAL | Status: AC
  Administered 2013-04-30: 10 mg via ORAL
  Filled 2013-04-29: qty 1

## 2013-04-29 SURGICAL SUPPLY — 40 items
ADH SKN CLS APL DERMABOND .7 (GAUZE/BANDAGES/DRESSINGS) ×1
BAG SPEC THK2 15X12 ZIP CLS (MISCELLANEOUS) ×2
BAG ZIPLOCK 12X15 (MISCELLANEOUS) ×4 IMPLANT
BLADE SAW SGTL 18X1.27X75 (BLADE) ×2 IMPLANT
CLOTH BEACON ORANGE TIMEOUT ST (SAFETY) ×2 IMPLANT
DERMABOND ADVANCED (GAUZE/BANDAGES/DRESSINGS) ×1
DERMABOND ADVANCED .7 DNX12 (GAUZE/BANDAGES/DRESSINGS) ×1 IMPLANT
DRAPE C-ARM 42X72 X-RAY (DRAPES) ×2 IMPLANT
DRAPE STERI IOBAN 125X83 (DRAPES) ×2 IMPLANT
DRAPE U-SHAPE 47X51 STRL (DRAPES) ×6 IMPLANT
DRSG AQUACEL AG ADV 3.5X10 (GAUZE/BANDAGES/DRESSINGS) ×2 IMPLANT
DRSG TEGADERM 4X4.75 (GAUZE/BANDAGES/DRESSINGS) ×1 IMPLANT
DURAPREP 26ML APPLICATOR (WOUND CARE) ×2 IMPLANT
ELECT BLADE TIP CTD 4 INCH (ELECTRODE) ×2 IMPLANT
ELECT REM PT RETURN 9FT ADLT (ELECTROSURGICAL) ×2
ELECTRODE REM PT RTRN 9FT ADLT (ELECTROSURGICAL) ×1 IMPLANT
EVACUATOR 1/8 PVC DRAIN (DRAIN) IMPLANT
FACESHIELD LNG OPTICON STERILE (SAFETY) ×8 IMPLANT
GAUZE SPONGE 2X2 8PLY STRL LF (GAUZE/BANDAGES/DRESSINGS) ×1 IMPLANT
GLOVE BIOGEL PI IND STRL 7.5 (GLOVE) ×1 IMPLANT
GLOVE BIOGEL PI IND STRL 8 (GLOVE) ×1 IMPLANT
GLOVE BIOGEL PI INDICATOR 7.5 (GLOVE) ×1
GLOVE BIOGEL PI INDICATOR 8 (GLOVE) ×2
GLOVE ECLIPSE 8.0 STRL XLNG CF (GLOVE) ×2 IMPLANT
GLOVE ORTHO TXT STRL SZ7.5 (GLOVE) ×4 IMPLANT
GOWN BRE IMP PREV XXLGXLNG (GOWN DISPOSABLE) ×2 IMPLANT
GOWN STRL NON-REIN LRG LVL3 (GOWN DISPOSABLE) ×2 IMPLANT
KIT BASIN OR (CUSTOM PROCEDURE TRAY) ×2 IMPLANT
PACK TOTAL JOINT (CUSTOM PROCEDURE TRAY) ×2 IMPLANT
PADDING CAST COTTON 6X4 STRL (CAST SUPPLIES) ×2 IMPLANT
SPONGE GAUZE 2X2 STER 10/PKG (GAUZE/BANDAGES/DRESSINGS) ×1
SUCTION FRAZIER 12FR DISP (SUCTIONS) ×2 IMPLANT
SUT MNCRL AB 4-0 PS2 18 (SUTURE) ×2 IMPLANT
SUT VIC AB 1 CT1 36 (SUTURE) ×8 IMPLANT
SUT VIC AB 2-0 CT1 27 (SUTURE) ×4
SUT VIC AB 2-0 CT1 TAPERPNT 27 (SUTURE) ×2 IMPLANT
SUT VLOC 180 0 24IN GS25 (SUTURE) ×2 IMPLANT
TOWEL OR 17X26 10 PK STRL BLUE (TOWEL DISPOSABLE) ×4 IMPLANT
TRAY FOLEY CATH 14FRSI W/METER (CATHETERS) ×2 IMPLANT
WATER STERILE IRR 1500ML POUR (IV SOLUTION) ×2 IMPLANT

## 2013-04-29 NOTE — Preoperative (Signed)
Beta Blockers   Reason not to administer Beta Blockers:Not Applicable 

## 2013-04-29 NOTE — Anesthesia Preprocedure Evaluation (Signed)
Anesthesia Evaluation  Patient identified by MRN, date of birth, ID band Patient awake    Reviewed: Allergy & Precautions, H&P , NPO status , Patient's Chart, lab work & pertinent test results  Airway Mallampati: II TM Distance: >3 FB Neck ROM: Full  Mouth opening: Limited Mouth Opening  Dental  (+) Teeth Intact, Poor Dentition and Dental Advisory Given   Pulmonary neg pulmonary ROS, Current Smoker,  breath sounds clear to auscultation  Pulmonary exam normal       Cardiovascular negative cardio ROS  Rhythm:Regular Rate:Normal     Neuro/Psych Seizures -,  PSYCHIATRIC DISORDERS (Schizoaffective disorder) Schizophrenia negative neurological ROS  negative psych ROS   GI/Hepatic negative GI ROS, Neg liver ROS, (+)     substance abuse  alcohol use,   Endo/Other  negative endocrine ROS  Renal/GU negative Renal ROS  negative genitourinary   Musculoskeletal negative musculoskeletal ROS (+)   Abdominal   Peds  Hematology negative hematology ROS (+)   Anesthesia Other Findings   Reproductive/Obstetrics                           Anesthesia Physical Anesthesia Plan  ASA: III  Anesthesia Plan: General   Post-op Pain Management:    Induction: Intravenous  Airway Management Planned: Oral ETT  Additional Equipment:   Intra-op Plan:   Post-operative Plan: Extubation in OR  Informed Consent: I have reviewed the patients History and Physical, chart, labs and discussed the procedure including the risks, benefits and alternatives for the proposed anesthesia with the patient or authorized representative who has indicated his/her understanding and acceptance.   Dental advisory given  Plan Discussed with: CRNA  Anesthesia Plan Comments:         Anesthesia Quick Evaluation

## 2013-04-29 NOTE — Plan of Care (Signed)
Problem: Consults Goal: Diagnosis- Total Joint Replacement Left total anterior hip

## 2013-04-29 NOTE — Interval H&P Note (Signed)
History and Physical Interval Note:  04/29/2013 2:07 PM  [name]  has presented today for surgery, with the diagnosis of LEFT HIP AVASCULAR NORCROSIS  The various methods of treatment have been discussed with the patient and family. After consideration of risks, benefits and other options for treatment, the patient has consented to  Procedure(s): TOTAL HIP ARTHROPLASTY ANTERIOR APPROACH (Left) as a surgical intervention .  The patient's history has been reviewed, patient examined, no change in status, stable for surgery.  I have reviewed the patient's chart and labs.  Questions were answered to the patient's satisfaction.     Shelda Pal

## 2013-04-29 NOTE — Progress Notes (Signed)
Dr Charlann Boxer here, informed that pt is unsure of time today that he drank "liquid" from can of chicken noodle soup, and also unsure of time today that his medications were taken.  No new orders

## 2013-04-29 NOTE — Transfer of Care (Signed)
Immediate Anesthesia Transfer of Care Note  Patient: [name]  Procedure(s) Performed: Procedure(s): TOTAL HIP ARTHROPLASTY ANTERIOR APPROACH (Left)  Patient Location: PACU  Anesthesia Type:General  Level of Consciousness: awake, oriented and patient cooperative  Airway & Oxygen Therapy: Patient Spontanous Breathing and Patient connected to face mask oxygen  Post-op Assessment: Report given to PACU RN and Post -op Vital signs reviewed and stable  Post vital signs: Reviewed and stable  Complications: No apparent anesthesia complications

## 2013-04-30 DIAGNOSIS — D500 Iron deficiency anemia secondary to blood loss (chronic): Secondary | ICD-10-CM

## 2013-04-30 DIAGNOSIS — E663 Overweight: Secondary | ICD-10-CM

## 2013-04-30 LAB — BASIC METABOLIC PANEL
BUN: 5 mg/dL — ABNORMAL LOW (ref 6–23)
CO2: 30 mEq/L (ref 19–32)
Chloride: 101 mEq/L (ref 96–112)
Glucose, Bld: 100 mg/dL — ABNORMAL HIGH (ref 70–99)
Potassium: 4.4 mEq/L (ref 3.5–5.1)
Sodium: 135 mEq/L (ref 135–145)

## 2013-04-30 LAB — CBC
HCT: 27.6 % — ABNORMAL LOW (ref 39.0–52.0)
Hemoglobin: 9 g/dL — ABNORMAL LOW (ref 13.0–17.0)
MCHC: 32.6 g/dL (ref 30.0–36.0)
RBC: 2.99 MIL/uL — ABNORMAL LOW (ref 4.22–5.81)
WBC: 6 10*3/uL (ref 4.0–10.5)

## 2013-04-30 NOTE — Progress Notes (Signed)
Received orders for rw and commode.  Both delivered to hospital room.

## 2013-04-30 NOTE — Evaluation (Signed)
Physical Therapy Evaluation Patient Details Name: [name] MRN: [id number] DOB: 03/24/1976 Today's Date: 04/30/2013 Time: 4098-1191 PT Time Calculation (min): 37 min  PT Assessment / Plan / Recommendation Clinical Impression  Pt s/p R THR presents with decreased R LE strength/ROM and post op pain limiting functional mobility    PT Assessment  Patient needs continued PT services    Follow Up Recommendations  Home health PT    Does the patient have the potential to tolerate intense rehabilitation      Barriers to Discharge Decreased caregiver support Sister working during the day    Engineer, agricultural with 5" wheels    Recommendations for Other Services OT consult   Frequency 7X/week    Precautions / Restrictions Precautions Precautions: Fall Restrictions Weight Bearing Restrictions: No Other Position/Activity Restrictions: WBAT   Pertinent Vitals/Pain 9/10; premed, RN aware, ice pack provided      Mobility  Bed Mobility Bed Mobility: Supine to Sit Supine to Sit: 4: Min assist;3: Mod assist Details for Bed Mobility Assistance: cues for use of L LE to self assist and sequencing Transfers Transfers: Sit to Stand;Stand to Sit Sit to Stand: 4: Min assist;3: Mod assist;From elevated surface;From bed;With upper extremity assist Stand to Sit: 4: Min assist;With upper extremity assist;To chair/3-in-1;With armrests Details for Transfer Assistance: cues for LE management and use of UEs to self assist Ambulation/Gait Ambulation/Gait Assistance: 4: Min assist;3: Mod assist Ambulation Distance (Feet): 38 Feet Assistive device: Rolling walker Ambulation/Gait Assistance Details: cues for sequence, posture, position from RW, ER on R and stride length Gait Pattern: Step-to pattern;Decreased step length - right;Decreased step length - left;Decreased stance time - right;Antalgic    Exercises Total Joint Exercises Ankle Circles/Pumps: AROM;15  reps;Supine;Both Quad Sets: AROM;10 reps;Both;Supine Heel Slides: AAROM;15 reps;Supine;Right Hip ABduction/ADduction: AAROM;15 reps;Supine;Right   PT Diagnosis: Difficulty walking  PT Problem List: Decreased strength;Decreased range of motion;Decreased activity tolerance;Decreased mobility;Decreased knowledge of use of DME;Pain PT Treatment Interventions: DME instruction;Gait training;Stair training;Functional mobility training;Therapeutic activities;Therapeutic exercise;Patient/family education   PT Goals Acute Rehab PT Goals PT Goal Formulation: With patient Time For Goal Achievement: 05/07/13 Potential to Achieve Goals: Good Pt will go Supine/Side to Sit: with modified independence PT Goal: Supine/Side to Sit - Progress: Goal set today Pt will go Sit to Supine/Side: with modified independence PT Goal: Sit to Supine/Side - Progress: Goal set today Pt will go Sit to Stand: with modified independence PT Goal: Sit to Stand - Progress: Goal set today Pt will go Stand to Sit: with modified independence PT Goal: Stand to Sit - Progress: Goal set today Pt will Ambulate: 51 - 150 feet;with supervision;with rolling walker PT Goal: Ambulate - Progress: Goal set today Pt will Go Up / Down Stairs: 1-2 stairs;with min assist;with least restrictive assistive device PT Goal: Up/Down Stairs - Progress: Goal set today  Visit Information  Last PT Received On: 05/06/13 Assistance Needed: +1    Subjective Data  Subjective: I was using a cane but then I lost it Patient Stated Goal: Resume previous lifestyle with decreased pain   Prior Functioning  Home Living Lives With: Other (Comment) (sister) Available Help at Discharge: Family Type of Home: House Home Access: Stairs to enter Secretary/administrator of Steps: 1 Entrance Stairs-Rails: None Home Layout: Able to live on main level with bedroom/bathroom Home Adaptive Equipment: None Additional Comments: Sister states pt lives in basement but  she can put bed on main floor.  Sister states she works long hours and pt will  be alone during the day Prior Function Level of Independence: Independent Able to Take Stairs?: Yes Vocation: On disability Communication Communication: No difficulties    Cognition  Cognition Arousal/Alertness: Awake/alert Behavior During Therapy: WFL for tasks assessed/performed Overall Cognitive Status: Within Functional Limits for tasks assessed    Extremity/Trunk Assessment Right Upper Extremity Assessment RUE ROM/Strength/Tone: [facility] for tasks assessed Left Upper Extremity Assessment LUE ROM/Strength/Tone: WFL for tasks assessed Right Lower Extremity Assessment RLE ROM/Strength/Tone: [facility] for tasks assessed Left Lower Extremity Assessment LLE ROM/Strength/Tone: Deficits LLE ROM/Strength/Tone Deficits: 2/5 hip strength with AAROM at hip to 80 flex and 15 abd Trunk Assessment Trunk Assessment: Normal   Balance    End of Session PT - End of Session Equipment Utilized During Treatment: Gait belt Activity Tolerance: Patient tolerated treatment well Patient left: in chair;with call bell/phone within reach;with family/visitor present Nurse Communication: Mobility status  GP     [name] 04/30/2013, 12:48 PM

## 2013-04-30 NOTE — Op Note (Signed)
NAME:  [name]                ACCOUNT NO.: [id number]      MEDICAL RECORD NO.: [id number]      FACILITY:  [facility]      PHYSICIAN:  Durene Romans D  DATE OF BIRTH:  May 16, 1976     DATE OF PROCEDURE:  04/30/2013                                 OPERATIVE REPORT         PREOPERATIVE DIAGNOSIS: Left avascular necrosis.      POSTOPERATIVE DIAGNOSIS:  Left avascular necrosis.      PROCEDURE:  Left total hip replacement through an anterior approach   utilizing DePuy THR system, component size 52mm pinnacle cup, a size 36+4 neutral   Altrex liner, a size 9 Hi Tri Lock stem with a 36+1.5 delta ceramic   ball.      SURGEON:  Madlyn Frankel. Charlann Boxer, M.D.      ASSISTANT:  Duane Gins, PA-C     ANESTHESIA:  General.      SPECIMENS:  None.      COMPLICATIONS:  None.      BLOOD LOSS:  600 cc     DRAINS:  One Hemovac.      INDICATION OF THE PROCEDURE:  [name] is a 37 y.o. male who had   presented to office for evaluation of left hip pain.  Radiographs revealed   progressive avascular changes with bone-on-bone   articulation to the  hip joint and some early collapse.  The patient had painful limited range of   motion significantly affecting their overall quality of life.  The patient was failing to    respond to conservative measures, and at this point was ready   to proceed with more definitive measures.  The patient has noted progressive   degenerative changes in his hip, progressive problems and dysfunction   with regarding the hip prior to surgery.  Consent was obtained for   benefit of pain relief.  Specific risk of infection, DVT, component   failure, dislocation, need for revision surgery, as well discussion of   the anterior versus posterior approach were reviewed.  Consent was   obtained for benefit of anterior pain relief through an anterior   approach.      PROCEDURE IN DETAIL:  The patient was brought to operative theater.   Once adequate  anesthesia, preoperative antibiotics, 2gm Ancef administered.   The patient was positioned supine on the OSI Hanna table.  Once adequate   padding of boney process was carried out, we had predraped out the hip, and  used fluoroscopy to confirm orientation of the pelvis and position.      The left hip was then prepped and draped from proximal iliac crest to   mid thigh with shower curtain technique.      Time-out was performed identifying the patient, planned procedure, and   extremity.     An incision was then made 2 cm distal and lateral to the   anterior superior iliac spine extending over the orientation of the   tensor fascia lata muscle and sharp dissection was carried down to the   fascia of the muscle and protractor placed in the soft tissues.      The fascia was then incised.  The muscle belly was identified and  swept   laterally and retractor placed along the superior neck.  Following   cauterization of the circumflex vessels and removing some pericapsular   fat, a second cobra retractor was placed on the inferior neck.  A third   retractor was placed on the anterior acetabulum after elevating the   anterior rectus.  A L-capsulotomy was along the line of the   superior neck to the trochanteric fossa, then extended proximally and   distally.  Tag sutures were placed and the retractors were then placed   intracapsular.  We then identified the trochanteric fossa and   orientation of my neck cut, confirmed this radiographically   and then made a neck osteotomy with the femur on traction.  The femoral   head was removed without difficulty or complication.  Traction was let   off and retractors were placed posterior and anterior around the   acetabulum.      The labrum and foveal tissue were debrided.  I began reaming with a 47mm   reamer and reamed up to 51mm reamer with good bony bed preparation and a 52   cup was chosen.  The final 52mm Pinnacle cup was then impacted under  fluoroscopy  to confirm the depth of penetration and orientation with respect to   abduction.  A screw was placed followed by the hole eliminator.  The final   36+4 neutral Altrex liner was impacted with good visualized rim fit.  The cup was positioned anatomically within the acetabular portion of the pelvis.      At this point, the femur was rolled at 80 degrees.  Further capsule was   released off the inferior aspect of the femoral neck.  I then   released the superior capsule proximally.  The hook was placed laterally   along the femur and elevated manually and held in position with the bed   hook.  The leg was then extended and adducted with the leg rolled to 100   degrees of external rotation.  Once the proximal femur was fully   exposed, I used a box osteotome to set orientation.  I then began   broaching with the starting chili pepper broach and passed this by hand and then broached up to 9.  With the 9 broach in place I chose a high offset neck and did a trial reduction.  The offset was appropriate, leg lengths   appeared to be equal, confirmed radiographically.   Given these findings, I went ahead and dislocated the hip, repositioned all   retractors and positioned the right hip in the extended and abducted position.  The final 9 Hi Tri Lock stem was   chosen and it was impacted down to the level of neck cut.  Based on this   and the trial reduction, a 36+1.5 delta ceramic ball was chosen and   impacted onto a clean and dry trunnion, and the hip was reduced.  The   hip had been irrigated throughout the case again at this point.  I did   reapproximate the superior capsular leaflet to the anterior leaflet   using #1 Vicryl, placed a medium Hemovac drain deep.  The fascia of the   tensor fascia lata muscle was then reapproximated using #1 Vicryl.  The   remaining wound was closed with 2-0 Vicryl and running 4-0 Monocryl.   The hip was cleaned, dried, and dressed sterilely using  Dermabond and   Aquacel dressing.  Drain site dressed separately.  She was then brought   to recovery room in stable condition tolerating the procedure well.    Duane Gins, PA-C was present for the entirety of the case involved from   preoperative positioning, perioperative retractor management, general   facilitation of the case, as well as primary wound closure as assistant.            Madlyn Frankel Charlann Boxer, M.D.            MDO/MEDQ  D:  10/17/2011  T:  10/17/2011  Job:  [id number]      Electronically Signed by Durene Romans M.D. on 10/23/2011 09:15:38 AM

## 2013-04-30 NOTE — Progress Notes (Signed)
Physical Therapy Treatment Patient Details Name: [name] MRN: [id number] DOB: [birth year] Today's Date: 04/30/2013 Time: 4098-1191 PT Time Calculation (min): 17 min  PT Assessment / Plan / Recommendation Comments on Treatment Session       Follow Up Recommendations  Home health PT     Does the patient have the potential to tolerate intense rehabilitation     Barriers to Discharge        Equipment Recommendations  Rolling walker with 5" wheels    Recommendations for Other Services OT consult  Frequency 7X/week   Plan Discharge plan remains appropriate    Precautions / Restrictions Precautions Precautions: Fall Restrictions Weight Bearing Restrictions: No Other Position/Activity Restrictions: WBAT   Pertinent Vitals/Pain 6/10; premed, ice pack provided    Mobility  Bed Mobility Bed Mobility: Sit to Supine Sit to Supine: 4: Min assist Details for Bed Mobility Assistance: cues for use of L LE to self assist and sequencing Transfers Transfers: Sit to Stand;Stand to Sit Sit to Stand: 4: Min assist Stand to Sit: 4: Min assist Details for Transfer Assistance: cues for LE management and use of UEs to self assist Ambulation/Gait Ambulation/Gait Assistance: 4: Min assist Ambulation Distance (Feet): 222 Feet Assistive device: Rolling walker Ambulation/Gait Assistance Details: cues for initial sequence, posture, position from RW, to increase BOS and ER on L Gait Pattern: Step-to pattern;Step-through pattern;Decreased stance time - left;Narrow base of support    Exercises     PT Diagnosis:    PT Problem List:   PT Treatment Interventions:     PT Goals Acute Rehab PT Goals PT Goal Formulation: With patient Time For Goal Achievement: 05/07/13 Potential to Achieve Goals: Good Pt will go Supine/Side to Sit: with modified independence PT Goal: Supine/Side to Sit - Progress: Progressing toward goal Pt will go Sit to Supine/Side: with modified independence PT Goal: Sit  to Supine/Side - Progress: Progressing toward goal Pt will go Sit to Stand: with modified independence PT Goal: Sit to Stand - Progress: Progressing toward goal Pt will go Stand to Sit: with modified independence PT Goal: Stand to Sit - Progress: Progressing toward goal Pt will Ambulate: 51 - 150 feet;with supervision;with rolling walker PT Goal: Ambulate - Progress: Progressing toward goal Pt will Go Up / Down Stairs: 1-2 stairs;with min assist;with least restrictive assistive device PT Goal: Up/Down Stairs - Progress: Goal set today  Visit Information  Last PT Received On: 04/30/13 Assistance Needed: +1    Subjective Data  Subjective: It still hurts but not as bad as this morning Patient Stated Goal: Resume previous lifestyle with decreased pain   Cognition  Cognition Arousal/Alertness: Awake/alert Behavior During Therapy: WFL for tasks assessed/performed Overall Cognitive Status: Within Functional Limits for tasks assessed    Balance     End of Session PT - End of Session Equipment Utilized During Treatment: Gait belt Activity Tolerance: Patient tolerated treatment well Patient left: with call bell/phone within reach;with family/visitor present;in bed Nurse Communication: Mobility status   GP     [name] 04/30/2013, 4:59 PM

## 2013-04-30 NOTE — Progress Notes (Signed)
   Subjective: 1 Day Post-Op Procedure(s) (LRB): TOTAL HIP ARTHROPLASTY ANTERIOR APPROACH (Left)   Patient reports pain as mild, pain well controlled. No events throughout the night.  Objective:   VITALS:   Filed Vitals:   04/30/13 0616  BP: 111/65  Pulse: 101  Temp: 98.5 F (36.9 C)  Resp: 14    Neurovascular intact Dorsiflexion/Plantar flexion intact Incision: dressing C/D/I No cellulitis present Compartment soft  LABS  Recent Labs  04/30/13 0452  HGB 9.0*  HCT 27.6*  WBC 6.0  PLT 228     Recent Labs  04/30/13 0452  NA 135  K 4.4  BUN 5*  CREATININE 0.85  GLUCOSE 100*     Assessment/Plan: 1 Day Post-Op Procedure(s) (LRB): TOTAL HIP ARTHROPLASTY ANTERIOR APPROACH (Left) HV drain d/c'ed Foley cath d/c'ed Advance diet Up with therapy D/C IV fluids Discharge home with home health eventually, possibly tomorrow  Expected ABLA  Treated with iron and will observe  Overweight (BMI 25-29.9) Estimated body mass index is 26.78 kg/(m^2) as calculated from the following:   Height as of this encounter: 5\' 4"  (1.626 m).   Weight as of this encounter: 70.8 kg (156 lb 1.4 oz). Patient also counseled that weight may inhibit the healing process Patient counseled that losing weight will help with future health issues      [name]. [employer]   PAC  04/30/2013, 9:48 AM

## 2013-04-30 NOTE — Care Management Note (Signed)
    Page 1 of 2   05/01/2013     1:04:52 PM   CARE MANAGEMENT NOTE 05/01/2013  Patient:  [name],[name]   Account Number:  [id number]  Date Initiated:  04/30/2013  Documentation initiated by:  Colleen Can  Subjective/Objective Assessment:   dx avascular necrosis; total hip replacemnt rt- anterior approach     Action/Plan:   CM spoke with patient and caregiver. Plans are for patient to return to home where he lives with sister in [place] . Sister will be caregiver.   Anticipated DC Date:  05/01/2013   Anticipated DC Plan:  HOME W HOME HEALTH SERVICES      DC Planning Services  CM consult      [facility] Choice  HOME HEALTH  DURABLE MEDICAL EQUIPMENT   Choice offered to / List presented to:  C-5 Sibling   DME arranged  [facility] ROLLING  3-N-1      DME agency  [facility]     HH arranged  HH-2 PT      [facility] agency  [facility]   Status of service:  Completed, signed off Medicare Important Message given?  NA - LOS <3 / Initial given by admissions (If response is "NO", the following Medicare IM given date fields will be blank) Date Medicare IM given:   Date Additional Medicare IM given:    Discharge Disposition:  HOME W HOME HEALTH SERVICES  Per UR Regulation:  Reviewed for med. necessity/level of care/duration of stay  If discussed at Long Length of Stay Meetings, dates discussed:    Comments:  05/01/2013 Colleen Can BSN RN CCM [phone] PT DISCHARGED TODAY WITH HH SERVICES IN PLACE. hhPT SERVICES PER [facility] WILL START TOMORROW. No orders or need for hospital bed per PA.   04/30/2013 Colleen Can BSN RN CCM [phone] [facility] can provide HHpt services with start date of day after discharge. RW has been delivered to patient's room. CAREGIVER IS REQUESTING HOSPITAL BED. WILL NEED ORDER FROM MD OR PA.tHANKS!

## 2013-05-01 ENCOUNTER — Encounter (HOSPITAL_COMMUNITY): Payer: Self-pay | Admitting: Orthopedic Surgery

## 2013-05-01 LAB — CBC
HCT: 28 % — ABNORMAL LOW (ref 39.0–52.0)
MCHC: 32.1 g/dL (ref 30.0–36.0)
MCV: 92.4 fL (ref 78.0–100.0)
RDW: 14.6 % (ref 11.5–15.5)

## 2013-05-01 LAB — BASIC METABOLIC PANEL
BUN: 8 mg/dL (ref 6–23)
Calcium: 9 mg/dL (ref 8.4–10.5)
Creatinine, Ser: 0.85 mg/dL (ref 0.50–1.35)
GFR calc Af Amer: >90 mL/min (ref >90)
GFR calc non Af Amer: >90 mL/min (ref >90)

## 2013-05-01 MED ORDER — DSS 100 MG PO CAPS: 100.0000 mg | ORAL_CAPSULE | Freq: Two times a day (BID) | ORAL | Status: DC

## 2013-05-01 MED ORDER — HYDROCODONE-ACETAMINOPHEN 7.5-325 MG PO TABS: 1.0000 | ORAL_TABLET | ORAL | Status: DC | PRN

## 2013-05-01 MED ORDER — ASPIRIN 325 MG PO TBEC: 325.0000 mg | DELAYED_RELEASE_TABLET | Freq: Two times a day (BID) | ORAL | Status: DC

## 2013-05-01 MED ORDER — FERROUS SULFATE 325 (65 FE) MG PO TABS: 325.0000 mg | ORAL_TABLET | Freq: Three times a day (TID) | ORAL | Status: DC

## 2013-05-01 MED ORDER — POLYETHYLENE GLYCOL 3350 17 G PO PACK: 17.0000 g | PACK | Freq: Two times a day (BID) | ORAL | Status: DC

## 2013-05-01 NOTE — Progress Notes (Signed)
   Subjective: 2 Days Post-Op Procedure(s) (LRB): TOTAL HIP ARTHROPLASTY ANTERIOR APPROACH (Left)   Patient reports pain as mild, pain well controlled. No events throughout the night. Ready to be discharged home.  Objective:   VITALS:   Filed Vitals:   05/01/13 0802  BP: 114/69  Pulse: 90  Temp: 98.5 F (36.9 C)  Resp: 16    Neurovascular intact Dorsiflexion/Plantar flexion intact Incision: dressing C/D/I No cellulitis present Compartment soft  LABS  Recent Labs  04/30/13 0452 05/01/13 0534  HGB 9.0* 9.0*  HCT 27.6* 28.0*  WBC 6.0 8.1  PLT 228 224     Recent Labs  04/30/13 0452 05/01/13 0534  NA 135 136  K 4.4 4.5  BUN 5* 8  CREATININE 0.85 0.85  GLUCOSE 100* 156*     Assessment/Plan: 2 Days Post-Op Procedure(s) (LRB): TOTAL HIP ARTHROPLASTY ANTERIOR APPROACH (Left) Up with therapy Discharge home with home health Follow up in 2 weeks at [facility]. Follow up with OLIN,Homer Pfeifer D in 2 weeks.  Contact information:  [facility] [address] [place] [address] [phone]    Expected ABLA  Treated with iron and will observe   Overweight (BMI 25-29.9)  Estimated body mass index is 26.78 kg/(m^2) as calculated from the following:      Height as of this encounter: 5\' 4"  (1.626 m).      Weight as of this encounter: 70.8 kg (156 lb 1.4 oz).  Patient also counseled that weight may inhibit the healing process  Patient counseled that losing weight will help with future health issues     .    [name] [name]   PAC  05/01/2013, 8:07 AM

## 2013-05-01 NOTE — Evaluation (Signed)
Occupational Therapy Evaluation Patient Details Name: [name] MRN: [id number] DOB: Jul 07, 1976 Today's Date: 05/01/2013 Time: 6213-0865 OT Time Calculation (min): 23 min  OT Assessment / Plan / Recommendation Clinical Impression  Pt is s/p L direct anterior THA and currently displays increased pain with activity and decreased strength overall. He will benefit from skilled OT services to improve ADL independence.     OT Assessment  Patient needs continued OT Services    Follow Up Recommendations  No OT follow up;Supervision - Intermittent    Barriers to Discharge      Equipment Recommendations  3 in 1 bedside comode    Recommendations for Other Services    Frequency  Min 2X/week    Precautions / Restrictions Precautions Precautions: Fall Restrictions Weight Bearing Restrictions: No Other Position/Activity Restrictions: WBAT        ADL  Eating/Feeding: Simulated;Independent Where Assessed - Eating/Feeding: Chair Grooming: Simulated;Wash/dry hands;Set up Where Assessed - Grooming: Supported sitting Upper Body Bathing: Simulated;Chest;Right arm;Left arm;Abdomen;Set up Where Assessed - Upper Body Bathing: Unsupported sitting Lower Body Bathing: Simulated;Minimal assistance Where Assessed - Lower Body Bathing: Supported sit to stand Upper Body Dressing: Simulated;Set up Where Assessed - Upper Body Dressing: Unsupported sitting Lower Body Dressing: Simulated;Minimal assistance Where Assessed - Lower Body Dressing: Supported sit to stand Toilet Transfer: Performed;Min guard Statistician Method: Other (comment) (with walker into bathroom) Toilet Transfer Equipment: Raised toilet seat with arms (or 3-in-1 over toilet) Toileting - Clothing Manipulation and Hygiene: Simulated;Min guard Where Assessed - Glass blower/designer Manipulation and Hygiene: Standing Equipment Used: Rolling walker ADL Comments: Pt states he will plan to sponge bathe initially as his shower is really  small and cant fit 3in1 in shower. Issued AE to pt (has Medicaid) and explained use of all AE. Pt verbalized understanding. Pt states he will have some family in and out duiring the day.    OT Diagnosis: Generalized weakness  OT Problem List: Decreased strength OT Treatment Interventions: Self-care/ADL training;Therapeutic activities;Patient/family education;DME and/or AE instruction   OT Goals Acute Rehab OT Goals OT Goal Formulation: With patient Time For Goal Achievement: 05/08/13 Potential to Achieve Goals: Good ADL Goals Pt Will Perform Grooming: with supervision;Standing at sink ADL Goal: Grooming - Progress: Goal set today Pt Will Perform Lower Body Bathing: with supervision;with adaptive equipment;Sit to stand from chair;Sit to stand from bed ADL Goal: Lower Body Bathing - Progress: Goal set today Pt Will Perform Lower Body Dressing: with supervision;with adaptive equipment;Sit to stand from bed;Sit to stand from chair ADL Goal: Lower Body Dressing - Progress: Goal set today Pt Will Transfer to Toilet: with supervision;Ambulation;3-in-1;with DME ADL Goal: Toilet Transfer - Progress: Goal set today Pt Will Perform Toileting - Clothing Manipulation: with supervision;Standing ADL Goal: Toileting - Clothing Manipulation - Progress: Goal set today  Visit Information  Last OT Received On: 05/01/13 Assistance Needed: +1    Subjective Data  Subjective: doing ok Patient Stated Goal: none stated. agreeable to OT   Prior Functioning     Home Living Lives With: Other (Comment) (sister) Available Help at Discharge: Family Type of Home: House Home Access: Stairs to enter Secretary/administrator of Steps: 1 Entrance Stairs-Rails: None Home Layout: Able to live on main level with bedroom/bathroom Bathroom Shower/Tub: Health visitor: Standard Home Adaptive Equipment: None Additional Comments: Sister states pt lives in basement but she can put bed on main floor.   Sister states she works long hours and pt will be alone during the day Prior Function Level of  Independence: Independent Able to Take Stairs?: Yes Vocation: On disability Communication Communication: No difficulties         Vision/Perception     Cognition  Cognition Arousal/Alertness: Awake/alert Behavior During Therapy: WFL for tasks assessed/performed Overall Cognitive Status: Within Functional Limits for tasks assessed    Extremity/Trunk Assessment Right Upper Extremity Assessment RUE ROM/Strength/Tone: WFL for tasks assessed Left Upper Extremity Assessment LUE ROM/Strength/Tone: WFL for tasks assessed     Mobility Bed Mobility Bed Mobility: Supine to Sit Supine to Sit: 5: Supervision;HOB elevated Transfers Transfers: Sit to Stand;Stand to Sit Sit to Stand: 4: Min guard;With upper extremity assist;From bed;From chair/3-in-1 Stand to Sit: 4: Min guard;To chair/3-in-1 Details for Transfer Assistance: min verbal cues for hand placement     Exercise     Balance     End of Session OT - End of Session Activity Tolerance: Patient tolerated treatment well Patient left: in chair;with call bell/phone within reach  GO     [name] [phone] 05/01/2013, 9:08 AM

## 2013-05-01 NOTE — Discharge Summary (Signed)
Physician Discharge Summary  Patient ID: [name] MRN: [id number] DOB/AGE: 1976/02/11 37 y.o.  Admit date: 04/29/2013 Discharge date: 05/01/2013   Procedures:  Procedure(s) (LRB): TOTAL HIP ARTHROPLASTY ANTERIOR APPROACH (Left)  Attending Physician:  Dr. Durene Romans   Admission Diagnoses:   Left hip OA / pain  Discharge Diagnoses:  Principal Problem:   S/P left THA, AA Active Problems:   Expected blood loss anemia   Overweight (BMI 25.0-29.9)  Past Medical History  Diagnosis Date  . Substance abuse     has not used for 5-6 yrs  . Avascular necrosis of hip     left  . Schizophrenia   . Seizures     HX [place] seiaures - last seizure 6-7 months ago  . Avascular necrosis     L HIP  . Arthritis     hands    HPI:   [name], 37 y.o. male, has a history of pain and functional disability in the left hip(s) due to arthritis / AVN and patient has failed non-surgical conservative treatments for greater than 12 weeks to include NSAID's and/or analgesics and activity modification. Onset of symptoms was gradual starting 2 years ago with rapidlly worsening course since that time.The patient noted no past surgery on the left hip(s). Patient currently rates pain in the left hip at 9 out of 10 with activity. Patient has night pain, worsening of pain with activity and weight bearing, trendelenberg gait, pain that interfers with activities of daily living and pain with passive range of motion. Patient has evidence of periarticular osteophytes and joint space narrowing by imaging studies. This condition presents safety issues increasing the risk of falls. There is no current active infection. Risks, benefits and expectations were discussed with the patient. Patient understand the risks, benefits and expectations and wishes to proceed with surgery.   PCP: Nilda Simmer, MD   Discharged Condition: good  Hospital Course:  Patient underwent the above stated procedure on 04/29/2013. Patient  tolerated the procedure well and brought to the recovery room in good condition and subsequently to the floor.  POD #1 BP: 111/65 ; Pulse: 101 ; Temp: 98.5 F (36.9 C) ; Resp: 14 Pt's foley was removed, as well as the hemovac drain removed. IV was changed to a saline lock. Patient reports pain as mild, pain well controlled. No events throughout the night. Neurovascular intact, dorsiflexion/plantar flexion intact, incision: dressing C/D/I, no cellulitis present and compartment soft.   LABS  Basename  04/30/13    0452   HGB  9.0  HCT  27.6   POD #2  BP: 114/69 ; Pulse: 90 ; Temp: 98.5 F (36.9 C) ; Resp: 16  Patient reports pain as mild, pain well controlled. No events throughout the night. Ready to be discharged home.  Neurovascular intact, dorsiflexion/plantar flexion intact, incision: dressing C/D/I, no cellulitis present and compartment soft.   LABS  Basename  05/01/13    0534   HGB  9.0  HCT  28.0    Discharge Exam: General appearance: alert, cooperative and no distress Extremities: Homans sign is negative, no sign of DVT, no edema, redness or tenderness in the calves or thighs and no ulcers, gangrene or trophic changes  Disposition:   Home or Self Care with follow up in 2 weeks   Follow-up Information   Follow up with Shelda Pal, MD. Schedule an appointment as soon as possible for a visit in 2 weeks.   Contact information:   [address], [address] [place] [address] [phone]       Discharge Orders   Future Orders Complete By Expires     Call MD / Call 911  As directed     Comments:      If you experience chest pain or shortness of breath, CALL 911 and be transported to the hospital emergency room.  If you develope a fever above 101 F, pus (white drainage) or increased drainage or redness at the wound, or calf pain, call your surgeon's office.    Change dressing  As directed     Comments:      Maintain surgical dressing for 10-14 days, then replace  with 4x4 guaze and tape. Keep the area dry and clean.    Constipation Prevention  As directed     Comments:      Drink plenty of fluids.  Prune juice may be helpful.  You may use a stool softener, such as Colace (over the counter) 100 mg twice a day.  Use MiraLax (over the counter) for constipation as needed.    Diet - low sodium heart healthy  As directed     Discharge instructions  As directed     Comments:      Maintain surgical dressing for 10-14 days, then replace with gauze and tape. Keep the area dry and clean until follow up. Follow up in 2 weeks at [facility]. Call with any questions or concerns.    Increase activity slowly as tolerated  As directed     TED hose  As directed     Comments:      Use stockings (TED hose) for 2 weeks on both leg(s).  You may remove them at night for sleeping.    Weight bearing as tolerated  As directed          Medication List    STOP taking these medications       HYDROcodone-acetaminophen 5-325 MG per tablet  Commonly known as:  NORCO/VICODIN      TAKE these medications       aspirin 325 MG EC tablet  Take 1 tablet (325 mg total) by mouth 2 (two) times daily.     benztropine 1 MG tablet  Commonly known as:  COGENTIN  Take 1 mg by mouth 2 (two) times daily.     DSS 100 MG Caps  Take 100 mg by mouth 2 (two) times daily.     ferrous sulfate 325 (65 FE) MG tablet  Take 1 tablet (325 mg total) by mouth 3 (three) times daily after meals.     HYDROcodone-acetaminophen 7.5-325 MG per tablet  Commonly known as:  NORCO  Take 1-2 tablets by mouth every 4 (four) hours as needed for pain.     phenytoin 100 MG ER capsule  Commonly known as:  DILANTIN  Take 300 mg by mouth at bedtime. TAKES ONE IN THE AM AND 2 IN THE PM     polyethylene glycol packet  Commonly known as:  MIRALAX / GLYCOLAX  Take 17 g by mouth 2 (two) times daily.     risperiDONE 3 MG tablet  Commonly known as:  RISPERDAL  Take 3 mg by mouth 2 (two) times  daily.     sertraline 100 MG tablet  Commonly known as:  ZOLOFT  Take 100 mg by mouth every morning.     traZODone 100 MG tablet  Commonly known as:  DESYREL  Take 200 mg by mouth at bedtime.  Signed: Anastasio Auerbach. Edward Trevino   PAC  05/01/2013, 10:57 AM

## 2013-05-02 NOTE — Anesthesia Postprocedure Evaluation (Signed)
Anesthesia Post Note  Patient: [name]  Procedure(s) Performed: Procedure(s) (LRB): TOTAL HIP ARTHROPLASTY ANTERIOR APPROACH (Left)  Anesthesia type: General  Patient location: PACU  Post pain: Pain level controlled  Post assessment: Post-op Vital signs reviewed  Last Vitals:  Filed Vitals:   05/01/13 0802  BP: 114/69  Pulse: 90  Temp: 36.9 C  Resp: 16    Post vital signs: Reviewed  Level of consciousness: sedated  Complications: No apparent anesthesia complications

## 2013-05-18 ENCOUNTER — Emergency Department (HOSPITAL_COMMUNITY)
Admission: EM | Admit: 2013-05-18 | Discharge: 2013-05-18 | Disposition: A | Discharge status: Home / Self Care | Payer: Medicaid Other | Attending: Emergency Medicine | Admitting: Emergency Medicine

## 2013-05-18 DIAGNOSIS — Z7982 Long term (current) use of aspirin: Secondary | ICD-10-CM | POA: Insufficient documentation

## 2013-05-18 DIAGNOSIS — F17200 Nicotine dependence, unspecified, uncomplicated: Secondary | ICD-10-CM | POA: Insufficient documentation

## 2013-05-18 DIAGNOSIS — G40909 Epilepsy, unspecified, not intractable, without status epilepticus: Secondary | ICD-10-CM | POA: Insufficient documentation

## 2013-05-18 DIAGNOSIS — Z79899 Other long term (current) drug therapy: Secondary | ICD-10-CM | POA: Insufficient documentation

## 2013-05-18 DIAGNOSIS — Z96649 Presence of unspecified artificial hip joint: Secondary | ICD-10-CM | POA: Insufficient documentation

## 2013-05-18 DIAGNOSIS — Z8739 Personal history of other diseases of the musculoskeletal system and connective tissue: Secondary | ICD-10-CM | POA: Insufficient documentation

## 2013-05-18 DIAGNOSIS — M129 Arthropathy, unspecified: Secondary | ICD-10-CM | POA: Insufficient documentation

## 2013-05-18 DIAGNOSIS — M25559 Pain in unspecified hip: Secondary | ICD-10-CM | POA: Insufficient documentation

## 2013-05-18 DIAGNOSIS — M25552 Pain in left hip: Secondary | ICD-10-CM

## 2013-05-18 DIAGNOSIS — F209 Schizophrenia, unspecified: Secondary | ICD-10-CM | POA: Insufficient documentation

## 2013-05-18 NOTE — ED Provider Notes (Signed)
History     CSN: [id number]  Arrival date & time 05/18/13  0023   None     No chief complaint on file.   (Consider location/radiation/quality/duration/timing/severity/associated sxs/prior treatment) HPI History provided by patient, left hip pain s/p surgery due to avascular necrosis. He has pain pills at home that he has been taking but feel like hip pain is worse tonight. Ambulating without difficulty. No fall or trauma. No F/C, no rash or hip/ leg swelling. PT initially requesting pain medications, then after a period of time, admits that he called EMS for a ride and that all he really wants is a bus ticket.    Past Medical History  Diagnosis Date  . Substance abuse     has not used for 5-6 yrs  . Avascular necrosis of hip     left  . Schizophrenia   . Seizures     HX [place] seiaures - last seizure 6-7 months ago  . Avascular necrosis     L HIP  . Arthritis     hands    Past Surgical History  Procedure Laterality Date  . No past surgeries    . Total hip arthroplasty Left 04/29/2013    Procedure: TOTAL HIP ARTHROPLASTY ANTERIOR APPROACH;  Surgeon: Shelda Pal, MD;  Location: [facility];  Service: Orthopedics;  Laterality: Left;    Family History  Problem Relation Age of Onset  . Cancer Brother   . Diabetes Mother   . Hypertension Mother   . Cancer Father   . Hypertension Father   . Hypertension Sister   . Diabetes Maternal Grandmother     History  Substance Use Topics  . Smoking status: Current Every Day Smoker -- 5.00 packs/day for 5 years    Types: Cigarettes  . Smokeless tobacco: Never Used  . Alcohol Use: 0.0 oz/week     Comment: PT STATES 4 cans beer daily -HIS SISTER SAYS HE HAS ALCOHOLISM-SHE IS CONCERNED ABOUT WITHDRAL AFTER SURGERY      Review of Systems  Constitutional: Negative for fever and chills.  HENT: Negative for neck pain and neck stiffness.   Eyes: Negative for pain.  Respiratory: Negative for shortness of breath.   Cardiovascular:  Negative for chest pain.  Gastrointestinal: Negative for abdominal pain.  Genitourinary: Negative for dysuria.  Musculoskeletal: Negative for back pain.  Skin: Negative for rash.  Neurological: Negative for headaches.  All other systems reviewed and are negative.    Allergies  Review of patient's allergies indicates no known allergies.  Home Medications   Current Outpatient Rx  Name  Route  Sig  Dispense  Refill  . aspirin EC 325 MG EC tablet   Oral   Take 1 tablet (325 mg total) by mouth 2 (two) times daily.   30 tablet      . benztropine (COGENTIN) 1 MG tablet   Oral   Take 1 mg by mouth 2 (two) times daily.         Marland Kitchen docusate sodium 100 MG CAPS   Oral   Take 100 mg by mouth 2 (two) times daily.   10 capsule      . ferrous sulfate 325 (65 FE) MG tablet   Oral   Take 1 tablet (325 mg total) by mouth 3 (three) times daily after meals.         Marland Kitchen HYDROcodone-acetaminophen (NORCO) 7.5-325 MG per tablet   Oral   Take 1-2 tablets by mouth every 4 (four) hours as  needed for pain.   120 tablet   0   . phenytoin (DILANTIN) 100 MG ER capsule   Oral   Take 300 mg by mouth at bedtime. TAKES ONE IN THE AM AND 2 IN THE PM         . polyethylene glycol (MIRALAX / GLYCOLAX) packet   Oral   Take 17 g by mouth 2 (two) times daily.   14 each      . risperiDONE (RISPERDAL) 3 MG tablet   Oral   Take 3 mg by mouth 2 (two) times daily.         . sertraline (ZOLOFT) 100 MG tablet   Oral   Take 100 mg by mouth every morning.         . traZODone (DESYREL) 100 MG tablet   Oral   Take 200 mg by mouth at bedtime.            There were no vitals taken for this visit.  Physical Exam  Constitutional: He is oriented to person, place, and time. He appears well-developed and well-nourished.  HENT:  Head: Normocephalic and atraumatic.  Eyes: EOM are normal. Pupils are equal, round, and reactive to light.  Neck: Neck supple.  Cardiovascular: Normal rate, regular  rhythm and intact distal pulses.   Pulmonary/Chest: Effort normal and breath sounds normal. No respiratory distress.  Abdominal: Soft. Bowel sounds are normal. He exhibits no distension. There is no tenderness.  Musculoskeletal: Normal range of motion. He exhibits no edema.  LLE with healing surgical scar over left hip, no deformity with distal N/V intact, pelvis stable.   Neurological: He is alert and oriented to person, place, and time.  Skin: Skin is warm and dry.    ED Course  Procedures (including critical care time)  PT declines imaging or work up at this time, discharged home with bus ticket. Return precautions provided and verbalized as understood.   MDM  L hip pain recent replacement, followed by Ortho PT declines ED work up VS and nursing notes reviewed       Sunnie Nielsen, MD 05/18/13 [phone]

## 2013-10-11 IMAGING — CR DG CHEST 2V
2 series · 2 of 2 positions shown · non-contrast
Comparison: 08/15/2005

CLINICAL DATA: Hip pain.  Chest pain.  Medical clearance
radiograph.

CHEST - 2 VIEW

[w chest pa]
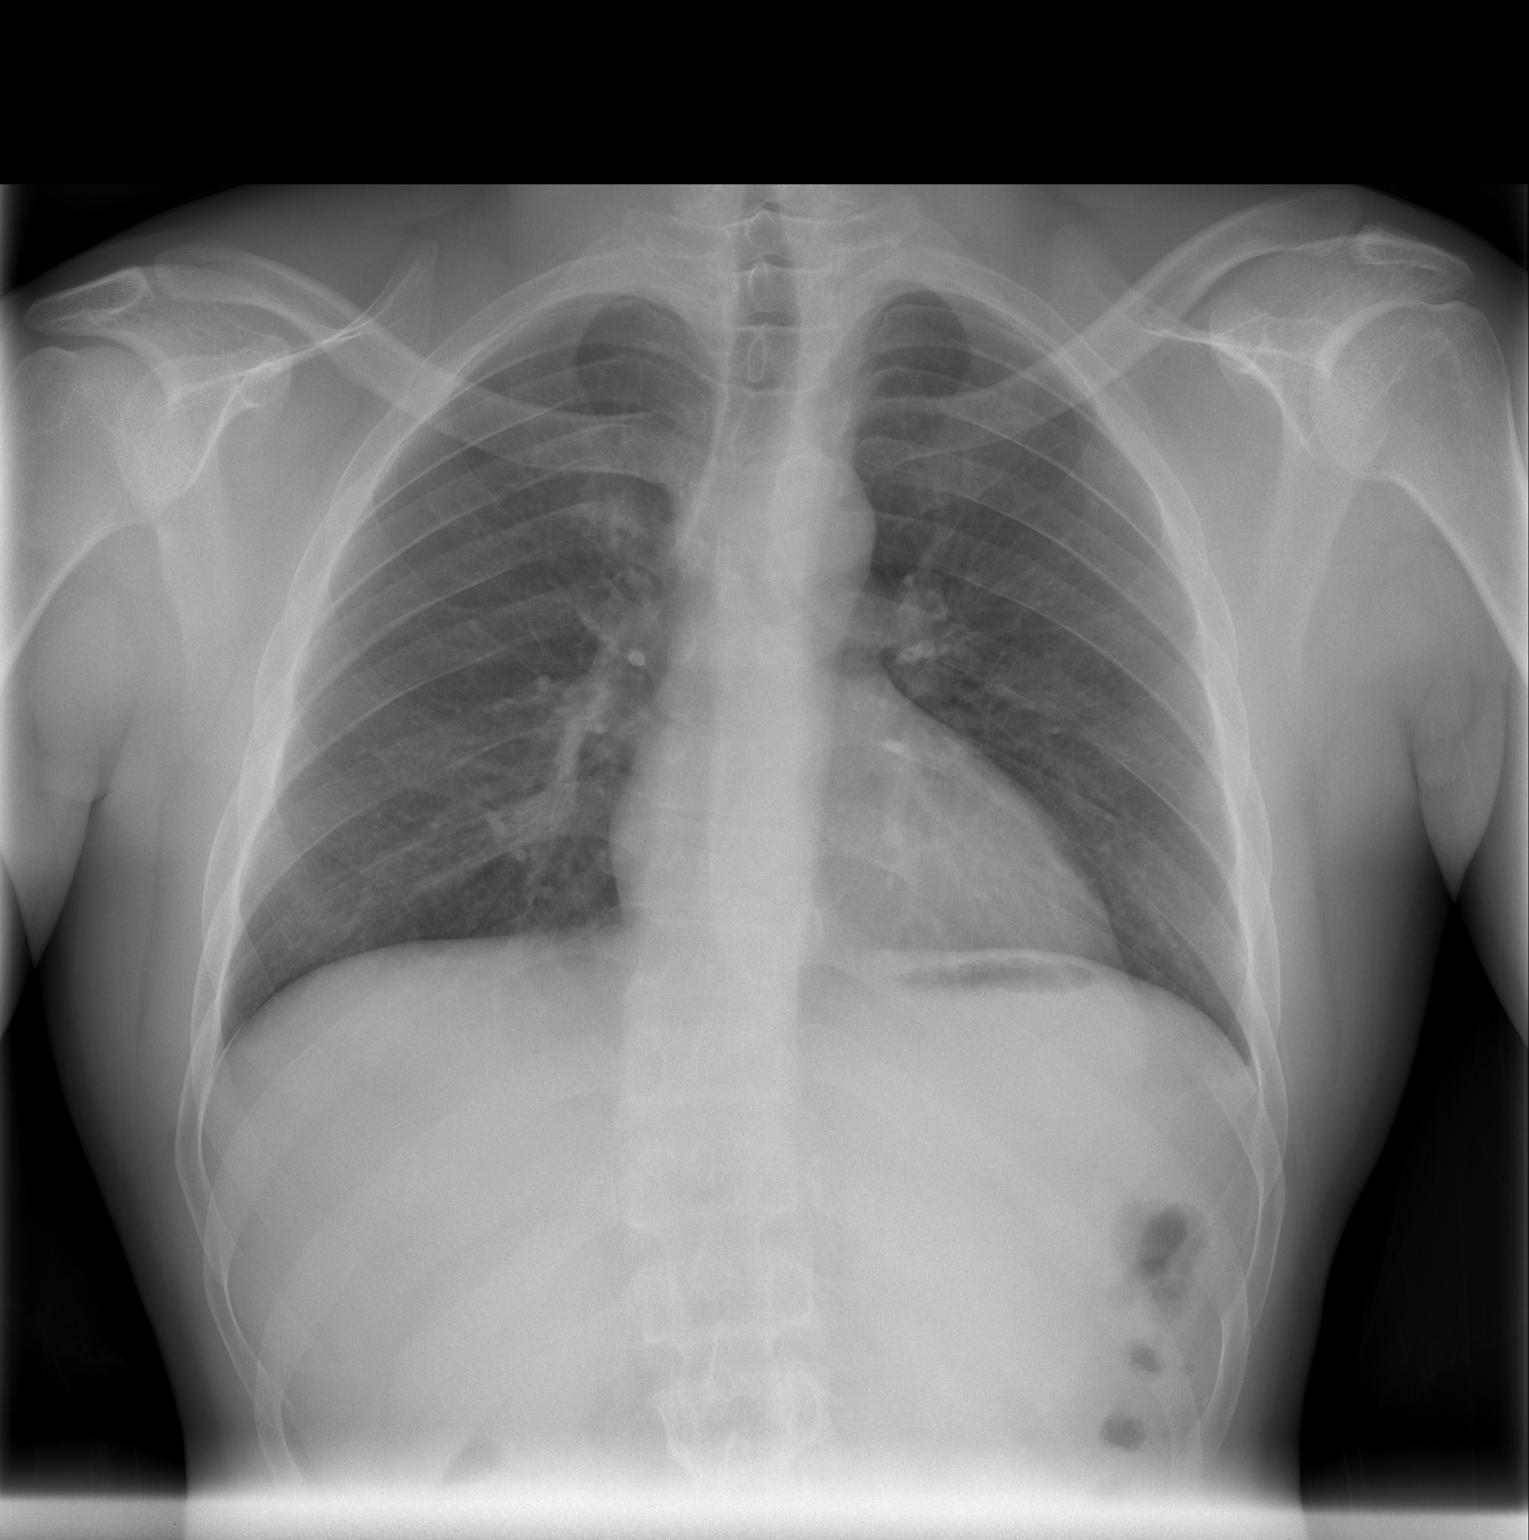

[w chest lat]
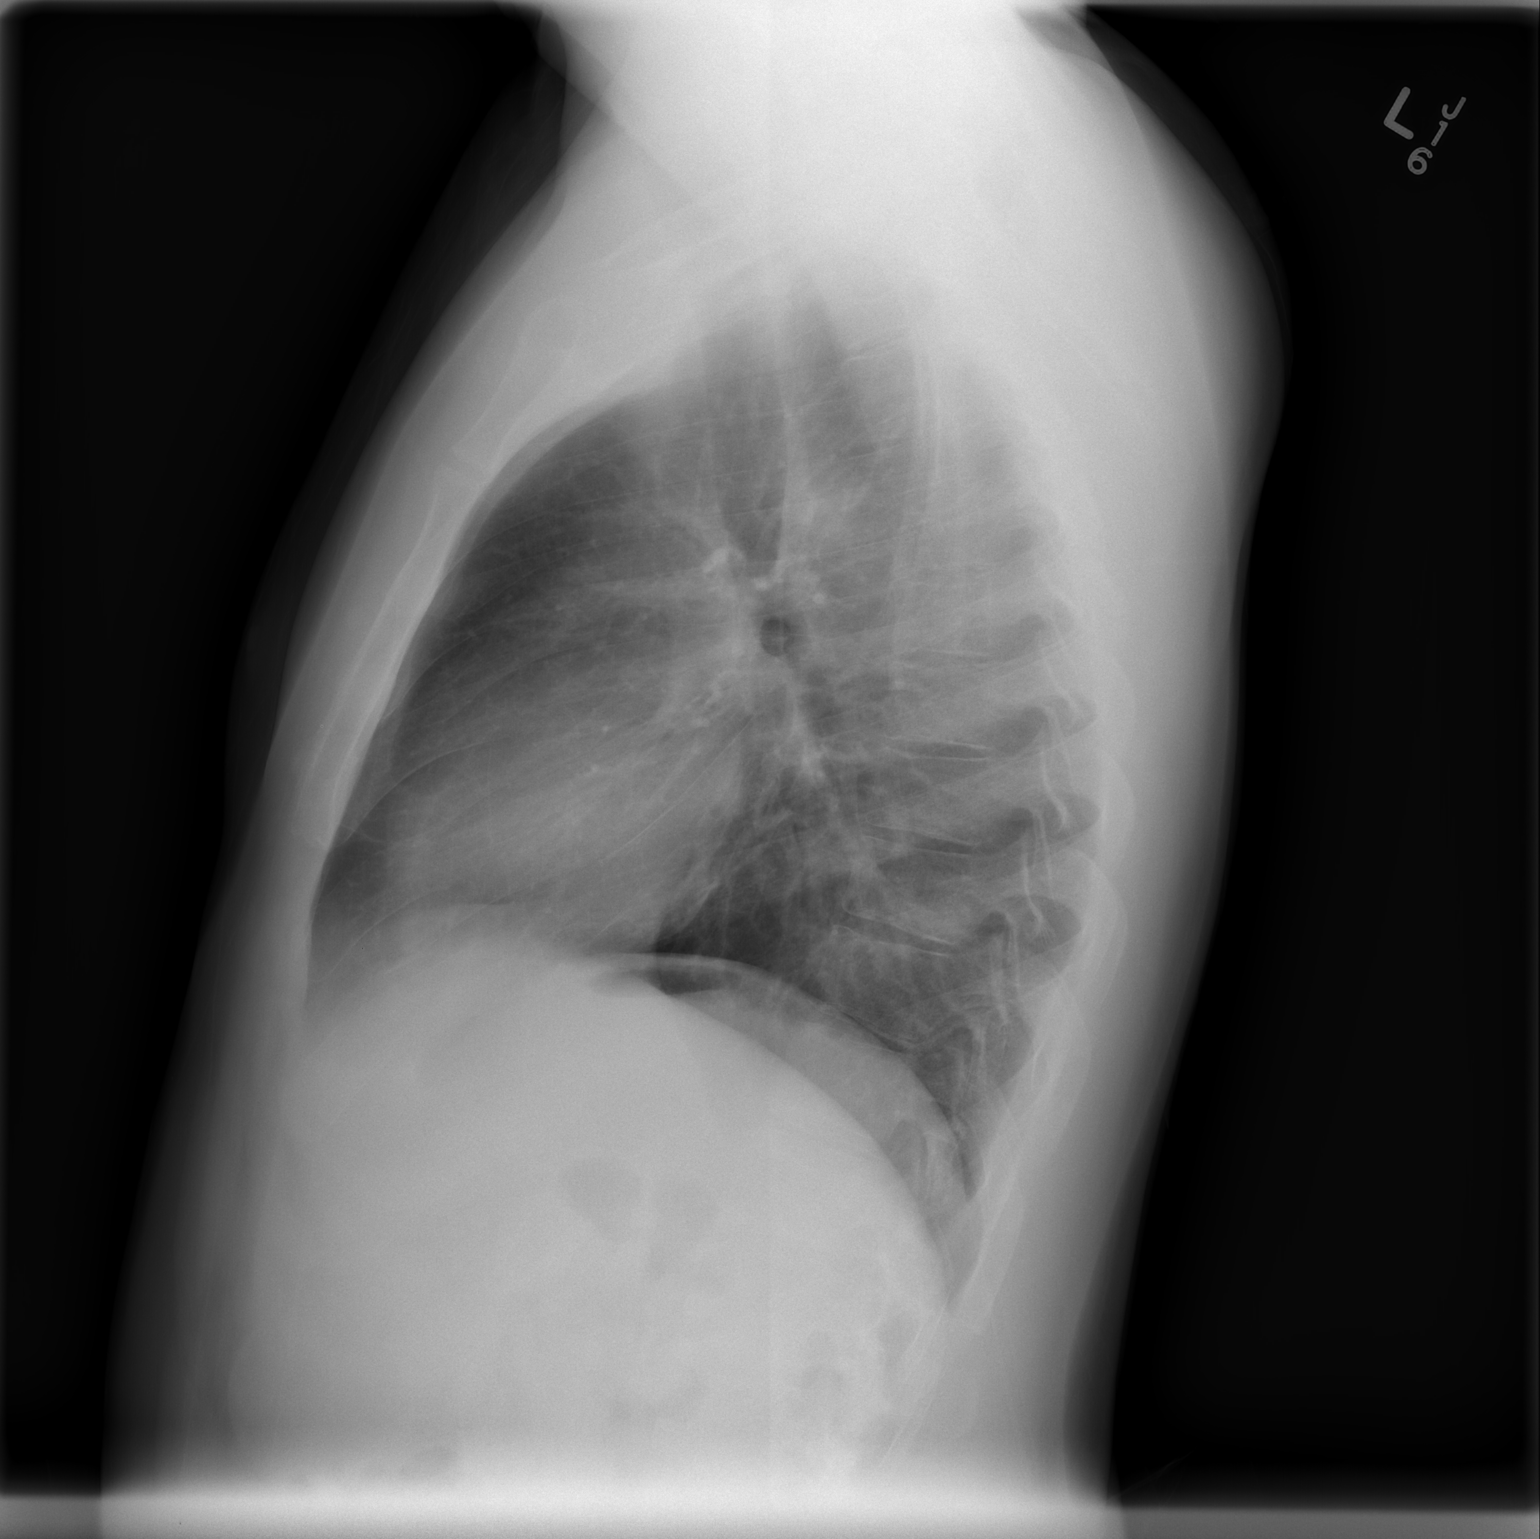

[2 of 2 positions shown; findings below may reference images not displayed]

FINDINGS: Heart size appears normal.  No pleural effusion or edema
identified.
No airspace consolidation identified.

Review of the visualized osseous structures is significant for a
scoliosis deformity which is convex to the right.
IMPRESSION: 1.  No acute cardiopulmonary abnormalities.

## 2014-06-26 ENCOUNTER — Encounter (HOSPITAL_COMMUNITY): Payer: Self-pay | Admitting: Emergency Medicine

## 2014-06-26 ENCOUNTER — Emergency Department (HOSPITAL_COMMUNITY)
Admission: EM | Admit: 2014-06-26 | Discharge: 2014-06-26 | Disposition: A | Discharge status: Home / Self Care | Payer: Medicaid Other | Attending: Emergency Medicine | Admitting: Emergency Medicine

## 2014-06-26 DIAGNOSIS — Z7982 Long term (current) use of aspirin: Secondary | ICD-10-CM | POA: Diagnosis not present

## 2014-06-26 DIAGNOSIS — Y929 Unspecified place or not applicable: Secondary | ICD-10-CM | POA: Insufficient documentation

## 2014-06-26 DIAGNOSIS — Z79899 Other long term (current) drug therapy: Secondary | ICD-10-CM | POA: Diagnosis not present

## 2014-06-26 DIAGNOSIS — T6391XA Toxic effect of contact with unspecified venomous animal, accidental (unintentional), initial encounter: Secondary | ICD-10-CM | POA: Diagnosis present

## 2014-06-26 DIAGNOSIS — T63461A Toxic effect of venom of wasps, accidental (unintentional), initial encounter: Secondary | ICD-10-CM | POA: Insufficient documentation

## 2014-06-26 DIAGNOSIS — G40909 Epilepsy, unspecified, not intractable, without status epilepticus: Secondary | ICD-10-CM | POA: Diagnosis not present

## 2014-06-26 DIAGNOSIS — F17200 Nicotine dependence, unspecified, uncomplicated: Secondary | ICD-10-CM | POA: Diagnosis not present

## 2014-06-26 DIAGNOSIS — H5789 Other specified disorders of eye and adnexa: Secondary | ICD-10-CM | POA: Insufficient documentation

## 2014-06-26 DIAGNOSIS — R220 Localized swelling, mass and lump, head: Secondary | ICD-10-CM | POA: Diagnosis not present

## 2014-06-26 DIAGNOSIS — T63441A Toxic effect of venom of bees, accidental (unintentional), initial encounter: Secondary | ICD-10-CM

## 2014-06-26 DIAGNOSIS — Y9389 Activity, other specified: Secondary | ICD-10-CM | POA: Insufficient documentation

## 2014-06-26 DIAGNOSIS — M129 Arthropathy, unspecified: Secondary | ICD-10-CM | POA: Insufficient documentation

## 2014-06-26 DIAGNOSIS — F209 Schizophrenia, unspecified: Secondary | ICD-10-CM | POA: Insufficient documentation

## 2014-06-26 DIAGNOSIS — R221 Localized swelling, mass and lump, neck: Secondary | ICD-10-CM

## 2014-06-26 MED ORDER — METHYLPREDNISOLONE SODIUM SUCC 125 MG IJ SOLR
80.0000 mg | Freq: Once | INTRAMUSCULAR | Status: AC
  Administered 2014-06-26: 80 mg via INTRAMUSCULAR
  Filled 2014-06-26: qty 2

## 2014-06-26 MED ORDER — DIPHENHYDRAMINE HCL 50 MG/ML IJ SOLN
50.0000 mg | Freq: Once | INTRAMUSCULAR | Status: AC
  Administered 2014-06-26: 50 mg via INTRAMUSCULAR
  Filled 2014-06-26: qty 1

## 2014-06-26 MED ORDER — EPINEPHRINE 0.3 MG/0.3ML IJ SOAJ: 0.3000 mg | INTRAMUSCULAR | Status: DC | PRN

## 2014-06-26 MED ORDER — RANITIDINE HCL 150 MG PO TABS: 300.0000 mg | ORAL_TABLET | Freq: Two times a day (BID) | ORAL | Status: DC

## 2014-06-26 MED ORDER — PREDNISONE 20 MG PO TABS: ORAL_TABLET | ORAL | Status: DC

## 2014-06-26 MED ORDER — DIPHENHYDRAMINE HCL 25 MG PO TABS: 50.0000 mg | ORAL_TABLET | Freq: Four times a day (QID) | ORAL | Status: DC | PRN

## 2014-06-26 MED ORDER — FAMOTIDINE 20 MG PO TABS
20.0000 mg | ORAL_TABLET | Freq: Once | ORAL | Status: AC
  Administered 2014-06-26: 20 mg via ORAL
  Filled 2014-06-26: qty 1

## 2014-06-26 NOTE — ED Notes (Signed)
Initial contact-patient A&Ox4. Per patient-was stung last summer and came here for treatment. Lost consciousness with that insect bite. Does not currently have epi pen. Was stung yesterday around 1200 above the left eye. Periorbital swelling noted around entire left eye. Reports left eye drainage-unsure of drainage color. Reports "blurry vision" in left eye. No LOC with this insect bite. Held ice on bite last night. Emesis x1 last night-denies blood in vomit. Denies D, F, chills. In NAD. Awaiting MD.

## 2014-06-26 NOTE — ED Provider Notes (Deleted)
Medical screening examination/treatment/procedure(s) were performed by non-physician practitioner and as supervising physician I was immediately available for consultation/collaboration.  Flint MelterElliott L Anaja Monts, MD 06/26/14 [phone]

## 2014-06-26 NOTE — Discharge Instructions (Signed)
Take Prednisone, Benadryl, and Zantac as prescribed for the next 5 days. Continue your usual home medications. Get plenty of rest and drink plenty of fluids. Avoid any known triggers. Please followup with your primary doctor for discussion of your diagnoses and further evaluation after today's visit; if you do not have a primary care doctor use the resource guide provided to find one; followup with allergist to discuss further care of your allergies. I have given you a prescription for an EpiPen in case you are stung by a bee again. Use this as directed, only if you are stung and have trouble breathing. If you develop trouble breathing after being discharged today, call 911.    Anaphylactic Reaction An anaphylactic reaction is a sudden, severe allergic reaction that involves the whole body. It can be life threatening. A hospital stay is often required. People with asthma, eczema, or hay fever are slightly more likely to have an anaphylactic reaction. CAUSES  An anaphylactic reaction may be caused by anything to which you are allergic. After being exposed to the allergic substance, your immune system becomes sensitized to it. When you are exposed to that allergic substance again, an allergic reaction can occur. Common causes of an anaphylactic reaction include:  Medicines.  Foods, especially peanuts, wheat, shellfish, milk, and eggs.  Insect bites or stings.  Blood products.  Chemicals, such as dyes, latex, and contrast material used for imaging tests. SYMPTOMS  When an allergic reaction occurs, the body releases histamine and other substances. These substances cause symptoms such as tightening of the airway. Symptoms often develop within seconds or minutes of exposure. Symptoms may include:  Skin rash or hives.  Itching.  Chest tightness.  Swelling of the eyes, tongue, or lips.  Trouble breathing or swallowing.  Lightheadedness or fainting.  Anxiety or confusion.  Stomach pains,  vomiting, or diarrhea.  Nasal congestion.  A fast or irregular heartbeat (palpitations). DIAGNOSIS  Diagnosis is based on your history of recent exposure to allergic substances, your symptoms, and a physical exam. Your caregiver may also perform blood or urine tests to confirm the diagnosis. TREATMENT  Epinephrine medicine is the main treatment for an anaphylactic reaction. Other medicines that may be used for treatment include antihistamines, steroids, and albuterol. In severe cases, fluids and medicine to support blood pressure may be given through an intravenous line (IV). Even if you improve after treatment, you need to be observed to make sure your condition does not get worse. This may require a stay in the hospital. [place] a medical alert bracelet or necklace stating your allergy.  You and your family must learn how to use an anaphylaxis kit or give an epinephrine injection to temporarily treat an emergency allergic reaction. Always carry your epinephrine injection or anaphylaxis kit with you. This can be lifesaving if you have a severe reaction.  Do not drive or perform tasks after treatment until the medicines used to treat your reaction have worn off, or until your caregiver says it is okay.  If you have hives or a rash:  Take medicines as directed by your caregiver.  You may use an over-the-counter antihistamine (diphenhydramine) as needed.  Apply cold compresses to the skin or take baths in cool water. Avoid hot baths or showers. SEEK MEDICAL CARE IF:   You develop symptoms of an allergic reaction to a new substance. Symptoms may start right away or minutes later.  You develop a rash, hives, or itching.  You develop  new symptoms. SEEK IMMEDIATE MEDICAL CARE IF:   You have swelling of the mouth, difficulty breathing, or wheezing.  You have a tight feeling in your chest or throat.  You develop hives, swelling, or itching all over your body.  You  develop severe vomiting or diarrhea.  You feel faint or pass out. This is an emergency. Use your epinephrine injection or anaphylaxis kit as you have been instructed. Call your local emergency services (911 in [place] Even if you improve after the injection, you need to be examined at a hospital emergency department. MAKE SURE YOU:   Understand these instructions.  Will watch your condition.  Will get help right away if you are not doing well or get worse. Document Released: 12/11/2005 Document Revised: 12/16/2013 Document Reviewed: 03/13/2012 [facility] Patient Information 2015 [place], [place]. This information is not intended to replace advice given to you by your health care provider. Make sure you discuss any questions you have with your health care provider.  Angioedema Angioedema is sudden puffiness (swelling), often of the skin. It can happen:  On your face or privates (genitals).  In your belly (abdomen) or other body parts. It usually happens quickly and gets better in 1 or 2 days. It often starts at night and is found when you wake up. You may get red, itchy patches of skin (hives). Attacks can be dangerous if your breathing passages get puffy. The condition may happen only once, or it can come back at random times. It may happen for several years before it goes away for good. HOME CARE  Only take medicines as told by your doctor.  Always carry your emergency allergy medicines with you.  Wear a medical bracelet as told by your doctor.  Avoid things that you know will cause attacks (triggers). GET HELP IF:  You have another attack.  Your attacks happen more often or get worse.  The condition was passed to you by your parents and you want to have children. GET HELP RIGHT AWAY IF:   Your mouth, tongue, or lips are very puffy.  You have trouble breathing.  You have trouble swallowing.  You pass out (faint). MAKE SURE YOU:   Understand these instructions.  Will  watch your condition.  Will get help right away if you are not doing well or get worse. Document Released: 11/29/2009 Document Revised: 10/01/2013 Document Reviewed: 08/04/2013 [facility] Patient Information 2015 [place], [place]. This information is not intended to replace advice given to you by your health care provider. Make sure you discuss any questions you have with your health care provider.

## 2014-06-26 NOTE — ED Notes (Signed)
Pt educated with epi pen trainer. Emphasized importance of epi pen and having it on hand at all times. AVS explained in detail. Pt states "I feel like the swelling has gone down some." Speaking, full clear sentences. Ambulatory with steady gait.

## 2014-06-26 NOTE — ED Provider Notes (Signed)
CSN: [id number]     Arrival date & time 06/26/14  1134 History   First MD Initiated Contact with Patient 06/26/14 1203     Chief Complaint  Patient presents with  . Insect Bite   The history is provided by the patient. No language interpreter was used.   This chart was scribed for non-physician practitioner working with Flint Melter, MD, by Andrew Au, ED Scribe. This patient was seen in room WTR8/WTR8 and the patient's care was started at 12:12 PM.  [name] is a 38 y.o. male who presents to the Emergency Department complaining of a bee sting above left eye x 1 day ago with associated swelling and watery eye drainage. Pt rinsed eye with water after sting and applied ice last night. Pt reports 1 episode of emesis but was feeling bad throughout the day. Pt denies pain with eye movement and locates pain to skin. Pt denies taking OTC medication. Pt denies SOB, trouble breathing, CP, lip or tongue swelling. Pt denies numbness and tingling in hands and feet. Pt has had bee sting in the past and does not have an epipen. He has not seen and allergist for this problem.   Past Medical History  Diagnosis Date  . Substance abuse     has not used for 5-6 yrs  . Avascular necrosis of hip     left  . Schizophrenia   . Seizures     HX [place] seiaures - last seizure 6-7 months ago  . Avascular necrosis     L HIP  . Arthritis     hands   Past Surgical History  Procedure Laterality Date  . No past surgeries    . 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 Cancer Brother   . Diabetes Mother   . Hypertension Mother   . Cancer Father   . Hypertension Father   . Hypertension Sister   . Diabetes Maternal Grandmother    History  Substance Use Topics  . Smoking status: Current Every Day Smoker --  5.00 packs/day for 5 years    Types: Cigarettes  . Smokeless tobacco: Never Used  . Alcohol Use: 0.0 oz/week     Comment: PT STATES 4 cans beer daily -HIS SISTER SAYS HE HAS ALCOHOLISM-SHE IS CONCERNED ABOUT WITHDRAL AFTER SURGERY    Review of Systems  Constitutional: Negative for fever.  HENT: Positive for facial swelling.   Eyes: Positive for discharge (watery). Negative for photophobia, pain, redness, itching and visual disturbance.  Respiratory: Negative for apnea, cough, choking, chest tightness, shortness of breath, wheezing and stridor.   Cardiovascular: Negative for chest pain.  Gastrointestinal: Positive for vomiting (once last night).  Musculoskeletal: Negative for neck pain and neck stiffness.  Skin: Negative for color change and rash.  Neurological: Negative for dizziness, weakness and numbness.   Allergies  Bee venom  Home Medications   Prior to Admission medications   Medication Sig Start Date End Date Taking? Authorizing Provider  aspirin EC 325 MG EC tablet Take 1 tablet (325 mg total) by mouth 2 (two) times daily. 05/01/13  Yes Genelle Gather Babish, PA-C  benztropine (COGENTIN) 1 MG tablet Take 1 mg by mouth 2 (two) times daily.   Yes Historical Provider, MD  docusate sodium 100 MG CAPS Take  100 mg by mouth 2 (two) times daily. 05/01/13  Yes Genelle GatherMatthew Scott Babish, PA-C  ferrous sulfate 325 (65 FE) MG tablet Take 1 tablet (325 mg total) by mouth 3 (three) times daily after meals. 05/01/13  Yes Genelle GatherMatthew Scott Babish, PA-C  HYDROcodone-acetaminophen (NORCO) 7.5-325 MG per tablet Take 1-2 tablets by mouth every 4 (four) hours as needed for pain. 05/01/13  Yes Genelle GatherMatthew Scott Babish, PA-C  phenytoin (DILANTIN) 100 MG ER capsule Take 300 mg by mouth at bedtime. TAKES ONE IN THE AM AND 2 IN THE PM   Yes Historical Provider, MD  polyethylene glycol (MIRALAX / GLYCOLAX) packet Take 17 g by mouth 2 (two) times daily. 05/01/13  Yes Genelle GatherMatthew Scott Babish, PA-C  risperiDONE (RISPERDAL) 3 MG  tablet Take 3 mg by mouth 2 (two) times daily.   Yes Historical Provider, MD  sertraline (ZOLOFT) 100 MG tablet Take 100 mg by mouth every morning.   Yes Historical Provider, MD  traZODone (DESYREL) 100 MG tablet Take 200 mg by mouth at bedtime.    Yes Historical Provider, MD  diphenhydrAMINE (BENADRYL) 25 MG tablet Take 2 tablets (50 mg total) by mouth every 6 (six) hours as needed for allergies (and swelling). 06/26/14   Yesmin Mutch Strupp Camprubi-Soms, PA-C  EPINEPHrine (EPIPEN) 0.3 mg/0.3 mL IJ SOAJ injection Inject 0.3 mLs (0.3 mg total) into the muscle as needed. 06/26/14   Shaughn Thomley Strupp Camprubi-Soms, PA-C  predniSONE (DELTASONE) 20 MG tablet 3 tabs po daily x 5 days 06/26/14   Donnita FallsMercedes Strupp Camprubi-Soms, PA-C  ranitidine (ZANTAC) 150 MG tablet Take 2 tablets (300 mg total) by mouth 2 (two) times daily. 06/26/14   Mahmood Boehringer Strupp Camprubi-Soms, PA-C   BP 111/88  Pulse 83  Temp(Src) 99.2 F (37.3 C) (Oral)  Resp 16  SpO2 98% Physical Exam  Nursing note and vitals reviewed. Constitutional: He is oriented to person, place, and time. Vital signs are normal. He appears well-developed and well-nourished. No distress.  Sitting up in chair, in NAD  HENT:  Head: Normocephalic and atraumatic.  Right Ear: External ear normal.  Left Ear: External ear normal.  Nose: Nose normal. No mucosal edema or rhinorrhea.  Mouth/Throat: Uvula is midline, oropharynx is clear and moist and mucous membranes are normal.  Face with b/l periorbital swelling, mildly erythematous diffusely throughout, swelling stops at nasal bridge. Non-TTP. Oropharynx clear, MMM, no tongue or lip swelling, no angioedema noted.   Eyes: Conjunctivae and EOM are normal. Pupils are equal, round, and reactive to light. Right eye exhibits discharge (watery). Right eye exhibits no chemosis. Left eye exhibits discharge (watery). Left eye exhibits no chemosis.  EOMI, no pain with EOM. Conjunctiva WNL, non-injected. Watery d/c noted, pt rubbing  his eyes, denies itching. PERRL.   Neck: Normal range of motion. Neck supple. No rigidity. Normal range of motion present.  Cardiovascular: Normal rate, regular rhythm, normal heart sounds and intact distal pulses.  Exam reveals no friction rub.   No murmur heard. Pulmonary/Chest: Effort normal and breath sounds normal. He has no wheezes. He has no rhonchi. He has no rales.  Abdominal: Normal appearance. He exhibits no distension.  Musculoskeletal: Normal range of motion.  Lymphadenopathy:    He has no cervical adenopathy.  Neurological: He is alert and oriented to person, place, and time. He has normal strength. No sensory deficit. GCS eye subscore is 4. GCS verbal subscore is 5. GCS motor subscore is 6.  Neurovascularly intact, strength 5/5 in all extremities. A&Ox4.  Skin: Skin is warm,  dry and intact. No rash noted. There is erythema.  Periorbital edema and mild erythema as noted above  Psychiatric: He has a normal mood and affect.    ED Course  Procedures (including critical care time) Labs Review Labs Reviewed - No data to display  Imaging Review No results found.   EKG Interpretation None      MDM   Final diagnoses:  Bee sting reaction, accidental or unintentional, initial encounter  Facial swelling   Jenny Reichmannhillip Mcginniss is a 38 y.o. male with a PMHx of anaphylaxis to bee stings, presenting here today 1 day s/p bee sting with facial swelling and watery eye d/c. Denies SOB or stridor, oxygenating well on RA, no angioedema noted. Afebrile, non-toxic appearing. Low clinical suspicion for periorbital cellulitis, no need for CT at this time. Given Solumedrol, Benadryl, and Pepcid in ED with mild improvement prior to d/c. Visual acuity 20/25 bilaterally, 20/30 in L, 20/40 in R. This is unlikely to represent acute change, likely chronic issue. Will rx benadryl, prednisone, and zantac x 5 days. Discussed f/up with allergist for further testing. Given hx of anaphylaxis rxn to bees, will rx  epipen at this time. I explained the diagnosis and have given explicit precautions to return to the ER including for any other new or worsening symptoms. The patient understands and accepts the medical plan as it's been dictated and I have answered their questions. Discharge instructions concerning home care and prescriptions have been given. The patient is STABLE and is discharged to home in good condition.  I personally performed the services described in this documentation, which was scribed in my presence. The recorded information has been reviewed and is accurate.  BP 127/81  Pulse 62  Temp(Src) 98.6 F (37 C) (Oral)  Resp 17  SpO2 100%       Donnita FallsMercedes Strupp Camprubi-Soms, PA-C 06/26/14 1333

## 2014-06-26 NOTE — ED Provider Notes (Signed)
  Face-to-face evaluation   History: He was stung by a bee yesterday, on his left forehead. He is having trouble seeing, left eye because of the swelling. He denies shortness of breath, nausea, vomiting, weakness, or dizziness.   Physical exam: Alert, cooperative. Face- mild, left forehead and left periorbital angioedema. Minimal right periorbital angioedema. No conjunctival injection or chymosis. Mouth without angioedema. Respiratory-no distress  Medical screening examination/treatment/procedure(s) were conducted as a shared visit with non-physician practitioner(s) and myself.  I personally evaluated the patient during the encounter  Flint MelterElliott L Ayani Ospina, MD 07/05/14 1427

## 2014-12-14 ENCOUNTER — Emergency Department (HOSPITAL_COMMUNITY): Payer: Medicaid Other

## 2014-12-14 ENCOUNTER — Emergency Department (HOSPITAL_COMMUNITY)
Admission: EM | Admit: 2014-12-14 | Discharge: 2014-12-14 | Disposition: A | Discharge status: Home / Self Care | Payer: Medicaid Other | Attending: Emergency Medicine | Admitting: Emergency Medicine

## 2014-12-14 ENCOUNTER — Encounter (HOSPITAL_COMMUNITY): Payer: Self-pay | Admitting: *Deleted

## 2014-12-14 DIAGNOSIS — F209 Schizophrenia, unspecified: Secondary | ICD-10-CM | POA: Insufficient documentation

## 2014-12-14 DIAGNOSIS — Z720 Tobacco use: Secondary | ICD-10-CM | POA: Insufficient documentation

## 2014-12-14 DIAGNOSIS — M19041 Primary osteoarthritis, right hand: Secondary | ICD-10-CM | POA: Insufficient documentation

## 2014-12-14 DIAGNOSIS — S299XXA Unspecified injury of thorax, initial encounter: Secondary | ICD-10-CM | POA: Insufficient documentation

## 2014-12-14 DIAGNOSIS — Z7952 Long term (current) use of systemic steroids: Secondary | ICD-10-CM | POA: Diagnosis not present

## 2014-12-14 DIAGNOSIS — Z79899 Other long term (current) drug therapy: Secondary | ICD-10-CM | POA: Insufficient documentation

## 2014-12-14 DIAGNOSIS — G40909 Epilepsy, unspecified, not intractable, without status epilepticus: Secondary | ICD-10-CM | POA: Insufficient documentation

## 2014-12-14 DIAGNOSIS — S29001A Unspecified injury of muscle and tendon of front wall of thorax, initial encounter: Secondary | ICD-10-CM | POA: Diagnosis not present

## 2014-12-14 DIAGNOSIS — Y998 Other external cause status: Secondary | ICD-10-CM | POA: Insufficient documentation

## 2014-12-14 DIAGNOSIS — M19042 Primary osteoarthritis, left hand: Secondary | ICD-10-CM | POA: Insufficient documentation

## 2014-12-14 DIAGNOSIS — Y9389 Activity, other specified: Secondary | ICD-10-CM | POA: Diagnosis not present

## 2014-12-14 DIAGNOSIS — R0781 Pleurodynia: Secondary | ICD-10-CM

## 2014-12-14 DIAGNOSIS — Y92410 Unspecified street and highway as the place of occurrence of the external cause: Secondary | ICD-10-CM | POA: Diagnosis not present

## 2014-12-14 MED ORDER — TRAMADOL HCL 50 MG PO TABS: 50.0000 mg | ORAL_TABLET | Freq: Four times a day (QID) | ORAL | Status: DC | PRN
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
## 2014-12-14 NOTE — Discharge Instructions (Signed)
Take Tramadol as needed for pain. Refer to attached documents for more information.  °
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
## 2015-05-31 ENCOUNTER — Encounter (HOSPITAL_COMMUNITY): Payer: Self-pay | Admitting: Emergency Medicine

## 2015-05-31 ENCOUNTER — Emergency Department (HOSPITAL_COMMUNITY)
Admission: EM | Admit: 2015-05-31 | Discharge: 2015-05-31 | Disposition: A | Discharge status: Home / Self Care | Payer: Medicaid Other | Attending: Emergency Medicine | Admitting: Emergency Medicine

## 2015-05-31 ENCOUNTER — Other Ambulatory Visit: Payer: Self-pay

## 2015-05-31 DIAGNOSIS — F1010 Alcohol abuse, uncomplicated: Secondary | ICD-10-CM | POA: Insufficient documentation

## 2015-05-31 DIAGNOSIS — M1990 Unspecified osteoarthritis, unspecified site: Secondary | ICD-10-CM | POA: Insufficient documentation

## 2015-05-31 DIAGNOSIS — Z9119 Patient's noncompliance with other medical treatment and regimen: Secondary | ICD-10-CM | POA: Diagnosis not present

## 2015-05-31 DIAGNOSIS — Z79899 Other long term (current) drug therapy: Secondary | ICD-10-CM | POA: Insufficient documentation

## 2015-05-31 DIAGNOSIS — Z9114 Patient's other noncompliance with medication regimen: Secondary | ICD-10-CM

## 2015-05-31 DIAGNOSIS — R569 Unspecified convulsions: Secondary | ICD-10-CM | POA: Diagnosis present

## 2015-05-31 DIAGNOSIS — Z7952 Long term (current) use of systemic steroids: Secondary | ICD-10-CM | POA: Diagnosis not present

## 2015-05-31 DIAGNOSIS — Z7982 Long term (current) use of aspirin: Secondary | ICD-10-CM | POA: Insufficient documentation

## 2015-05-31 DIAGNOSIS — Z720 Tobacco use: Secondary | ICD-10-CM | POA: Insufficient documentation

## 2015-05-31 DIAGNOSIS — F209 Schizophrenia, unspecified: Secondary | ICD-10-CM | POA: Insufficient documentation

## 2015-05-31 DIAGNOSIS — G40909 Epilepsy, unspecified, not intractable, without status epilepticus: Secondary | ICD-10-CM | POA: Insufficient documentation

## 2015-05-31 LAB — CBG MONITORING, ED: GLUCOSE-CAPILLARY: 124 mg/dL — AB (ref 65–99)

## 2015-05-31 LAB — PHENYTOIN LEVEL, TOTAL: Phenytoin Lvl: <2.5 ug/mL — ABNORMAL LOW (ref 10.0–20.0)

## 2015-05-31 MED ORDER — SODIUM CHLORIDE 0.9 % IV SOLN
1000.0000 mg | Freq: Once | INTRAVENOUS | Status: AC
  Administered 2015-05-31: 1000 mg via INTRAVENOUS
  Filled 2015-05-31: qty 20

## 2015-05-31 MED ORDER — DIPHENHYDRAMINE HCL 50 MG/ML IJ SOLN
INTRAMUSCULAR | Status: AC
  Filled 2015-05-31: qty 1

## 2015-05-31 MED ORDER — LORAZEPAM 2 MG/ML IJ SOLN
1.0000 mg | Freq: Once | INTRAMUSCULAR | Status: AC
  Administered 2015-05-31: 1 mg via INTRAVENOUS
  Filled 2015-05-31: qty 1

## 2015-05-31 MED ORDER — DIPHENHYDRAMINE HCL 50 MG/ML IJ SOLN
25.0000 mg | Freq: Once | INTRAMUSCULAR | Status: AC
  Administered 2015-05-31: 25 mg via INTRAVENOUS

## 2015-05-31 NOTE — ED Provider Notes (Signed)
CSN: [id number]     Arrival date & time 05/31/15  1212 History   First MD Initiated Contact with Patient 05/31/15 1237     Chief Complaint  Patient presents with  . Seizures     (Consider location/radiation/quality/duration/timing/severity/associated sxs/prior Treatment) HPI Comments: Patient with history of alcohol use and seizure disorder on Dilantin presents with complaint of seizure today. Patient is unable to give details. EMS reports that they were told the seizure lasted for approximately 2 minutes, patient was lying on a bed when it happened. Bystanders called EMS. Patient admits to not taking his Dilantin regularly. He states that he was drinking 2 days ago but has not drank in the past 2 days. He is unclear about past withdrawal sx. Patient denies biting his tongue. He states that he feels fatigued at the current time but otherwise has no complaints. No headache, neck pain, chest pain, shortness of breath. The onset of this condition was acute. The course is resolved. Aggravating factors: none. Alleviating factors: none.    Patient is a 39 y.o. male presenting with seizures. The history is provided by the patient, medical records and a relative.  Seizures   Past Medical History  Diagnosis Date  . Substance abuse     has not used for 5-6 yrs  . Avascular necrosis of hip     left  . Schizophrenia   . Seizures     HX [place] seiaures - last seizure 6-7 months ago  . Avascular necrosis     L HIP  . Arthritis     hands   Past Surgical History  Procedure Laterality Date  . No past surgeries    . Total hip arthroplasty Left 04/29/2013    Procedure: TOTAL HIP ARTHROPLASTY ANTERIOR APPROACH;  Surgeon: Shelda PalMatthew D Olin, MD;  Location: [facility];  Service: Orthopedics;  Laterality: Left;   Family History  Problem Relation Age of Onset  . Cancer Brother   . Diabetes Mother   . Hypertension Mother   . Cancer Father   . Hypertension Father   . Hypertension Sister   . Diabetes  Maternal Grandmother    History  Substance Use Topics  . Smoking status: Current Every Day Smoker -- 5.00 packs/day for 5 years    Types: Cigarettes  . Smokeless tobacco: Never Used  . Alcohol Use: 0.0 oz/week     Comment: PT STATES 4 cans beer daily -HIS SISTER SAYS HE HAS ALCOHOLISM-SHE IS CONCERNED ABOUT WITHDRAL AFTER SURGERY    Review of Systems  Constitutional: Positive for fatigue.  HENT: Negative for tinnitus.   Eyes: Negative for photophobia, pain and visual disturbance.  Respiratory: Negative for shortness of breath.   Cardiovascular: Negative for chest pain.  Gastrointestinal: Negative for nausea and vomiting.  Musculoskeletal: Negative for back pain, gait problem and neck pain.  Skin: Negative for wound.  Neurological: Positive for seizures. Negative for dizziness, weakness, light-headedness, numbness and headaches.  Psychiatric/Behavioral: Negative for confusion and decreased concentration.      Allergies  Bee venom  Home Medications   Prior to Admission medications   Medication Sig Start Date End Date Taking? Authorizing Provider  aspirin EC 325 MG EC tablet Take 1 tablet (325 mg total) by mouth 2 (two) times daily. 05/01/13   Lanney GinsMatthew Babish, PA-C  benztropine (COGENTIN) 1 MG tablet Take 1 mg by mouth 2 (two) times daily.    Historical Provider, MD  diphenhydrAMINE (BENADRYL) 25 MG tablet Take 2 tablets (50 mg total) by  mouth every 6 (six) hours as needed for allergies (and swelling). 06/26/14   Mercedes Camprubi-Soms, PA-C  docusate sodium 100 MG CAPS Take 100 mg by mouth 2 (two) times daily. 05/01/13   Lanney Gins, PA-C  EPINEPHrine (EPIPEN) 0.3 mg/0.3 mL IJ SOAJ injection Inject 0.3 mLs (0.3 mg total) into the muscle as needed. 06/26/14   Mercedes Camprubi-Soms, PA-C  ferrous sulfate 325 (65 FE) MG tablet Take 1 tablet (325 mg total) by mouth 3 (three) times daily after meals. 05/01/13   Lanney Gins, PA-C  HYDROcodone-acetaminophen (NORCO) 7.5-325 MG per tablet  Take 1-2 tablets by mouth every 4 (four) hours as needed for pain. 05/01/13   Lanney Gins, PA-C  phenytoin (DILANTIN) 100 MG ER capsule Take 300 mg by mouth at bedtime. TAKES ONE IN THE AM AND 2 IN THE PM    Historical Provider, MD  polyethylene glycol (MIRALAX / GLYCOLAX) packet Take 17 g by mouth 2 (two) times daily. 05/01/13   Lanney Gins, PA-C  predniSONE (DELTASONE) 20 MG tablet 3 tabs po daily x 5 days 06/26/14   Mercedes Camprubi-Soms, PA-C  ranitidine (ZANTAC) 150 MG tablet Take 2 tablets (300 mg total) by mouth 2 (two) times daily. 06/26/14   Mercedes Camprubi-Soms, PA-C  risperiDONE (RISPERDAL) 3 MG tablet Take 3 mg by mouth 2 (two) times daily.    Historical Provider, MD  sertraline (ZOLOFT) 100 MG tablet Take 100 mg by mouth every morning.    Historical Provider, MD  traMADol (ULTRAM) 50 MG tablet Take 1 tablet (50 mg total) by mouth every 6 (six) hours as needed. 12/14/14   Kaitlyn Szekalski, PA-C  traZODone (DESYREL) 100 MG tablet Take 200 mg by mouth at bedtime.     Historical Provider, MD   BP 125/80 mmHg  Pulse 103  Temp(Src) 98.7 F (37.1 C) (Oral)  Resp 16  SpO2 98% Physical Exam  Constitutional: He is oriented to person, place, and time. He appears well-developed and well-nourished.  HENT:  Head: Normocephalic and atraumatic.  Right Ear: Tympanic membrane, external ear and ear canal normal.  Left Ear: Tympanic membrane, external ear and ear canal normal.  Nose: Nose normal.  Mouth/Throat: Uvula is midline, oropharynx is clear and moist and mucous membranes are normal.  Eyes: Conjunctivae, EOM and lids are normal. Pupils are equal, round, and reactive to light.  Neck: Normal range of motion. Neck supple.  Cardiovascular: Normal rate and regular rhythm.   Pulmonary/Chest: Effort normal and breath sounds normal.  Abdominal: Soft. There is no tenderness.  Musculoskeletal: Normal range of motion.       Cervical back: He exhibits normal range of motion, no tenderness and no  bony tenderness.  Neurological: He is alert and oriented to person, place, and time. He has normal strength and normal reflexes. He displays tremor. No cranial nerve deficit or sensory deficit. He exhibits normal muscle tone. He displays a negative Romberg sign. Coordination normal. GCS eye subscore is 4. GCS verbal subscore is 5. GCS motor subscore is 6.  Skin: Skin is warm and dry.  Psychiatric: He has a normal mood and affect.  Nursing note and vitals reviewed.   ED Course  Procedures (including critical care time) Labs Review Labs Reviewed  PHENYTOIN LEVEL, TOTAL - Abnormal; Notable for the following:    Phenytoin Lvl <2.5 (*)    All other components within normal limits  CBG MONITORING, ED - Abnormal; Notable for the following:    Glucose-Capillary 124 (*)    All other  components within normal limits  CBG MONITORING, ED    Imaging Review No results found.   EKG Interpretation   Date/Time:  Monday May 31 2015 12:20:30 EDT Ventricular Rate:  103 PR Interval:  148 QRS Duration: 83 QT Interval:  355 QTC Calculation: 465 R Axis:   68 Text Interpretation:  Sinus tachycardia Otherwise normal ECG no  significant change since 2005 Confirmed by GOLDSTON  MD, SCOTT ([id number]) on  05/31/2015 4:00:07 PM       1:00 PM Patient seen and examined. Work-up initiated. Medications ordered. Will give ativan to treat alcohol withdrawal vs epileptic seizure. Pt back at baseline. Dilantin level ordered, suspect this will be subtherapeutic.   Vital signs reviewed and are as follows: BP 125/80 mmHg  Pulse 103  Temp(Src) 98.7 F (37.1 C) (Oral)  Resp 16  SpO2 98%  3:55 PM Pt updated on results. Will give dilantin load here. Assuming no further  Seizure activity, patient can be discharged home.   4:06 PM Handoff to Upstill PA-C at shift change. He can be d/c when safe.   MDM   Final diagnoses:  Seizure  Alcohol abuse  Noncompliance with medications   Seizure 2/2 seizure disorder vs  alcohol withdrawal. Ativan given in ED. Work-up unrevealing. Pt has not been taking dilantin. He was loaded in ED. Pending completion of treatment.     Renne Crigler, PA-C 05/31/15 1607  Pricilla Loveless, MD 06/05/15 [phone]

## 2015-05-31 NOTE — ED Notes (Signed)
Bed: ZO10WA06 Expected date: 05/31/15 Expected time:  Means of arrival:  Comments: EMS seizure

## 2015-05-31 NOTE — ED Notes (Signed)
[phone] Duane AmassGwendolyn Garcia sister reports she is POA

## 2015-05-31 NOTE — Discharge Instructions (Signed)
Please read and follow all provided instructions.  Your diagnoses today include:  1. Seizure   2. Alcohol abuse   3. Noncompliance with medications     Tests performed today include:  Electrolytes - normal  Dilantin level - very low  Vital signs. See below for your results today.   Medications prescribed:   None  Take any prescribed medications only as directed.  Home care instructions:  Follow any educational materials contained in this packet.  BE VERY CAREFUL not to take multiple medicines containing Tylenol (also called acetaminophen). Doing so can lead to an overdose which can damage your liver and cause liver failure and possibly death.   Follow-up instructions: Please follow-up with your primary care provider in the next 3 days for further evaluation of your symptoms.   Return instructions:   Please return to the Emergency Department if you experience worsening symptoms.   Please return if you have any other emergent concerns.  Additional Information:  Your vital signs today were: BP 121/81 mmHg   Pulse 92   Temp(Src) 98.7 F (37.1 C) (Oral)   Resp 17   SpO2 98% If your blood pressure (BP) was elevated above 135/85 this visit, please have this repeated by your doctor within one month. --------------

## 2015-05-31 NOTE — ED Notes (Signed)
CBG 126  

## 2015-05-31 NOTE — ED Notes (Signed)
Per EMS- (hx seizures) had 2 minute seizure today, witnessed, on the bed. Emesis prior to seizure. Takes Dilantin-has not been consistently taking medications. Unable to report the current year-otherwise at baseline. Denies pain. VS: 135/89 HR 108 RR 16. SpO2 100% on RA. CBG 127 mg/dl. 18 G PIV in LAC.

## 2015-05-31 NOTE — ED Notes (Addendum)
Spoke to patients niece Granville LewisShashona Wilson per pt request via cell phone whom reports patient is illiterate has Hx seizures, medications prescribed but patient has not been taking d/t use of alcohol. She also reports hx schizophrenia, bipolar disorder

## 2015-08-06 ENCOUNTER — Encounter (HOSPITAL_COMMUNITY): Payer: Self-pay | Admitting: *Deleted

## 2015-08-06 ENCOUNTER — Emergency Department (HOSPITAL_COMMUNITY)
Admission: EM | Admit: 2015-08-06 | Discharge: 2015-08-06 | Disposition: A | Discharge status: Home / Self Care | Payer: Medicaid Other | Attending: Emergency Medicine | Admitting: Emergency Medicine

## 2015-08-06 ENCOUNTER — Emergency Department (HOSPITAL_COMMUNITY): Payer: Medicaid Other

## 2015-08-06 DIAGNOSIS — R000 Tachycardia, unspecified: Secondary | ICD-10-CM | POA: Insufficient documentation

## 2015-08-06 DIAGNOSIS — R569 Unspecified convulsions: Secondary | ICD-10-CM | POA: Diagnosis present

## 2015-08-06 DIAGNOSIS — R740 Nonspecific elevation of levels of transaminase and lactic acid dehydrogenase [LDH]: Secondary | ICD-10-CM | POA: Insufficient documentation

## 2015-08-06 DIAGNOSIS — Z7982 Long term (current) use of aspirin: Secondary | ICD-10-CM | POA: Diagnosis not present

## 2015-08-06 DIAGNOSIS — T17908A Unspecified foreign body in respiratory tract, part unspecified causing other injury, initial encounter: Secondary | ICD-10-CM

## 2015-08-06 DIAGNOSIS — R7401 Elevation of levels of liver transaminase levels: Secondary | ICD-10-CM

## 2015-08-06 DIAGNOSIS — G40409 Other generalized epilepsy and epileptic syndromes, not intractable, without status epilepticus: Secondary | ICD-10-CM | POA: Insufficient documentation

## 2015-08-06 DIAGNOSIS — Z720 Tobacco use: Secondary | ICD-10-CM | POA: Insufficient documentation

## 2015-08-06 DIAGNOSIS — F209 Schizophrenia, unspecified: Secondary | ICD-10-CM | POA: Insufficient documentation

## 2015-08-06 DIAGNOSIS — M1990 Unspecified osteoarthritis, unspecified site: Secondary | ICD-10-CM | POA: Insufficient documentation

## 2015-08-06 DIAGNOSIS — F1010 Alcohol abuse, uncomplicated: Secondary | ICD-10-CM | POA: Insufficient documentation

## 2015-08-06 LAB — COMPREHENSIVE METABOLIC PANEL
ALBUMIN: 4.2 g/dL (ref 3.5–5.0)
ALT: 117 U/L — ABNORMAL HIGH (ref 17–63)
ANION GAP: 24 — AB (ref 5–15)
AST: 286 U/L — AB (ref 15–41)
Alkaline Phosphatase: 62 U/L (ref 38–126)
BUN: 7 mg/dL (ref 6–20)
CO2: 13 mmol/L — ABNORMAL LOW (ref 22–32)
CREATININE: 0.95 mg/dL (ref 0.61–1.24)
Calcium: 8.6 mg/dL — ABNORMAL LOW (ref 8.9–10.3)
Chloride: 101 mmol/L (ref 101–111)
GFR calc Af Amer: >60 mL/min (ref >60)
GFR calc non Af Amer: >60 mL/min (ref >60)
Glucose, Bld: 123 mg/dL — ABNORMAL HIGH (ref 65–99)
Potassium: 3.8 mmol/L (ref 3.5–5.1)
Sodium: 138 mmol/L (ref 135–145)
Total Bilirubin: 0.7 mg/dL (ref 0.3–1.2)
Total Protein: 7.4 g/dL (ref 6.5–8.1)

## 2015-08-06 LAB — CBC WITH DIFFERENTIAL/PLATELET
Basophils Absolute: 0 10*3/uL (ref 0.0–0.1)
Basophils Relative: 0 % (ref 0–1)
Eosinophils Absolute: 0 10*3/uL (ref 0.0–0.7)
Eosinophils Relative: 0 % (ref 0–5)
HCT: 34.8 % — ABNORMAL LOW (ref 39.0–52.0)
HEMOGLOBIN: 11.6 g/dL — AB (ref 13.0–17.0)
LYMPHS ABS: 0.9 10*3/uL (ref 0.7–4.0)
LYMPHS PCT: 23 % (ref 12–46)
MCH: 32.3 pg (ref 26.0–34.0)
MCHC: 33.3 g/dL (ref 30.0–36.0)
MCV: 96.9 fL (ref 78.0–100.0)
MONOS PCT: 17 % — AB (ref 3–12)
Monocytes Absolute: 0.6 10*3/uL (ref 0.1–1.0)
Neutro Abs: 2.3 10*3/uL (ref 1.7–7.7)
Neutrophils Relative %: 60 % (ref 43–77)
PLATELETS: 83 10*3/uL — AB (ref 150–400)
RBC: 3.59 MIL/uL — AB (ref 4.22–5.81)
RDW: 13.3 % (ref 11.5–15.5)
WBC: 3.8 10*3/uL — AB (ref 4.0–10.5)

## 2015-08-06 LAB — BASIC METABOLIC PANEL
ANION GAP: 11 (ref 5–15)
BUN: 6 mg/dL (ref 6–20)
CO2: 24 mmol/L (ref 22–32)
CREATININE: 0.86 mg/dL (ref 0.61–1.24)
Calcium: 8.4 mg/dL — ABNORMAL LOW (ref 8.9–10.3)
Chloride: 105 mmol/L (ref 101–111)
GFR calc Af Amer: >60 mL/min (ref >60)
GFR calc non Af Amer: >60 mL/min (ref >60)
Glucose, Bld: 110 mg/dL — ABNORMAL HIGH (ref 65–99)
Potassium: 3.6 mmol/L (ref 3.5–5.1)
Sodium: 140 mmol/L (ref 135–145)

## 2015-08-06 LAB — RAPID URINE DRUG SCREEN, HOSP PERFORMED
AMPHETAMINES: NOT DETECTED
BENZODIAZEPINES: NOT DETECTED
Barbiturates: NOT DETECTED
COCAINE: NOT DETECTED
Opiates: NOT DETECTED
TETRAHYDROCANNABINOL: NOT DETECTED

## 2015-08-06 LAB — URINALYSIS, ROUTINE W REFLEX MICROSCOPIC
Glucose, UA: NEGATIVE mg/dL
Hgb urine dipstick: NEGATIVE
Ketones, ur: NEGATIVE mg/dL
Nitrite: NEGATIVE
PROTEIN: 100 mg/dL — AB
SPECIFIC GRAVITY, URINE: 1.025 (ref 1.005–1.030)
UROBILINOGEN UA: 1 mg/dL (ref 0.0–1.0)
pH: 8.5 — ABNORMAL HIGH (ref 5.0–8.0)

## 2015-08-06 LAB — URINE MICROSCOPIC-ADD ON

## 2015-08-06 LAB — PHENYTOIN LEVEL, TOTAL: Phenytoin Lvl: <2.5 ug/mL — ABNORMAL LOW (ref 10.0–20.0)

## 2015-08-06 LAB — ETHANOL: ALCOHOL ETHYL (B): 7 mg/dL — AB (ref <5)

## 2015-08-06 LAB — I-STAT CG4 LACTIC ACID, ED: Lactic Acid, Venous: 2.89 mmol/L (ref 0.5–2.0)

## 2015-08-06 LAB — CBG MONITORING, ED: GLUCOSE-CAPILLARY: 125 mg/dL — AB (ref 65–99)

## 2015-08-06 MED ORDER — LORAZEPAM 2 MG/ML IJ SOLN
1.0000 mg | Freq: Once | INTRAMUSCULAR | Status: AC
  Administered 2015-08-06: 1 mg via INTRAVENOUS

## 2015-08-06 MED ORDER — ONDANSETRON HCL 4 MG/2ML IJ SOLN
4.0000 mg | Freq: Once | INTRAMUSCULAR | Status: AC
  Administered 2015-08-06: 4 mg via INTRAVENOUS
  Filled 2015-08-06: qty 2

## 2015-08-06 MED ORDER — SODIUM CHLORIDE 0.9 % IV BOLUS (SEPSIS)
1000.0000 mL | Freq: Once | INTRAVENOUS | Status: AC
  Administered 2015-08-06: 1000 mL via INTRAVENOUS

## 2015-08-06 MED ORDER — PHENYTOIN SODIUM EXTENDED 100 MG PO CAPS: 300.0000 mg | ORAL_CAPSULE | Freq: Every day | ORAL | Status: DC

## 2015-08-06 MED ORDER — SODIUM CHLORIDE 0.9 % IV SOLN
1000.0000 mg | Freq: Once | INTRAVENOUS | Status: AC
  Administered 2015-08-06: 1000 mg via INTRAVENOUS
  Filled 2015-08-06: qty 20

## 2015-08-06 MED ORDER — LORAZEPAM 2 MG/ML IJ SOLN
INTRAMUSCULAR | Status: AC
  Administered 2015-08-06: 1 mg via INTRAVENOUS
  Filled 2015-08-06: qty 1

## 2015-08-06 NOTE — Discharge Instructions (Signed)
You have been evaluated for your seizure.  Please avoid alcohol and take your anti seizure medication as prescribed.  Your liver enzymes are elevated, have this recheck by your doctor.  Return to ER if your condition worsen or if you have other concerns.  Seizure, Adult A seizure is abnormal electrical activity in the brain. Seizures usually last from 30 seconds to 2 minutes. There are various types of seizures. Before a seizure, you may have a warning sensation (aura) that a seizure is about to occur. An aura may include the following symptoms:   Fear or anxiety.  Nausea.  Feeling like the room is spinning (vertigo).  Vision changes, such as seeing flashing lights or spots. Common symptoms during a seizure include:  A change in attention or behavior (altered mental status).  Convulsions with rhythmic jerking movements.  Drooling.  Rapid eye movements.  Grunting.  Loss of bladder and bowel control.  Bitter taste in the mouth.  Tongue biting. After a seizure, you may feel confused and sleepy. You may also have an injury resulting from convulsions during the seizure. HOME CARE INSTRUCTIONS   If you are given medicines, take them exactly as prescribed by your health care provider.  Keep all follow-up appointments as directed by your health care provider.  Do not swim or drive or engage in risky activity during which a seizure could cause further injury to you or others until your health care provider says it is OK.  Get adequate rest.  Teach friends and family what to do if you have a seizure. They should:  Lay you on the ground to prevent a fall.  Put a cushion under your head.  Loosen any tight clothing around your neck.  Turn you on your side. If vomiting occurs, this helps keep your airway clear.  Stay with you until you recover.  Know whether or not you need emergency care. SEEK IMMEDIATE MEDICAL CARE IF:  The seizure lasts longer than 5 minutes.  The  seizure is severe or you do not wake up immediately after the seizure.  You have an altered mental status after the seizure.  You are having more frequent or worsening seizures. Someone should drive you to the emergency department or call local emergency services (911 in [place] MAKE SURE YOU:  Understand these instructions.  Will watch your condition.  Will get help right away if you are not doing well or get worse. Document Released: 12/08/2000 Document Revised: 10/01/2013 Document Reviewed: 07/23/2013 [facility] Patient Information 2015 [place], [place]. This information is not intended to replace advice given to you by your health care provider. Make sure you discuss any questions you have with your health care provider.

## 2015-08-06 NOTE — ED Notes (Signed)
Bed: ZO10 Expected date:  Expected time:  Means of arrival:  Comments: HOLD TR 2

## 2015-08-06 NOTE — ED Provider Notes (Signed)
CSN: [id number]     Arrival date & time 08/06/15  1454 History   First MD Initiated Contact with Patient 08/06/15 1507     Chief Complaint  Patient presents with  . Seizures     (Consider location/radiation/quality/duration/timing/severity/associated sxs/prior Treatment) HPI   39 year old male with hx of seizure currently on Dilantin, schizophrenia, substance abuse brought here via EMS for evaluation of seizure.  Hx difficult as pt is in postictal.  Pt had a tonic-clonic witnessed seizure lasting approximately 1 min prior to arrival.  Last seizure was last month.  Pt did report he may have missed his dilantin yesterday.  Pt had another tonic-clonic seizure lasting 1-2 min while in ER.  He received ativan.  He is currently post-ictal. Level V caveat applies due to AMS.    Past Medical History  Diagnosis Date  . Substance abuse     has not used for 5-6 yrs  . Avascular necrosis of hip     left  . Schizophrenia   . Seizures     HX [place] seiaures - last seizure 6-7 months ago  . Avascular necrosis     L HIP  . Arthritis     hands   Past Surgical History  Procedure Laterality Date  . No past surgeries    . 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 Cancer Brother   . Diabetes Mother   . Hypertension Mother   . Cancer Father   . Hypertension Father   . Hypertension Sister   . Diabetes Maternal Grandmother    Social History  Substance Use Topics  . Smoking status: Current Every Day Smoker -- 5.00 packs/day for 5 years    Types: Cigarettes  . Smokeless tobacco: Never Used  . Alcohol Use: 0.0 oz/week     Comment: PT STATES 4 cans beer daily -HIS SISTER SAYS HE HAS ALCOHOLISM-SHE IS CONCERNED ABOUT WITHDRAL AFTER SURGERY    Review of Systems  Unable to perform ROS: Mental status  change      Allergies  Bee venom  Home Medications   Prior to Admission medications   Medication Sig Start Date End Date Taking? Authorizing Provider  aspirin EC 325 MG EC tablet Take 1 tablet (325 mg total) by mouth 2 (two) times daily. Patient not taking: Reported on 05/31/2015 05/01/13   Lanney Gins, PA-C  diphenhydrAMINE (BENADRYL) 25 MG tablet Take 2 tablets (50 mg total) by mouth every 6 (six) hours as needed for allergies (and swelling). Patient not taking: Reported on 05/31/2015 06/26/14   Mercedes Camprubi-Soms, PA-C  docusate sodium 100 MG CAPS Take 100 mg by mouth 2 (two) times daily. Patient not taking: Reported on 05/31/2015 05/01/13   Lanney Gins, PA-C  EPINEPHrine (EPIPEN) 0.3 mg/0.3 mL IJ SOAJ injection Inject 0.3 mLs (0.3 mg total) into the muscle as needed. 06/26/14   Mercedes Camprubi-Soms, PA-C  ferrous sulfate 325 (65 FE) MG tablet Take 1 tablet (325 mg total) by mouth 3 (three) times daily after meals. Patient not taking: Reported on 05/31/2015 05/01/13   Lanney Gins, PA-C  HYDROcodone-acetaminophen (NORCO) 7.5-325 MG per tablet Take 1-2 tablets by mouth every 4 (four) hours as needed for pain. Patient not taking: Reported on 05/31/2015 05/01/13   Lanney Gins, PA-C  phenytoin (DILANTIN) 100  MG ER capsule Take 300 mg by mouth at bedtime. TAKES ONE IN THE AM AND 2 IN THE PM    Historical Provider, MD  polyethylene glycol (MIRALAX / GLYCOLAX) packet Take 17 g by mouth 2 (two) times daily. Patient not taking: Reported on 05/31/2015 05/01/13   Lanney Gins, PA-C  predniSONE (DELTASONE) 20 MG tablet 3 tabs po daily x 5 days Patient not taking: Reported on 05/31/2015 06/26/14   Mercedes Camprubi-Soms, PA-C  ranitidine (ZANTAC) 150 MG tablet Take 2 tablets (300 mg total) by mouth 2 (two) times daily. Patient not taking: Reported on 05/31/2015 06/26/14   Mercedes Camprubi-Soms, PA-C  traMADol (ULTRAM) 50 MG tablet Take 1 tablet (50 mg total) by mouth every 6 (six) hours as needed. Patient not  taking: Reported on 05/31/2015 12/14/14   Kaitlyn Szekalski, PA-C   BP 132/86 mmHg  Pulse 64  Temp(Src) 98.6 F (37 C) (Oral)  Resp 10  SpO2 100% Physical Exam  Constitutional: He appears well-developed and well-nourished. No distress.  AAM currently having tonic-clonic seizure.  Pt laying lateral decubitus position.  Seizure pads in place.    HENT:  Head: Atraumatic.  Unable to assess oral cavity  Eyes: Conjunctivae are normal. Pupils are equal, round, and reactive to light.  Neck: Neck supple.  No nuchal rigidity  Cardiovascular:  Tachycardic, no M/R/G  Pulmonary/Chest:  Scattered rhonchi heard, decreased breath sounds thoughout  Abdominal: Soft. There is no tenderness.  Neurological:  Post-ictal  Skin: No rash noted.  Psychiatric: He has a normal mood and affect.  Nursing note and vitals reviewed.   ED Course  Procedures (including critical care time)  Hx of seizure on dilantin, here with 2 episodes of seizures, one while in ER.  Pt was placed in lateral decubitus position, ativan and IVF administered.  Work up initiated.   4:42 PM Pt is now a bit more alert.  He is resting comfortably.  Denies having any significant pain or discomfort.  He does admit he has not been taking his dilantin for 3 days for no specific reason except being forgetful.  His urine shows questionable urinary tract infection. Patient denies having any urinary discomfort except mild difficulty urinating  7:40 PM Labs remarkable for transaminitis with AST 286, AST 117 with normal alkaline phosphatase and total bili. No prior values for comparison. Patient does admits that he drinks alcohol on a regular basis last drink was 2 days ago. I recommend patient to follow-up with GI specialist and with ECP for further management of his transaminitis. Recommend alcohol cessation. Today his dilantin is low, he did receive a loading dose.    7:43 PM Labs also remarkable for an anion gap of 24, but no significant  alcohol level on exam.  Pt has received 2 L of NS.  Will give one additional 1L bolus, recheck BMP and lactic acid.  Care discussed with Dr. Silverio Lay.    9:28 PM Repeat BMP shows labs normalized and closed anion gap.  Pt did have a lactic acid of 2.89.  This is likely 2/2 to recent seizures.  Pt received additional IVF afterward.  At this time pt felt much better, at baseline and request to be discharge.  He will f/u with PCP and with GI specialist for further care.  Return precaution discussed. Alcohol cessation discussed.   Labs Review Labs Reviewed  CBC WITH DIFFERENTIAL/PLATELET - Abnormal; Notable for the following:    WBC 3.8 (*)    RBC 3.59 (*)  Hemoglobin 11.6 (*)    HCT 34.8 (*)    Platelets 83 (*)    Monocytes Relative 17 (*)    All other components within normal limits  COMPREHENSIVE METABOLIC PANEL - Abnormal; Notable for the following:    CO2 13 (*)    Glucose, Bld 123 (*)    Calcium 8.6 (*)    AST 286 (*)    ALT 117 (*)    Anion gap 24 (*)    All other components within normal limits  URINALYSIS, ROUTINE W REFLEX MICROSCOPIC (NOT AT [facility]) - Abnormal; Notable for the following:    Color, Urine AMBER (*)    APPearance CLOUDY (*)    pH 8.5 (*)    Bilirubin Urine SMALL (*)    Protein, ur 100 (*)    Leukocytes, UA SMALL (*)    All other components within normal limits  ETHANOL - Abnormal; Notable for the following:    Alcohol, Ethyl (B) 7 (*)    All other components within normal limits  PHENYTOIN LEVEL, TOTAL - Abnormal; Notable for the following:    Phenytoin Lvl <2.5 (*)    All other components within normal limits  BASIC METABOLIC PANEL - Abnormal; Notable for the following:    Glucose, Bld 110 (*)    Calcium 8.4 (*)    All other components within normal limits  CBG MONITORING, ED - Abnormal; Notable for the following:    Glucose-Capillary 125 (*)    All other components within normal limits  I-STAT CG4 LACTIC ACID, ED - Abnormal; Notable for the following:     Lactic Acid, Venous 2.89 (*)    All other components within normal limits  URINE RAPID DRUG SCREEN, HOSP PERFORMED  URINE MICROSCOPIC-ADD ON    Imaging Review Dg Chest Port 1 View  08/06/2015   CLINICAL DATA:  Pt had a seizure lasting ~1 minute at 1416 today. Pt's last seizure was last month. Pt states he may have missed his dilantin yesterday. Concern for aspiration during seizure. Smoker of 5 ppd x 5 years  EXAM: PORTABLE CHEST - 1 VIEW  COMPARISON:  12/14/2014  FINDINGS: The heart size and mediastinal contours are within normal limits. Both lungs are clear. No pleural effusion or pneumothorax. The visualized skeletal structures are unremarkable.  IMPRESSION: No active disease.   Electronically Signed   By: Amie Portland M.D.   On: 08/06/2015 16:04   I, Kaia Depaolis, personally reviewed and evaluated these images and lab results as part of my medical decision-making.   EKG Interpretation None      MDM   Final diagnoses:  Seizure  Alcohol abuse  Transaminitis    BP 121/80 mmHg  Pulse 78  Temp(Src) 98.6 F (37 C) (Oral)  Resp 16  SpO2 100%  I have reviewed nursing notes and vital signs. I personally viewed the imaging tests through PACS system and agrees with radiologist's intepretation I reviewed available ER/hospitalization records through the EMR     Fayrene Helper, PA-C 08/06/15 2134  Richardean Canal, MD 08/06/15 [phone]

## 2015-08-06 NOTE — ED Notes (Signed)
Per EMS, pt had a seizure lasting ~1 minute at 1416 today. Pt's last seizure was last month. Pt states he may have missed his dilantin yesterday.

## 2015-08-09 ENCOUNTER — Encounter (HOSPITAL_COMMUNITY): Payer: Self-pay | Admitting: *Deleted

## 2015-08-09 ENCOUNTER — Emergency Department (HOSPITAL_COMMUNITY)
Admission: EM | Admit: 2015-08-09 | Discharge: 2015-08-10 | Disposition: A | Discharge status: Other Acute Inpatient Hospital | Payer: Medicaid Other | Attending: Emergency Medicine | Admitting: Emergency Medicine

## 2015-08-09 DIAGNOSIS — F1010 Alcohol abuse, uncomplicated: Secondary | ICD-10-CM | POA: Diagnosis not present

## 2015-08-09 DIAGNOSIS — Z720 Tobacco use: Secondary | ICD-10-CM | POA: Diagnosis not present

## 2015-08-09 DIAGNOSIS — F250 Schizoaffective disorder, bipolar type: Secondary | ICD-10-CM | POA: Diagnosis present

## 2015-08-09 DIAGNOSIS — M19041 Primary osteoarthritis, right hand: Secondary | ICD-10-CM | POA: Insufficient documentation

## 2015-08-09 DIAGNOSIS — M19042 Primary osteoarthritis, left hand: Secondary | ICD-10-CM | POA: Insufficient documentation

## 2015-08-09 DIAGNOSIS — G40909 Epilepsy, unspecified, not intractable, without status epilepticus: Secondary | ICD-10-CM

## 2015-08-09 DIAGNOSIS — F1020 Alcohol dependence, uncomplicated: Secondary | ICD-10-CM | POA: Diagnosis present

## 2015-08-09 DIAGNOSIS — Z8659 Personal history of other mental and behavioral disorders: Secondary | ICD-10-CM

## 2015-08-09 DIAGNOSIS — Z046 Encounter for general psychiatric examination, requested by authority: Secondary | ICD-10-CM | POA: Diagnosis present

## 2015-08-09 LAB — CBC
HCT: 36.6 % — ABNORMAL LOW (ref 39.0–52.0)
HEMOGLOBIN: 12.4 g/dL — AB (ref 13.0–17.0)
MCH: 32.2 pg (ref 26.0–34.0)
MCHC: 33.9 g/dL (ref 30.0–36.0)
MCV: 95.1 fL (ref 78.0–100.0)
Platelets: 129 10*3/uL — ABNORMAL LOW (ref 150–400)
RBC: 3.85 MIL/uL — AB (ref 4.22–5.81)
RDW: 13.2 % (ref 11.5–15.5)
WBC: 4.5 10*3/uL (ref 4.0–10.5)

## 2015-08-09 LAB — COMPREHENSIVE METABOLIC PANEL
ALBUMIN: 4.4 g/dL (ref 3.5–5.0)
ALT: 63 U/L (ref 17–63)
AST: 54 U/L — AB (ref 15–41)
Alkaline Phosphatase: 64 U/L (ref 38–126)
Anion gap: 10 (ref 5–15)
BUN: 6 mg/dL (ref 6–20)
CHLORIDE: 98 mmol/L — AB (ref 101–111)
CO2: 27 mmol/L (ref 22–32)
CREATININE: 0.93 mg/dL (ref 0.61–1.24)
Calcium: 9.9 mg/dL (ref 8.9–10.3)
GFR calc Af Amer: >60 mL/min (ref >60)
GFR calc non Af Amer: >60 mL/min (ref >60)
GLUCOSE: 95 mg/dL (ref 65–99)
Potassium: 3.5 mmol/L (ref 3.5–5.1)
SODIUM: 135 mmol/L (ref 135–145)
Total Bilirubin: 0.7 mg/dL (ref 0.3–1.2)
Total Protein: 8.6 g/dL — ABNORMAL HIGH (ref 6.5–8.1)

## 2015-08-09 LAB — SALICYLATE LEVEL

## 2015-08-09 LAB — ETHANOL: Alcohol, Ethyl (B): 48 mg/dL — ABNORMAL HIGH (ref <5)

## 2015-08-09 LAB — RAPID URINE DRUG SCREEN, HOSP PERFORMED
AMPHETAMINES: NOT DETECTED
Barbiturates: NOT DETECTED
Benzodiazepines: NOT DETECTED
Cocaine: NOT DETECTED
OPIATES: NOT DETECTED
TETRAHYDROCANNABINOL: NOT DETECTED

## 2015-08-09 LAB — ACETAMINOPHEN LEVEL: Acetaminophen (Tylenol), Serum: <10 ug/mL — ABNORMAL LOW (ref 10–30)

## 2015-08-09 MED ORDER — LORAZEPAM 1 MG PO TABS: 1.0000 mg | ORAL_TABLET | Freq: Three times a day (TID) | ORAL | Status: DC | PRN

## 2015-08-09 MED ORDER — ALUM & MAG HYDROXIDE-SIMETH 200-200-20 MG/5ML PO SUSP: 30.0000 mL | ORAL | Status: DC | PRN

## 2015-08-09 MED ORDER — ACETAMINOPHEN 325 MG PO TABS: 650.0000 mg | ORAL_TABLET | ORAL | Status: DC | PRN

## 2015-08-09 MED ORDER — LORAZEPAM 1 MG PO TABS
0.0000 mg | ORAL_TABLET | Freq: Two times a day (BID) | ORAL | Status: DC
  Administered 2015-08-09: 1 mg via ORAL
  Filled 2015-08-09: qty 1

## 2015-08-09 MED ORDER — IBUPROFEN 200 MG PO TABS: 600.0000 mg | ORAL_TABLET | Freq: Three times a day (TID) | ORAL | Status: DC | PRN

## 2015-08-09 MED ORDER — VITAMIN B-1 100 MG PO TABS
100.0000 mg | ORAL_TABLET | Freq: Every day | ORAL | Status: DC
  Administered 2015-08-09 – 2015-08-10 (×2): 100 mg via ORAL
  Filled 2015-08-09 (×2): qty 1

## 2015-08-09 MED ORDER — ONDANSETRON HCL 4 MG PO TABS: 4.0000 mg | ORAL_TABLET | Freq: Three times a day (TID) | ORAL | Status: DC | PRN

## 2015-08-09 MED ORDER — NICOTINE 21 MG/24HR TD PT24
21.0000 mg | MEDICATED_PATCH | Freq: Every day | TRANSDERMAL | Status: DC
  Filled 2015-08-09: qty 1

## 2015-08-09 MED ORDER — ZOLPIDEM TARTRATE 5 MG PO TABS: 5.0000 mg | ORAL_TABLET | Freq: Every evening | ORAL | Status: DC | PRN

## 2015-08-09 MED ORDER — LORAZEPAM 1 MG PO TABS: 0.0000 mg | ORAL_TABLET | Freq: Four times a day (QID) | ORAL | Status: DC

## 2015-08-09 NOTE — ED Notes (Signed)
Patient given a [place] sandwich and soda.

## 2015-08-09 NOTE — ED Notes (Signed)
Pt brought in by [employer] with IVC papers.  IVC papers reports pt has hx of schizophrenia and is non-compliant with his meds.   "Appears to have difficulty recognizing people and is confused."  "Respondent is aggressive, and threatening towards family members."  Per [employer], pt reported to him that he has been drinking last night and today.  Pt is calm and cooperative at this time.  Pt verbalizes SI without a plan.
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
## 2015-08-09 NOTE — ED Provider Notes (Signed)
CSN: [id number]     Arrival date & time 08/09/15  1841 History   First MD Initiated Contact with Patient 08/09/15 2001     Chief Complaint  Patient presents with  . IVC    . Suicidal     (Consider location/radiation/quality/duration/timing/severity/associated sxs/prior Treatment) HPI Comments: [name] is a 39 y.o. male with a PMHx of prior substance abuse, AVN L hip s/p arthroplasty, schizophrenia, and seizures, who presents to the ED escorted by [employer] was IVC paperwork stating that "respondent has been diagnosed with schizophrenia and was prescribed several cyclotropic medications which she does not take regularly. He responded is hostile, aggressive, and threatening toward family members. Respondent reports hearing voices of people who are telling him to do things, seeing imaginary things. Respondent reports suicidal ideations but does not state a plan to do so. Respondent is abusing alcohol. The respondent has been committed to [facility], [facility], and [facility] facilities in the past. The respondent is a danger to himself and others." Upon my evaluation, pt denies SI/HI/AVH but admits to drinking 1 can of beer last night. Denies illicit drug use. States he's compliant with dilantin and a psychiatric med that he can't recall the name of, last took at 4pm. Denies any medical complaints.   Patient is a 39 y.o. male presenting with mental health disorder. The history is provided by the patient. No language interpreter was used.  Mental Health Problem Presenting symptoms: no hallucinations and no suicidal thoughts   Patient accompanied by:  Law enforcement Onset quality:  Unable to specify Timing:  Unable to specify Progression:  Unable to specify Chronicity:  Chronic Context: alcohol use and noncompliance   Treatment compliance:  Unable to specify Relieved by:  None tried Worsened by:  Nothing tried Ineffective treatments:  None tried Associated symptoms: no abdominal pain and no chest  pain   Risk factors: hx of mental illness     Past Medical History  Diagnosis Date  . Substance abuse     has not used for 5-6 yrs  . Avascular necrosis of hip     left  . Schizophrenia   . Seizures     HX [place] seiaures - last seizure 6-7 months ago  . Avascular necrosis     L HIP  . Arthritis     hands   Past Surgical History  Procedure Laterality Date  . No past surgeries    . 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 Cancer Brother   . Diabetes Mother   . Hypertension Mother   . Cancer Father   . Hypertension Father   . Hypertension Sister   . Diabetes Maternal Grandmother    Social History  Substance Use Topics  . Smoking status: Current Every Day Smoker -- 5.00 packs/day for 5 years    Types: Cigarettes  . Smokeless tobacco: Never Used  . Alcohol Use: 0.0 oz/week     Comment: PT STATES 4 cans beer daily -HIS SISTER SAYS HE HAS ALCOHOLISM-SHE IS CONCERNED ABOUT WITHDRAL AFTER SURGERY    Review of Systems  Constitutional: Negative for fever and chills.  Respiratory: Negative for shortness of breath.   Cardiovascular: Negative for chest pain.  Gastrointestinal: Negative for nausea, vomiting, abdominal pain, diarrhea and constipation.  Genitourinary: Negative for dysuria and  hematuria.  Musculoskeletal: Negative for myalgias and arthralgias.  Skin: Negative for color change.  Allergic/Immunologic: Negative for immunocompromised state.  Neurological: Negative for weakness and numbness.  Psychiatric/Behavioral: Negative for suicidal ideas, hallucinations and confusion.   10 Systems reviewed and are negative for acute change except as noted in the HPI.    Allergies  Bee venom  Home Medications   Prior to Admission medications   Medication Sig Start Date End  Date Taking? Authorizing Provider  aspirin EC 325 MG EC tablet Take 1 tablet (325 mg total) by mouth 2 (two) times daily. Patient not taking: Reported on 05/31/2015 05/01/13   Lanney Gins, PA-C  diphenhydrAMINE (BENADRYL) 25 MG tablet Take 2 tablets (50 mg total) by mouth every 6 (six) hours as needed for allergies (and swelling). Patient not taking: Reported on 05/31/2015 06/26/14   Cristabel Bicknell Camprubi-Soms, PA-C  docusate sodium 100 MG CAPS Take 100 mg by mouth 2 (two) times daily. Patient not taking: Reported on 05/31/2015 05/01/13   Lanney Gins, PA-C  EPINEPHrine (EPIPEN) 0.3 mg/0.3 mL IJ SOAJ injection Inject 0.3 mLs (0.3 mg total) into the muscle as needed. 06/26/14   Stedman [place] Camprubi-Soms, PA-C  ferrous sulfate 325 (65 FE) MG tablet Take 1 tablet (325 mg total) by mouth 3 (three) times daily after meals. Patient not taking: Reported on 05/31/2015 05/01/13   Lanney Gins, PA-C  phenytoin (DILANTIN) 100 MG ER capsule Take 3 capsules (300 mg total) by mouth at bedtime. TAKES ONE IN THE AM AND 2 IN THE PM 08/06/15   Fayrene Helper, PA-C  predniSONE (DELTASONE) 20 MG tablet 3 tabs po daily x 5 days Patient not taking: Reported on 05/31/2015 06/26/14   Ahmon Tosi Camprubi-Soms, PA-C  ranitidine (ZANTAC) 150 MG tablet Take 2 tablets (300 mg total) by mouth 2 (two) times daily. Patient not taking: Reported on 05/31/2015 06/26/14   Tekia [place]bury Camprubi-Soms, PA-C  traMADol (ULTRAM) 50 MG tablet Take 1 tablet (50 mg total) by mouth every 6 (six) hours as needed. Patient not taking: Reported on 08/06/2015 12/14/14   Kaitlyn Szekalski, PA-C   BP 131/86 mmHg  Pulse 102  Temp(Src) 98.5 F (36.9 C) (Oral)  Resp 20  SpO2 98% Physical Exam  Constitutional: He is oriented to person, place, and time. Vital signs are normal. He appears well-developed and well-nourished.  Non-toxic appearance. No distress.  Afebrile, nontoxic, NAD  HENT:  Head: Normocephalic and atraumatic.  Mouth/Throat: Oropharynx is clear and moist and mucous  membranes are normal.  Eyes: Conjunctivae and EOM are normal. Right eye exhibits no discharge. Left eye exhibits no discharge.  Neck: Normal range of motion. Neck supple.  Cardiovascular: Normal rate, regular rhythm, normal heart sounds and intact distal pulses.  Exam reveals no gallop and no friction rub.   No murmur heard. Pulmonary/Chest: Effort normal and breath sounds normal. No respiratory distress. He has no decreased breath sounds. He has no wheezes. He has no rhonchi. He has no rales.  Abdominal: Soft. Normal appearance and bowel sounds are normal. He exhibits no distension. There is no tenderness. There is no rigidity, no rebound, no guarding, no CVA tenderness, no tenderness at McBurney's point and negative Murphy's sign.  Musculoskeletal: Normal range of motion.  Neurological: He is alert and oriented to person, place, and time. He has normal strength. No sensory deficit. GCS eye subscore is 4. GCS verbal subscore is 5. GCS motor subscore is 6.  No focal neuro deficits, A&O x4  Skin: Skin is warm, dry and intact. No  rash noted.  Psychiatric: He has a normal mood and affect. His speech is normal. He is slowed. He expresses no homicidal and no suicidal ideation. He expresses no suicidal plans and no homicidal plans.  Denies SI/HI/AVH to me, but IVC paperwork states he has reported SI without plan, and AVH. Pt giggling and somewhat slowed, possibly under the influence of alcohol  Nursing note and vitals reviewed.   ED Course  Procedures (including critical care time) Labs Review Labs Reviewed  COMPREHENSIVE METABOLIC PANEL - Abnormal; Notable for the following:    Chloride 98 (*)    Total Protein 8.6 (*)    AST 54 (*)    All other components within normal limits  ETHANOL - Abnormal; Notable for the following:    Alcohol, Ethyl (B) 48 (*)    All other components within normal limits  ACETAMINOPHEN LEVEL - Abnormal; Notable for the following:    Acetaminophen (Tylenol), Serum <10  (*)    All other components within normal limits  CBC - Abnormal; Notable for the following:    RBC 3.85 (*)    Hemoglobin 12.4 (*)    HCT 36.6 (*)    Platelets 129 (*)    All other components within normal limits  SALICYLATE LEVEL  URINE RAPID DRUG SCREEN, HOSP PERFORMED    Imaging Review No results found. I, Camprubi-Soms, [place], personally reviewed and evaluated these images and lab results as part of my medical decision-making.   EKG Interpretation None      MDM   Final diagnoses:  Alcohol abuse  Hx of schizophrenia    39 y.o. male here with IVC paperwork stating he is noncompliant with meds, hostile/aggressive, threatening family members, abusing alcohol, heaving voice/seeing things, endorsing SI without plan. On my eval, pt A&Ox4 denying all of this. Pt giggling and relaxed, possibly under the influence of alcohol. Will get screening labs then obtain TTS consultation. Will reassess shortly.   9:48 PM CMP WNL aside from mildly elevated AST c/w alcoholic etiology. EtOH 48. Tylenol and salicylate levels WNL. CBC unremarkable aside from baseline anemia and low plts. UDS neg. Will place on CIWA protocol. Medically cleared, TTS notes state that he meets IP criteria, please see [facility] notes for further documentation of care.   BP 131/86 mmHg  Pulse 102  Temp(Src) 98.5 F (36.9 C) (Oral)  Resp 20  SpO2 98%  Meds ordered this encounter  Medications  . alum & mag hydroxide-simeth (MAALOX/MYLANTA) 200-200-20 MG/5ML suspension 30 mL    Sig:   . ondansetron (ZOFRAN) tablet 4 mg    Sig:   . nicotine (NICODERM CQ - dosed in mg/24 hours) patch 21 mg    Sig:   . zolpidem (AMBIEN) tablet 5 mg    Sig:   . ibuprofen (ADVIL,MOTRIN) tablet 600 mg    Sig:   . acetaminophen (TYLENOL) tablet 650 mg    Sig:   . DISCONTD: LORazepam (ATIVAN) tablet 1 mg    Sig:   . LORazepam (ATIVAN) tablet 0-4 mg    Sig:     Order Specific Question:  CIWA-AR < 5 =    Answer:  0 mg     Order Specific Question:  CIWA-AR 5 -10 =    Answer:  1 mg    Order Specific Question:  CIWA-AR 11 -15 =    Answer:  2 mg    Order Specific Question:  CIWA-AR 16 -24 =    Answer:  2 mg  Order Specific Question:  CIWA-AR 16 -24 =    Answer:  Recheck CIWA-AR in 1 hour; if > 15 notify MD    Order Specific Question:  CIWA-AR > 24 =    Answer:  4 mg    Order Specific Question:  CIWA-AR > 24 =    Answer:  Call Rapid Response  . LORazepam (ATIVAN) tablet 0-4 mg    Sig:     Order Specific Question:  CIWA-AR < 5 =    Answer:  0 mg    Order Specific Question:  CIWA-AR 5 -10 =    Answer:  1 mg    Order Specific Question:  CIWA-AR 11 -15 =    Answer:  2 mg    Order Specific Question:  CIWA-AR 16 -24 =    Answer:  2 mg    Order Specific Question:  CIWA-AR 16 -24 =    Answer:  Recheck CIWA-AR in 1 hour; if > 15 notify MD    Order Specific Question:  CIWA-AR > 24 =    Answer:  4 mg    Order Specific Question:  CIWA-AR > 24 =    Answer:  Call Rapid Response  . thiamine (VITAMIN B-1) tablet 100 mg    Sig:      Montserrat Shek Camprubi-Soms, PA-C 08/09/15 2151  Marily Memos, MD 08/10/15 1610

## 2015-08-10 ENCOUNTER — Inpatient Hospital Stay (HOSPITAL_COMMUNITY)
Admission: AD | Admit: 2015-08-10 | Discharge: 2015-08-16 | DRG: 885 | Disposition: A | Discharge status: Home / Self Care | Payer: Medicaid Other | Source: Intra-hospital | Attending: Psychiatry | Admitting: Psychiatry

## 2015-08-10 ENCOUNTER — Encounter (HOSPITAL_COMMUNITY): Payer: Self-pay

## 2015-08-10 DIAGNOSIS — Z8249 Family history of ischemic heart disease and other diseases of the circulatory system: Secondary | ICD-10-CM

## 2015-08-10 DIAGNOSIS — Z96642 Presence of left artificial hip joint: Secondary | ICD-10-CM | POA: Diagnosis present

## 2015-08-10 DIAGNOSIS — Z809 Family history of malignant neoplasm, unspecified: Secondary | ICD-10-CM | POA: Diagnosis not present

## 2015-08-10 DIAGNOSIS — Z9114 Patient's other noncompliance with medication regimen: Secondary | ICD-10-CM | POA: Diagnosis present

## 2015-08-10 DIAGNOSIS — G40909 Epilepsy, unspecified, not intractable, without status epilepticus: Secondary | ICD-10-CM

## 2015-08-10 DIAGNOSIS — E663 Overweight: Secondary | ICD-10-CM | POA: Diagnosis present

## 2015-08-10 DIAGNOSIS — Z6825 Body mass index (BMI) 25.0-25.9, adult: Secondary | ICD-10-CM

## 2015-08-10 DIAGNOSIS — F250 Schizoaffective disorder, bipolar type: Secondary | ICD-10-CM | POA: Diagnosis not present

## 2015-08-10 DIAGNOSIS — F17210 Nicotine dependence, cigarettes, uncomplicated: Secondary | ICD-10-CM | POA: Diagnosis present

## 2015-08-10 DIAGNOSIS — Z8659 Personal history of other mental and behavioral disorders: Secondary | ICD-10-CM | POA: Insufficient documentation

## 2015-08-10 DIAGNOSIS — F1010 Alcohol abuse, uncomplicated: Secondary | ICD-10-CM | POA: Diagnosis not present

## 2015-08-10 DIAGNOSIS — F200 Paranoid schizophrenia: Principal | ICD-10-CM | POA: Diagnosis present

## 2015-08-10 DIAGNOSIS — Z833 Family history of diabetes mellitus: Secondary | ICD-10-CM | POA: Diagnosis not present

## 2015-08-10 DIAGNOSIS — Z79899 Other long term (current) drug therapy: Secondary | ICD-10-CM

## 2015-08-10 DIAGNOSIS — R45851 Suicidal ideations: Secondary | ICD-10-CM | POA: Diagnosis present

## 2015-08-10 DIAGNOSIS — F1020 Alcohol dependence, uncomplicated: Secondary | ICD-10-CM | POA: Diagnosis present

## 2015-08-10 LAB — PHENYTOIN LEVEL, TOTAL: PHENYTOIN LVL: 20.5 ug/mL — AB (ref 10.0–20.0)

## 2015-08-10 MED ORDER — FAMOTIDINE 20 MG PO TABS
10.0000 mg | ORAL_TABLET | Freq: Once | ORAL | Status: AC
  Administered 2015-08-10: 10 mg via ORAL
  Filled 2015-08-10: qty 1
  Filled 2015-08-10: qty 0.5

## 2015-08-10 MED ORDER — DOCUSATE SODIUM 100 MG PO CAPS
100.0000 mg | ORAL_CAPSULE | Freq: Two times a day (BID) | ORAL | Status: DC
  Filled 2015-08-10 (×2): qty 1

## 2015-08-10 MED ORDER — FAMOTIDINE 10 MG PO TABS
10.0000 mg | ORAL_TABLET | Freq: Every day | ORAL | Status: DC
  Administered 2015-08-11 – 2015-08-15 (×5): 10 mg via ORAL
  Filled 2015-08-10 (×6): qty 1

## 2015-08-10 MED ORDER — VITAMIN B-1 100 MG PO TABS
100.0000 mg | ORAL_TABLET | Freq: Every day | ORAL | Status: DC
  Administered 2015-08-11 – 2015-08-16 (×6): 100 mg via ORAL
  Filled 2015-08-10 (×8): qty 1

## 2015-08-10 MED ORDER — PHENYTOIN SODIUM EXTENDED 100 MG PO CAPS
300.0000 mg | ORAL_CAPSULE | Freq: Every day | ORAL | Status: DC
  Administered 2015-08-10 – 2015-08-12 (×3): 300 mg via ORAL
  Filled 2015-08-10 (×5): qty 3

## 2015-08-10 MED ORDER — PHENYTOIN SODIUM EXTENDED 100 MG PO CAPS: 300.0000 mg | ORAL_CAPSULE | Freq: Every day | ORAL | Status: DC

## 2015-08-10 MED ORDER — IBUPROFEN 400 MG PO TABS: 400.0000 mg | ORAL_TABLET | Freq: Four times a day (QID) | ORAL | Status: DC | PRN

## 2015-08-10 MED ORDER — IBUPROFEN 200 MG PO TABS: 400.0000 mg | ORAL_TABLET | Freq: Four times a day (QID) | ORAL | Status: DC | PRN

## 2015-08-10 MED ORDER — LORAZEPAM 1 MG PO TABS: 0.0000 mg | ORAL_TABLET | Freq: Two times a day (BID) | ORAL | Status: DC

## 2015-08-10 MED ORDER — LORAZEPAM 1 MG PO TABS: 0.0000 mg | ORAL_TABLET | Freq: Four times a day (QID) | ORAL | Status: AC

## 2015-08-10 MED ORDER — FAMOTIDINE 20 MG PO TABS: 10.0000 mg | ORAL_TABLET | Freq: Every day | ORAL | Status: DC

## 2015-08-10 MED ORDER — DOCUSATE SODIUM 100 MG PO CAPS
100.0000 mg | ORAL_CAPSULE | Freq: Two times a day (BID) | ORAL | Status: DC
  Administered 2015-08-10 – 2015-08-16 (×12): 100 mg via ORAL
  Filled 2015-08-10 (×16): qty 1

## 2015-08-10 MED ORDER — TRAZODONE HCL 50 MG PO TABS
50.0000 mg | ORAL_TABLET | Freq: Every day | ORAL | Status: DC
  Administered 2015-08-10 – 2015-08-15 (×6): 50 mg via ORAL
  Filled 2015-08-10 (×8): qty 1

## 2015-08-10 MED ORDER — TRAZODONE HCL 50 MG PO TABS: 50.0000 mg | ORAL_TABLET | Freq: Every day | ORAL | Status: DC

## 2015-08-10 MED ORDER — FAMOTIDINE 10 MG PO TABS
10.0000 mg | ORAL_TABLET | Freq: Every day | ORAL | Status: DC
  Filled 2015-08-10: qty 1

## 2015-08-10 NOTE — BH Assessment (Signed)
Per Clint Bolder, AC, and [place], [place], I requested a Dilantin level on this pt before a determination could be made if he can be accepted at [facility].  Spoke with Dr. Clayborne Dana at [facility].  Beryle Flock, MS, CRC, [facility] Triage Specialist [facility]

## 2015-08-10 NOTE — Progress Notes (Addendum)
Admission Note: [name] is a 39 year old male involuntarily admitted to [facility] due to drinking and becoming threatening and aggressive towards his family.  He was making suicidal statements but endorsed no plan.  He is pleasant and cooperative.  He denies any A/V hallucinations or SI/HI at this time.  He feels that he has been admitted to the unit because his sister is wanting to go to the beach and he didn't want to give her money to go to the beach.  He admits to drinking "a few times per week" and will have "maybe "2" 12 beers at a time."  Poor eye contact.  He states that he lives with an 47 year old blind man that "I help take care of him."  Belongings (cell phone, shoes, ebt card and two business cards) secured in locker.  He was oriented to the unit.  Reviewed admission paperwork.  He states that he only takes Dilantin  at bedtime for seizures ,he reported that his last seizure was last week. He was not on psychiatric medications prior to this admit but has a past history of schizophrenia with poor follow up.  Last psychiatric hospitalization was in 2013 here at [facility].  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  Gait steady.  No apparent distress.  Initiated q 15 minute safety checks.

## 2015-08-10 NOTE — Progress Notes (Signed)
The focus of this group is to help patients review their daily goal of treatment and discuss progress on daily workbooks. Pt attended the evening group session but told the Writer he didn't have much to say yet. Pt did say that he had no additional needs from Nursing Staff this evening. He was encouraged by the Writer to please let staff know if any needs or questions arise and he agreed to do so. Pt's affect was appropriate.

## 2015-08-10 NOTE — Plan of Care (Signed)
Problem: Alteration in mood & ability to function due to Goal: STG-Patient will attend groups Outcome: Progressing Pt attended evening wrap up group     

## 2015-08-10 NOTE — ED Notes (Signed)
Patient transferred to [facility].  Left the unit ambulatory with [employer].  All belongings given to the Equities trader.

## 2015-08-10 NOTE — Progress Notes (Signed)
Patient ID: [name], male   DOB: December 26, 1975, 39 y.o.   MRN: [id number] D: Patient pleasant on approach. Pt reports his sister IVCed him because he won't give her money for a beach trip. Pt denies any withdrawal symptoms. Pt stated he has been compliant with medication. Pt denies SI/HI/AVH and pain. Pt attended and participated in evening wrap up group. Cooperative with assessment. No acute distressed noted at this time.   A: Met with pt 1:1. Medications administered as prescribed. Support and encouragement provided to attend groups and engage in milieu. Pt encouraged to discuss feelings and come to staff with any question or concerns.   R: Patient remains safe is safe and complaint with medications. Pt reports court date on Thursday  08/12/2015 for selling "pot'.

## 2015-08-10 NOTE — Tx Team (Signed)
Initial Interdisciplinary Treatment Plan   PATIENT STRESSORS: Legal issue   PATIENT STRENGTHS: Capable of independent living Supportive family/friends   PROBLEM LIST: Problem List/Patient Goals Date to be addressed Date deferred Reason deferred Estimated date of resolution  Substance Abuse 08/10/15           Psychosis 08/10/15           Pt. Refused to provide two goals                               DISCHARGE CRITERIA:  Adequate post-discharge living arrangements Improved stabilization in mood, thinking, and/or behavior Need for constant or close observation no longer present Verbal commitment to aftercare and medication compliance  PRELIMINARY DISCHARGE PLAN: Outpatient therapy Return to previous living arrangement  PATIENT/FAMIILY INVOLVEMENT: This treatment plan has been presented to and reviewed with the patient, [facility].  The patient and family have been given the opportunity to ask questions and make suggestions.  Norm Parcel Anjel Perfetti 08/10/2015, 6:07 PM

## 2015-08-10 NOTE — Consult Note (Addendum)
[facility] Consult   Reason for Consult: Alcohol use disorder, severe, Schizoaffective disorder, Bipolar type, Schizophrenia, undifferenciated Referring Physician:  EDP Patient Identification: [name] MRN:  [id number] Principal Diagnosis: Schizoaffective disorder, bipolar type Diagnosis:   Patient Active Problem List   Diagnosis Date Noted  . Schizoaffective disorder, bipolar type [F25.0] 02/26/2012    Priority: High    Class: Acute  . Seizure disorder [G40.909] 08/10/2015  . Alcohol use disorder, severe, dependence [F10.20] 08/10/2015  . Expected blood loss anemia [D50.0] 04/30/2013  . Overweight (BMI 25.0-29.9) [E66.3] 04/30/2013  . S/P left THA, AA [Z96.60] 04/29/2013  . Seizure [R56.9] 01/27/2013  . Alcohol abuse [F10.10] 01/27/2013  . Schizophrenia [F20.9] 01/27/2013    Total Time spent with patient: 1 hour  Subjective:   [name] is a 39 y.o. male patient admitted with Alcohol use disorder, severe, Schizoaffective disorder, Bipolar type, Schizophrenia, undifferenciated  HPI:  AA male, 39 years old was evaluated for not taking his Bipolar medications.  Patient was brought in by [employer] under IVC taken out by his family for not taking his Bipolar medications.  Patient today did not know the reasons for his visit to the ER but stated that he was set up by "couple of girls including his sister for using"pills"  Patient reported that he takes one to two beers a day but also stated that he served 6 months in [place] for 2 DUI.  Patient reported that he could not remember the last time he took his Bipolar medications.  His LFT are elevated and his Alcohol level was 41 on arrival to the ER.   He denies SI/HI AH but reports visual hallucination which could be as a result of Alcohol withdrawal symptoms.   Patient has been accepted for admission and has a bed assigned at [facility].  HPI Elements:   Location:  Alcohol use disorder, severe, Schizoaffective disorder, Bipolar  type. Quality:  severe. Severity:  severe. Timing:  Acute. Duration:  Chronic mental illness. Context:  IVC by family for medication non compliance..  Past Medical History:  Past Medical History  Diagnosis Date  . Substance abuse     has not used for 5-6 yrs  . Avascular necrosis of hip     left  . Schizophrenia   . Seizures     HX [place] seiaures - last seizure 6-7 months ago  . Avascular necrosis     L HIP  . Arthritis     hands    Past Surgical History  Procedure Laterality Date  . No past surgeries    . Total hip arthroplasty Left 04/29/2013    Procedure: TOTAL HIP ARTHROPLASTY ANTERIOR APPROACH;  Surgeon: Mauri Pole, MD;  Location: [facility];  Service: Orthopedics;  Laterality: Left;   Family History:  Family History  Problem Relation Age of Onset  . Cancer Brother   . Diabetes Mother   . Hypertension Mother   . Cancer Father   . Hypertension Father   . Hypertension Sister   . Diabetes Maternal Grandmother    Social History:  History  Alcohol Use  . 0.0 oz/week    Comment: PT STATES 4 cans beer daily -HIS SISTER SAYS HE HAS ALCOHOLISM-SHE IS CONCERNED ABOUT WITHDRAL AFTER SURGERY     History  Drug Use No    Social History   Social History  . Marital Status: Single    Spouse Name: N/A  . Number of Children: N/A  . Years of Education: N/A  Social History Main Topics  . Smoking status: Current Every Day Smoker -- 5.00 packs/day for 5 years    Types: Cigarettes  . Smokeless tobacco: Never Used  . Alcohol Use: 0.0 oz/week     Comment: PT STATES 4 cans beer daily -HIS SISTER SAYS HE HAS ALCOHOLISM-SHE IS CONCERNED ABOUT WITHDRAL AFTER SURGERY  . Drug Use: No  . Sexual Activity: Yes    Birth Control/ Protection: Condom   Other Topics Concern  . None   Social History Narrative   Additional Social History:    Prescriptions: See PTA list History of alcohol / drug use?: Yes Longest period of sobriety (when/how long): "don't know" Name of  Substance 1: Alcohol 1 - Age of First Use: "teens" 1 - Amount (size/oz): 1 22 oz can of beer 1 - Frequency: 2 x week 1 - Duration: "years" 1 - Last Use / Amount: today 08/09/15                   Allergies:   Allergies  Allergen Reactions  . Bee Venom Anaphylaxis    Labs:  Results for orders placed or performed during the hospital encounter of 08/09/15 (from the past 48 hour(s))  Urine rapid drug screen (hosp performed) (Not at [facility])     Status: None   Collection Time: 08/09/15  8:05 PM  Result Value Ref Range   Opiates NONE DETECTED NONE DETECTED   Cocaine NONE DETECTED NONE DETECTED   Benzodiazepines NONE DETECTED NONE DETECTED   Amphetamines NONE DETECTED NONE DETECTED   Tetrahydrocannabinol NONE DETECTED NONE DETECTED   Barbiturates NONE DETECTED NONE DETECTED    Comment:        DRUG SCREEN FOR MEDICAL PURPOSES ONLY.  IF CONFIRMATION IS NEEDED FOR ANY PURPOSE, NOTIFY LAB WITHIN 5 DAYS.        LOWEST DETECTABLE LIMITS FOR URINE DRUG SCREEN Drug Class       Cutoff (ng/mL) Amphetamine      1000 Barbiturate      200 Benzodiazepine   709 Tricyclics       628 Opiates          300 Cocaine          300 THC              50   Comprehensive metabolic panel     Status: Abnormal   Collection Time: 08/09/15  8:35 PM  Result Value Ref Range   Sodium 135 135 - 145 mmol/L   Potassium 3.5 3.5 - 5.1 mmol/L   Chloride 98 (L) 101 - 111 mmol/L   CO2 27 22 - 32 mmol/L   Glucose, Bld 95 65 - 99 mg/dL   BUN 6 6 - 20 mg/dL   Creatinine, Ser 0.93 0.61 - 1.24 mg/dL   Calcium 9.9 8.9 - 10.3 mg/dL   Total Protein 8.6 (H) 6.5 - 8.1 g/dL   Albumin 4.4 3.5 - 5.0 g/dL   AST 54 (H) 15 - 41 U/L   ALT 63 17 - 63 U/L   Alkaline Phosphatase 64 38 - 126 U/L   Total Bilirubin 0.7 0.3 - 1.2 mg/dL   GFR calc non Af Amer >60 >60 mL/min   GFR calc Af Amer >60 >60 mL/min    Comment: (NOTE) The eGFR has been calculated using the CKD EPI equation. This calculation has not been validated in  all clinical situations. eGFR's persistently <60 mL/min signify possible Chronic Kidney Disease.    Anion gap 10  5 - 15  Ethanol (ETOH)     Status: Abnormal   Collection Time: 08/09/15  8:35 PM  Result Value Ref Range   Alcohol, Ethyl (B) 48 (H) <5 mg/dL    Comment:        LOWEST DETECTABLE LIMIT FOR SERUM ALCOHOL IS 5 mg/dL FOR MEDICAL PURPOSES ONLY   Salicylate level     Status: None   Collection Time: 08/09/15  8:35 PM  Result Value Ref Range   Salicylate Lvl <6.5 2.8 - 30.0 mg/dL  Acetaminophen level     Status: Abnormal   Collection Time: 08/09/15  8:35 PM  Result Value Ref Range   Acetaminophen (Tylenol), Serum <10 (L) 10 - 30 ug/mL    Comment:        THERAPEUTIC CONCENTRATIONS VARY SIGNIFICANTLY. A RANGE OF 10-30 ug/mL MAY BE AN EFFECTIVE CONCENTRATION FOR MANY PATIENTS. HOWEVER, SOME ARE BEST TREATED AT CONCENTRATIONS OUTSIDE THIS RANGE. ACETAMINOPHEN CONCENTRATIONS >150 ug/mL AT 4 HOURS AFTER INGESTION AND >50 ug/mL AT 12 HOURS AFTER INGESTION ARE OFTEN ASSOCIATED WITH TOXIC REACTIONS.   CBC     Status: Abnormal   Collection Time: 08/09/15  8:35 PM  Result Value Ref Range   WBC 4.5 4.0 - 10.5 K/uL   RBC 3.85 (L) 4.22 - 5.81 MIL/uL   Hemoglobin 12.4 (L) 13.0 - 17.0 g/dL   HCT 36.6 (L) 39.0 - 52.0 %   MCV 95.1 78.0 - 100.0 fL   MCH 32.2 26.0 - 34.0 pg   MCHC 33.9 30.0 - 36.0 g/dL   RDW 13.2 11.5 - 15.5 %   Platelets 129 (L) 150 - 400 K/uL  Phenytoin level, total     Status: Abnormal   Collection Time: 08/10/15  3:45 AM  Result Value Ref Range   Phenytoin Lvl 20.5 (H) 10.0 - 20.0 ug/mL    Vitals: Blood pressure 112/78, pulse 69, temperature 98.4 F (36.9 C), temperature source Oral, resp. rate 16, SpO2 100 %.  Risk to Self: Suicidal Ideation: No (denies) Suicidal Intent: No (denies) Is patient at risk for suicide?: No (denies) Suicidal Plan?: No (denies) Access to Means: No (denies) What has been your use of drugs/alcohol within the last 12  months?: weekly per pt How many times?: 0 Other Self Harm Risks: none noted Triggers for Past Attempts:  (na) Intentional Self Injurious Behavior: None (denies) Risk to Others: Homicidal Ideation: No (denies) Thoughts of Harm to Others: No (denies) Current Homicidal Intent: No (denies) Current Homicidal Plan: No (denies) Access to Homicidal Means: No (denies) Identified Victim: na History of harm to others?: No (denies) Assessment of Violence: None Noted Violent Behavior Description: na Does patient have access to weapons?: No (denies) Criminal Charges Pending?: No (denies) Does patient have a court date: No (denies) Prior Inpatient Therapy: Prior Inpatient Therapy: Yes Prior Therapy Dates: multiple Prior Therapy Facilty/Provider(s): [facility], [facility], [place] Reason for Treatment: Schizoaffective D/O Prior Outpatient Therapy: Prior Outpatient Therapy: Yes Prior Therapy Dates: multiple Prior Therapy Facilty/Provider(s): [facility] Reason for Treatment: Schizoaffective D/O Does patient have an ACCT team?: Unknown Does patient have Intensive In-House Services?  : No Does patient have [facility] services? : Yes Does patient have P4CC services?: No  Current Facility-Administered Medications  Medication Dose Route Frequency Provider Last Rate Last Dose  . alum & mag hydroxide-simeth (MAALOX/MYLANTA) 200-200-20 MG/5ML suspension 30 mL  30 mL Oral PRN Mercedes Camprubi-Soms, PA-C      . docusate sodium (COLACE) capsule 100 mg  100 mg Oral BID Denym Christenberry  100 mg at 08/10/15 1259  . famotidine (PEPCID) tablet 10 mg  10 mg Oral QHS Brindley Madarang      . ibuprofen (ADVIL,MOTRIN) tablet 400 mg  400 mg Oral Q6H PRN Aarianna Hoadley      . ibuprofen (ADVIL,MOTRIN) tablet 600 mg  600 mg Oral Q8H PRN Mercedes Camprubi-Soms, PA-C      . LORazepam (ATIVAN) tablet 0-4 mg  0-4 mg Oral 4 times per day Mercedes Camprubi-Soms, PA-C   0 mg at 08/10/15 0001  . LORazepam (ATIVAN) tablet 0-4 mg  0-4 mg Oral  Q12H Mercedes Camprubi-Soms, PA-C   1 mg at 08/09/15 2210  . nicotine (NICODERM CQ - dosed in mg/24 hours) patch 21 mg  21 mg Transdermal Daily Mercedes Camprubi-Soms, PA-C   21 mg at 08/09/15 2130  . ondansetron (ZOFRAN) tablet 4 mg  4 mg Oral Q8H PRN Mercedes Camprubi-Soms, PA-C      . phenytoin (DILANTIN) ER capsule 300 mg  300 mg Oral QHS Tyre Beaver      . thiamine (VITAMIN B-1) tablet 100 mg  100 mg Oral Daily Mercedes Camprubi-Soms, PA-C   100 mg at 08/10/15 1042  . traZODone (DESYREL) tablet 50 mg  50 mg Oral QHS Erla Bacchi       Current Outpatient Prescriptions  Medication Sig Dispense Refill  . phenytoin (DILANTIN) 100 MG ER capsule Take 3 capsules (300 mg total) by mouth at bedtime. TAKES ONE IN THE AM AND 2 IN THE PM 30 capsule 0  . aspirin EC 325 MG EC tablet Take 1 tablet (325 mg total) by mouth 2 (two) times daily. (Patient not taking: Reported on 05/31/2015) 30 tablet   . diphenhydrAMINE (BENADRYL) 25 MG tablet Take 2 tablets (50 mg total) by mouth every 6 (six) hours as needed for allergies (and swelling). (Patient not taking: Reported on 05/31/2015) 40 tablet 0  . docusate sodium 100 MG CAPS Take 100 mg by mouth 2 (two) times daily. (Patient not taking: Reported on 05/31/2015) 10 capsule   . EPINEPHrine (EPIPEN) 0.3 mg/0.3 mL IJ SOAJ injection Inject 0.3 mLs (0.3 mg total) into the muscle as needed. 2 Device 0  . ferrous sulfate 325 (65 FE) MG tablet Take 1 tablet (325 mg total) by mouth 3 (three) times daily after meals. (Patient not taking: Reported on 05/31/2015)    . predniSONE (DELTASONE) 20 MG tablet 3 tabs po daily x 5 days (Patient not taking: Reported on 05/31/2015) 15 tablet 0  . ranitidine (ZANTAC) 150 MG tablet Take 2 tablets (300 mg total) by mouth 2 (two) times daily. (Patient not taking: Reported on 05/31/2015) 20 tablet 0  . traMADol (ULTRAM) 50 MG tablet Take 1 tablet (50 mg total) by mouth every 6 (six) hours as needed. (Patient not taking: Reported on 08/06/2015) 15  tablet 0  . [DISCONTINUED] haloperidol (HALDOL) 5 MG tablet Take 1 tablet (5 mg total) by mouth 3 (three) times daily. 1 tablet in the morning and 2 at bedtime, for psychosis/voices. 90 tablet 0    Musculoskeletal: Strength & Muscle Tone: within normal limits Gait & Station: normal Patient leans: N/A  Psychiatric Specialty Exam: Physical Exam  Review of Systems  Constitutional: Negative.   HENT: Negative.   Eyes: Negative.   Respiratory: Negative.   Cardiovascular: Negative.   Gastrointestinal: Negative.   Genitourinary: Negative.   Musculoskeletal: Negative.   Skin: Negative.   Neurological: Positive for seizures (Seizure disorder on Medications,).  Endo/Heme/Allergies: Negative.     Blood pressure  112/78, pulse 69, temperature 98.4 F (36.9 C), temperature source Oral, resp. rate 16, SpO2 100 %.There is no weight on file to calculate BMI.  General Appearance: Casual and Disheveled  Eye Contact::  Minimal  Speech:  Clear and Coherent and Slow  Volume:  Decreased  Mood:  Depressed  Affect:  Congruent and Depressed  Thought Process:  Coherent  Orientation:  Full (Time, Place, and Person)  Thought Content:  Hallucinations: Visual  Suicidal Thoughts:  No  Homicidal Thoughts:  No  Memory:  Immediate;   Poor Recent;   Poor Remote;   Poor  Judgement:  Impaired  Insight:  Shallow  Psychomotor Activity:  Normal  Concentration:  Fair  Recall:  Poor  Fund of Knowledge:Poor  Language: Fair  Akathisia:  NA  Handed:  Right  AIMS (if indicated):     Assets:  Desire for Improvement Housing Physical Health Transportation Vocational/Educational  ADL's:  Impaired  Cognition: WNL  Sleep:      Medical Decision Making: Review of Psycho-Social Stressors (1), Established Problem, Worsening (2), Review of Medication Regimen & Side Effects (2) and Review of New Medication or Change in Dosage (2)  Treatment Plan Summary: Daily contact with patient to assess and evaluate symptoms  and progress in treatment and Medication management  Plan:  We are using our Ativan detox treatment protocol to detox patient.  We have resumed his Dilantin for Seizures, We will use Trazodone for sleep.  Once is safely detoxed and free from Alcohol withdrawal symptoms we will address his Schizoaffective disorder. Disposition:   Admitted with bed assigned  Delfin Gant   PMHNP-BC 08/10/2015 1:26 PM Patient seen face-to-face for psychiatric evaluation, chart reviewed and case discussed with the physician extender and developed treatment plan. Reviewed the information documented and agree with the treatment plan. Corena Pilgrim, MD

## 2015-08-11 ENCOUNTER — Encounter (HOSPITAL_COMMUNITY): Payer: Self-pay | Admitting: Psychiatry

## 2015-08-11 DIAGNOSIS — F200 Paranoid schizophrenia: Secondary | ICD-10-CM | POA: Diagnosis present

## 2015-08-11 MED ORDER — LORAZEPAM 2 MG/ML IJ SOLN: 2.0000 mg | INTRAMUSCULAR | Status: DC | PRN

## 2015-08-11 MED ORDER — BENZTROPINE MESYLATE 0.5 MG PO TABS
0.5000 mg | ORAL_TABLET | Freq: Every evening | ORAL | Status: DC
  Administered 2015-08-11: 0.5 mg via ORAL
  Filled 2015-08-11 (×2): qty 1

## 2015-08-11 MED ORDER — BENZOCAINE 10 % MT GEL
OROMUCOSAL | Status: DC | PRN
  Filled 2015-08-11: qty 9.4

## 2015-08-11 MED ORDER — LEVETIRACETAM 500 MG PO TABS
500.0000 mg | ORAL_TABLET | Freq: Two times a day (BID) | ORAL | Status: DC
  Administered 2015-08-11 – 2015-08-16 (×10): 500 mg via ORAL
  Filled 2015-08-11 (×12): qty 1

## 2015-08-11 MED ORDER — HALOPERIDOL 5 MG PO TABS
5.0000 mg | ORAL_TABLET | Freq: Every evening | ORAL | Status: DC
  Administered 2015-08-11: 5 mg via ORAL
  Filled 2015-08-11 (×2): qty 1

## 2015-08-11 NOTE — [facility] Group Notes (Signed)
[facility] LCSW Aftercare Discharge Planning Group Note   08/11/2015 1:35 PM  Participation Quality:  Engaged  Mood/Affect:  Flat  Depression Rating:  denies  Anxiety Rating:  denies  Thoughts of Suicide:  No Will you contract for safety?   NA  Current AVH:  denies  Plan for Discharge/Comments:  Insists his sister took out IVC paperwork "because she wanted me to go to the beach with her and her family, and I had a court hearing this week to get off of probation.  It got heated, and the next thing I know, I am in the hospital.  Admits that he loses his cool with his family, and gave another example when his niece threw away his fish sandwhich that was in the microwave.  Insists he does not get violent, "but they know when I am angry."  Transportation Means:   Supports:  [place], [place] Distance B

## 2015-08-11 NOTE — Progress Notes (Signed)
D  Pt is pleasant and cooperative   He tends to isolate to his room and has little interaction with others   He did attend group this evening and was appropriate A   Verbal support given   Medications administered and effectiveness monitored   Q 15 min checks R   Pt safe at present

## 2015-08-11 NOTE — Progress Notes (Signed)
Discussed with on call Neuro MD regarding Phenytoin level of 20.5  No new orders given at this time.  Spoke with Pharm and will continue to follow levels.  20.5 is high end of therapeutic level.

## 2015-08-11 NOTE — [facility] Suicide Risk Assessment (Signed)
[facility] Admission Suicide Risk Assessment   Nursing information obtained from:  Patient Demographic factors:  Male, Low socioeconomic status Current Mental Status:  NA Loss Factors:  Legal issues Historical Factors:  NA Risk Reduction Factors:  NA Total Time spent with patient: 30 minutes Principal Problem: Paranoid schizophrenia Diagnosis:   Patient Active Problem List   Diagnosis Date Noted  . Paranoid schizophrenia [F20.0] 08/11/2015  . Seizure disorder [G40.909] 08/10/2015  . Alcohol use disorder, severe, dependence [F10.20] 08/10/2015  . Hx of schizophrenia [Z86.59]   . Expected blood loss anemia [D50.0] 04/30/2013  . Overweight (BMI 25.0-29.9) [E66.3] 04/30/2013  . S/P left THA, AA [Z96.60] 04/29/2013     Continued Clinical Symptoms:  Alcohol Use Disorder Identification Test Final Score (AUDIT): 3 The "Alcohol Use Disorders Identification Test", Guidelines for Use in Primary Care, Second Edition.  World Science writer [facility]). Score between 0-7:  no or low risk or alcohol related problems. Score between 8-15:  moderate risk of alcohol related problems. Score between 16-19:  high risk of alcohol related problems. Score 20 or above:  warrants further diagnostic evaluation for alcohol dependence and treatment.   CLINICAL FACTORS:   Alcohol/Substance Abuse/Dependencies Previous Psychiatric Diagnoses and Treatments Medical Diagnoses and Treatments/Surgeries   Musculoskeletal: Strength & Muscle Tone: within normal limits Gait & Station: normal Patient leans: N/A  Psychiatric Specialty Exam: Physical Exam  ROS  Blood pressure 120/82, pulse 82, temperature 98.6 F (37 C), temperature source Oral, resp. rate 18, height 5' 4.5" (1.638 m), weight 68.04 kg (150 lb), SpO2 100 %.Body mass index is 25.36 kg/(m^2).     For MSE - see H & p.   COGNITIVE FEATURES THAT CONTRIBUTE TO RISK:  Closed-mindedness, Polarized thinking and Thought constriction (tunnel vision)     SUICIDE RISK:   Mild:  Suicidal ideation of limited frequency, intensity, duration, and specificity.  There are no identifiable plans, no associated intent, mild dysphoria and related symptoms, good self-control (both objective and subjective assessment), few other risk factors, and identifiable protective factors, including available and accessible social support.  PLAN OF CARE: Please see H&P.   Medical Decision Making:  Review of Psycho-Social Stressors (1), Review or order clinical lab tests (1), Review and summation of old records (2), Established Problem, Worsening (2), Review of Last Therapy Session (1), Review of Medication Regimen & Side Effects (2) and Review of New Medication or Change in Dosage (2)  I certify that inpatient services furnished can reasonably be expected to improve the patient's condition.   Alvon Nygaard MD 08/11/2015, 12:09 PM

## 2015-08-11 NOTE — Plan of Care (Signed)
Problem: Alteration in mood & ability to function due to Goal: LTG-Patient demonstrates decreased signs of withdrawal (Patient demonstrates decreased signs of withdrawal to the point the patient is safe to return home and continue treatment in an outpatient setting)  Outcome: Progressing Maurion denies any withdrawal symptoms at this time.

## 2015-08-11 NOTE — H&P (Addendum)
Psychiatric Admission Assessment Adult  Patient Identification: [name] MRN:  [id number] Date of Evaluation:  08/11/2015 Chief Complaint: Patient states " I do not get along with my sister.'       Principal Diagnosis: Paranoid schizophrenia Diagnosis:   Patient Active Problem List   Diagnosis Date Noted  . Paranoid schizophrenia [F20.0] 08/11/2015  . Seizure disorder [G40.909] 08/10/2015  . Alcohol use disorder, severe, dependence [F10.20] 08/10/2015  . Hx of schizophrenia [Z86.59]   . Expected blood loss anemia [D50.0] 04/30/2013  . Overweight (BMI 25.0-29.9) [E66.3] 04/30/2013  . S/P left THA, AA [Z96.60] 04/29/2013      History of Present Illness:: [name] is a 39 y.o. AA male with a PMHx of prior substance abuse, AVN L hip s/p arthroplasty, schizophrenia, and seizures, who presented to the ED escorted by [employer] with IVC paperwork stating that "respondent has been diagnosed with schizophrenia and was prescribed several medications which she does not take regularly. The respondant is hostile, aggressive, and threatening toward family members. Respondent reports hearing voices of people who are telling him to do things, seeing imaginary things. Respondent reports suicidal ideations but does not state a plan to do so. Respondent is abusing alcohol. The respondent has been committed to [facility], [facility], and [facility] facilities in the past. The respondent is a danger to himself and others."  Patient seen this AM. Pt reports that he does not get along with his sister and that is why he was committed. Pt reports that he does not abuse alcohol that much but uses it every now and then. Pt reports that he has been noncompliant on his medications. Pt also reports having back to back seizures right before coming to the ED this admission - which could also would have contributed to his presentation. Pt reports being on Dilantin 100 mg - 3 a day . However his Dilantin level is high at 20.5  ( 08/10/15) . Pt reports taking it as prescribed , he does skips doses sometimes.  Pt per EHR - was also noted to state that he is here because his friends wanted to go to the beach and he could not go since he were on probation.  Pt today - although appears slow and delayed is able to answer questions appropriately. Pt denies any paranoia, psychosis. Pt reports sleep as affected and feeling tired. Denies any other mood sx.  Pt has had several admissions- [facility], [facility] . Pt was on several psychotropic medications in the past including Risperidone , zoloft.     Elements:  Location:  psychosis, agitation. Quality:  see above. Severity:  severe. Timing:  acute. Duration:  past 3 days. Context:  hx of schizophrenia, alcohol abuse, seizures, noncompliant on medications.. Associated Signs/Symptoms: Depression Symptoms:  depressed mood, psychomotor retardation, (Hypo) Manic Symptoms:  Distractibility, Hallucinations, Impulsivity, Irritable Mood, Anxiety Symptoms:  anxiety unspecified Psychotic Symptoms:  Hallucinations: Auditory PTSD Symptoms: NA Total Time spent with patient: 1 hour  Past Medical History:  Past Medical History  Diagnosis Date  . Substance abuse     has not used for 5-6 yrs  . Avascular necrosis of hip     left  . Schizophrenia   . Seizures     HX [place] seiaures - last seizure 6-7 months ago  . Avascular necrosis     L HIP  . Arthritis     hands    Past Surgical History  Procedure Laterality Date  . No past surgeries    .   Total hip arthroplasty Left 04/29/2013    Procedure: TOTAL HIP ARTHROPLASTY ANTERIOR APPROACH;  Surgeon: Matthew D Olin, MD;  Location: [facility];  Service: Orthopedics;  Laterality: Left;   Family History:  Family History  Problem Relation Age of Onset  . Cancer Brother   . Diabetes Mother   . Hypertension Mother   . Cancer Father   . Hypertension Father   . Hypertension Sister   . Diabetes Maternal Grandmother     Social History:  History  Alcohol Use  . 3.6 oz/week  . 6 Cans of beer per week    Comment: Pt. states that he drinks beer every couple of days 2 (12 oz cans)     History  Drug Use No    Social History   Social History  . Marital Status: Single    Spouse Name: N/A  . Number of Children: N/A  . Years of Education: N/A   Social History Main Topics  . Smoking status: Current Every Day Smoker -- 1.50 packs/day for 5 years    Types: Cigarettes  . Smokeless tobacco: Never Used  . Alcohol Use: 3.6 oz/week    6 Cans of beer per week     Comment: Pt. states that he drinks beer every couple of days 2 (12 oz cans)  . Drug Use: No  . Sexual Activity: Yes    Birth Control/ Protection: Condom   Other Topics Concern  . None   Social History Narrative   Additional Social History:         Patient is on SSD for mental illness. Currently lives with a friend who is blind. Pt is on probation.                  Musculoskeletal: Strength & Muscle Tone: within normal limits Gait & Station: normal Patient leans: N/A  Psychiatric Specialty Exam: Physical Exam  Constitutional: He is oriented to person, place, and time. He appears well-developed and well-nourished.  HENT:  Head: Normocephalic and atraumatic.  Eyes: Conjunctivae and EOM are normal.  Neck: Normal range of motion. Neck supple.  Cardiovascular: Normal rate and regular rhythm.   Respiratory: Effort normal and breath sounds normal.  GI: Soft.  Musculoskeletal: Normal range of motion.  Neurological: He is alert and oriented to person, place, and time.  Skin: Skin is warm.  Psychiatric: He has a normal mood and affect. Judgment normal. His speech is delayed. He is slowed. Thought content is paranoid. Cognition and memory are normal. He is inattentive.    Review of Systems  Psychiatric/Behavioral: Negative.   All other systems reviewed and are negative.   Blood pressure 120/82, pulse 82, temperature 98.6 F (37  C), temperature source Oral, resp. rate 18, height 5' 4.5" (1.638 m), weight 68.04 kg (150 lb), SpO2 100 %.Body mass index is 25.36 kg/(m^2).  General Appearance: Disheveled  Eye Contact::  Minimal  Speech:  Slow  Volume:  Decreased  Mood:  Anxious  Affect:  Blunt  Thought Process:  Coherent  Orientation:  Other:  to person ,place  Thought Content:  Hallucinations: Auditory  Suicidal Thoughts:  No  Homicidal Thoughts:  No  Memory:  Immediate;   Fair Recent;   Fair Remote;   Fair  Judgement:  Impaired  Insight:  Shallow  Psychomotor Activity:  Decreased  Concentration:  Poor  Recall:  Fair  Fund of Knowledge:Fair  Language: Fair  Akathisia:  No  Handed:  Right  AIMS (if indicated):       Assets:  Social Support  ADL's:  Intact  Cognition: WNL  Sleep:  Number of Hours: 4.75   Risk to Self: Is patient at risk for suicide?: No Risk to Others:  yes- was hostile , agitated prior to admission Prior Inpatient Therapy:  yes several  Prior Outpatient Therapy:  yes, [facility]  Alcohol Screening: 1. How often do you have a drink containing alcohol?: 2 to 3 times a week 2. How many drinks containing alcohol do you have on a typical day when you are drinking?: 1 or 2 3. How often do you have six or more drinks on one occasion?: Never Preliminary Score: 0 9. Have you or someone else been injured as a result of your drinking?: No 10. Has a relative or friend or a doctor or another health worker been concerned about your drinking or suggested you cut down?: No (Pt reports no, but per record family is concerned about his drinking.) Alcohol Use Disorder Identification Test Final Score (AUDIT): 3 Brief Intervention: AUDIT score less than 7 or less-screening does not suggest unhealthy drinking-brief intervention not indicated  Allergies:   Allergies  Allergen Reactions  . Bee Venom Anaphylaxis   Lab Results:  Results for orders placed or performed during the hospital encounter of 08/09/15  (from the past 48 hour(s))  Urine rapid drug screen (hosp performed) (Not at [facility])     Status: None   Collection Time: 08/09/15  8:05 PM  Result Value Ref Range   Opiates NONE DETECTED NONE DETECTED   Cocaine NONE DETECTED NONE DETECTED   Benzodiazepines NONE DETECTED NONE DETECTED   Amphetamines NONE DETECTED NONE DETECTED   Tetrahydrocannabinol NONE DETECTED NONE DETECTED   Barbiturates NONE DETECTED NONE DETECTED    Comment:        DRUG SCREEN FOR MEDICAL PURPOSES ONLY.  IF CONFIRMATION IS NEEDED FOR ANY PURPOSE, NOTIFY LAB WITHIN 5 DAYS.        LOWEST DETECTABLE LIMITS FOR URINE DRUG SCREEN Drug Class       Cutoff (ng/mL) Amphetamine      1000 Barbiturate      200 Benzodiazepine   200 Tricyclics       300 Opiates          300 Cocaine          300 THC              50   Comprehensive metabolic panel     Status: Abnormal   Collection Time: 08/09/15  8:35 PM  Result Value Ref Range   Sodium 135 135 - 145 mmol/L   Potassium 3.5 3.5 - 5.1 mmol/L   Chloride 98 (L) 101 - 111 mmol/L   CO2 27 22 - 32 mmol/L   Glucose, Bld 95 65 - 99 mg/dL   BUN 6 6 - 20 mg/dL   Creatinine, Ser 0.93 0.61 - 1.24 mg/dL   Calcium 9.9 8.9 - 10.3 mg/dL   Total Protein 8.6 (H) 6.5 - 8.1 g/dL   Albumin 4.4 3.5 - 5.0 g/dL   AST 54 (H) 15 - 41 U/L   ALT 63 17 - 63 U/L   Alkaline Phosphatase 64 38 - 126 U/L   Total Bilirubin 0.7 0.3 - 1.2 mg/dL   GFR calc non Af Amer >60 >60 mL/min   GFR calc Af Amer >60 >60 mL/min    Comment: (NOTE) The eGFR has been calculated using the CKD EPI equation. This calculation has not been validated in all clinical situations.   eGFR's persistently <60 mL/min signify possible Chronic Kidney Disease.    Anion gap 10 5 - 15  Ethanol (ETOH)     Status: Abnormal   Collection Time: 08/09/15  8:35 PM  Result Value Ref Range   Alcohol, Ethyl (B) 48 (H) <5 mg/dL    Comment:        LOWEST DETECTABLE LIMIT FOR SERUM ALCOHOL IS 5 mg/dL FOR MEDICAL PURPOSES ONLY    Salicylate level     Status: None   Collection Time: 08/09/15  8:35 PM  Result Value Ref Range   Salicylate Lvl <4.0 2.8 - 30.0 mg/dL  Acetaminophen level     Status: Abnormal   Collection Time: 08/09/15  8:35 PM  Result Value Ref Range   Acetaminophen (Tylenol), Serum <10 (L) 10 - 30 ug/mL    Comment:        THERAPEUTIC CONCENTRATIONS VARY SIGNIFICANTLY. A RANGE OF 10-30 ug/mL MAY BE AN EFFECTIVE CONCENTRATION FOR MANY PATIENTS. HOWEVER, SOME ARE BEST TREATED AT CONCENTRATIONS OUTSIDE THIS RANGE. ACETAMINOPHEN CONCENTRATIONS >150 ug/mL AT 4 HOURS AFTER INGESTION AND >50 ug/mL AT 12 HOURS AFTER INGESTION ARE OFTEN ASSOCIATED WITH TOXIC REACTIONS.   CBC     Status: Abnormal   Collection Time: 08/09/15  8:35 PM  Result Value Ref Range   WBC 4.5 4.0 - 10.5 K/uL   RBC 3.85 (L) 4.22 - 5.81 MIL/uL   Hemoglobin 12.4 (L) 13.0 - 17.0 g/dL   HCT 36.6 (L) 39.0 - 52.0 %   MCV 95.1 78.0 - 100.0 fL   MCH 32.2 26.0 - 34.0 pg   MCHC 33.9 30.0 - 36.0 g/dL   RDW 13.2 11.5 - 15.5 %   Platelets 129 (L) 150 - 400 K/uL  Phenytoin level, total     Status: Abnormal   Collection Time: 08/10/15  3:45 AM  Result Value Ref Range   Phenytoin Lvl 20.5 (H) 10.0 - 20.0 ug/mL   Current Medications: Current Facility-Administered Medications  Medication Dose Route Frequency Provider Last Rate Last Dose  . benztropine (COGENTIN) tablet 0.5 mg  0.5 mg Oral QPM  , MD      . docusate sodium (COLACE) capsule 100 mg  100 mg Oral BID Josephine C Onuoha, NP   100 mg at 08/11/15 0948  . famotidine (PEPCID) tablet 10 mg  10 mg Oral QHS  , MD      . haloperidol (HALDOL) tablet 5 mg  5 mg Oral QPM  , MD      . ibuprofen (ADVIL,MOTRIN) tablet 400 mg  400 mg Oral Q6H PRN Josephine C Onuoha, NP      . levETIRAcetam (KEPPRA) tablet 500 mg  500 mg Oral BID  , MD      . LORazepam (ATIVAN) injection 2 mg  2 mg Intramuscular Q4H PRN Terri L Green, RPH      . LORazepam  (ATIVAN) tablet 0-4 mg  0-4 mg Oral 4 times per day Josephine C Onuoha, NP   0 mg at 08/10/15 1800  . phenytoin (DILANTIN) ER capsule 300 mg  300 mg Oral QHS Josephine C Onuoha, NP   300 mg at 08/10/15 2159  . thiamine (VITAMIN B-1) tablet 100 mg  100 mg Oral Daily Josephine C Onuoha, NP   100 mg at 08/11/15 0949  . traZODone (DESYREL) tablet 50 mg  50 mg Oral QHS Josephine C Onuoha, NP   50 mg at 08/10/15 2200   PTA Medications: Prescriptions prior to admission  Medication Sig Dispense   Refill Last Dose  . aspirin EC 325 MG EC tablet Take 1 tablet (325 mg total) by mouth 2 (two) times daily. (Patient not taking: Reported on 05/31/2015) 30 tablet  Not Taking at Unknown time  . diphenhydrAMINE (BENADRYL) 25 MG tablet Take 2 tablets (50 mg total) by mouth every 6 (six) hours as needed for allergies (and swelling). (Patient not taking: Reported on 05/31/2015) 40 tablet 0 Not Taking at Unknown time  . docusate sodium 100 MG CAPS Take 100 mg by mouth 2 (two) times daily. (Patient not taking: Reported on 05/31/2015) 10 capsule  Not Taking at Unknown time  . EPINEPHrine (EPIPEN) 0.3 mg/0.3 mL IJ SOAJ injection Inject 0.3 mLs (0.3 mg total) into the muscle as needed. 2 Device 0 unknown  . ferrous sulfate 325 (65 FE) MG tablet Take 1 tablet (325 mg total) by mouth 3 (three) times daily after meals. (Patient not taking: Reported on 05/31/2015)   Not Taking at Unknown time  . phenytoin (DILANTIN) 100 MG ER capsule Take 3 capsules (300 mg total) by mouth at bedtime. TAKES ONE IN THE AM AND 2 IN THE PM 30 capsule 0 08/08/2015 at Unknown time  . ranitidine (ZANTAC) 150 MG tablet Take 2 tablets (300 mg total) by mouth 2 (two) times daily. (Patient not taking: Reported on 05/31/2015) 20 tablet 0 Not Taking at Unknown time  . traZODone (DESYREL) 50 MG tablet TK 1 TO 2 TS PO QHS  4     Previous Psychotropic Medications: Yes risperidone, zoloft  Substance Abuse History in the last 12 months:  Yes.  alcohol,  tobacco    Consequences of Substance Abuse: Medical Consequences:  admissions Legal Consequences:  is on probation Family Consequences:  relational struggles  Results for orders placed or performed during the hospital encounter of 08/09/15 (from the past 72 hour(s))  Urine rapid drug screen (hosp performed) (Not at [facility])     Status: None   Collection Time: 08/09/15  8:05 PM  Result Value Ref Range   Opiates NONE DETECTED NONE DETECTED   Cocaine NONE DETECTED NONE DETECTED   Benzodiazepines NONE DETECTED NONE DETECTED   Amphetamines NONE DETECTED NONE DETECTED   Tetrahydrocannabinol NONE DETECTED NONE DETECTED   Barbiturates NONE DETECTED NONE DETECTED    Comment:        DRUG SCREEN FOR MEDICAL PURPOSES ONLY.  IF CONFIRMATION IS NEEDED FOR ANY PURPOSE, NOTIFY LAB WITHIN 5 DAYS.        LOWEST DETECTABLE LIMITS FOR URINE DRUG SCREEN Drug Class       Cutoff (ng/mL) Amphetamine      1000 Barbiturate      200 Benzodiazepine   224 Tricyclics       825 Opiates          300 Cocaine          300 THC              50   Comprehensive metabolic panel     Status: Abnormal   Collection Time: 08/09/15  8:35 PM  Result Value Ref Range   Sodium 135 135 - 145 mmol/L   Potassium 3.5 3.5 - 5.1 mmol/L   Chloride 98 (L) 101 - 111 mmol/L   CO2 27 22 - 32 mmol/L   Glucose, Bld 95 65 - 99 mg/dL   BUN 6 6 - 20 mg/dL   Creatinine, Ser 0.93 0.61 - 1.24 mg/dL   Calcium 9.9 8.9 - 10.3 mg/dL   Total Protein 8.6 (  H) 6.5 - 8.1 g/dL   Albumin 4.4 3.5 - 5.0 g/dL   AST 54 (H) 15 - 41 U/L   ALT 63 17 - 63 U/L   Alkaline Phosphatase 64 38 - 126 U/L   Total Bilirubin 0.7 0.3 - 1.2 mg/dL   GFR calc non Af Amer >60 >60 mL/min   GFR calc Af Amer >60 >60 mL/min    Comment: (NOTE) The eGFR has been calculated using the CKD EPI equation. This calculation has not been validated in all clinical situations. eGFR's persistently <60 mL/min signify possible Chronic Kidney Disease.    Anion gap 10 5 - 15   Ethanol (ETOH)     Status: Abnormal   Collection Time: 08/09/15  8:35 PM  Result Value Ref Range   Alcohol, Ethyl (B) 48 (H) <5 mg/dL    Comment:        LOWEST DETECTABLE LIMIT FOR SERUM ALCOHOL IS 5 mg/dL FOR MEDICAL PURPOSES ONLY   Salicylate level     Status: None   Collection Time: 08/09/15  8:35 PM  Result Value Ref Range   Salicylate Lvl <1.5 2.8 - 30.0 mg/dL  Acetaminophen level     Status: Abnormal   Collection Time: 08/09/15  8:35 PM  Result Value Ref Range   Acetaminophen (Tylenol), Serum <10 (L) 10 - 30 ug/mL    Comment:        THERAPEUTIC CONCENTRATIONS VARY SIGNIFICANTLY. A RANGE OF 10-30 ug/mL MAY BE AN EFFECTIVE CONCENTRATION FOR MANY PATIENTS. HOWEVER, SOME ARE BEST TREATED AT CONCENTRATIONS OUTSIDE THIS RANGE. ACETAMINOPHEN CONCENTRATIONS >150 ug/mL AT 4 HOURS AFTER INGESTION AND >50 ug/mL AT 12 HOURS AFTER INGESTION ARE OFTEN ASSOCIATED WITH TOXIC REACTIONS.   CBC     Status: Abnormal   Collection Time: 08/09/15  8:35 PM  Result Value Ref Range   WBC 4.5 4.0 - 10.5 K/uL   RBC 3.85 (L) 4.22 - 5.81 MIL/uL   Hemoglobin 12.4 (L) 13.0 - 17.0 g/dL   HCT 36.6 (L) 39.0 - 52.0 %   MCV 95.1 78.0 - 100.0 fL   MCH 32.2 26.0 - 34.0 pg   MCHC 33.9 30.0 - 36.0 g/dL   RDW 13.2 11.5 - 15.5 %   Platelets 129 (L) 150 - 400 K/uL  Phenytoin level, total     Status: Abnormal   Collection Time: 08/10/15  3:45 AM  Result Value Ref Range   Phenytoin Lvl 20.5 (H) 10.0 - 20.0 ug/mL    Observation Level/Precautions:  Detox 15 minute checks Seizure    Psychotherapy:  Individual and group therapy   Medications:  See below  Consultations:  Education officer, museum- hospitalist  Discharge Concerns:  Stability       Psychological Evaluations: No   Treatment Plan Summary: Daily contact with patient to assess and evaluate symptoms and progress in treatment and Medication management   Patient will benefit from inpatient treatment and stabilization.  Estimated length of stay  is 5-7 days.  Reviewed past medical records,treatment plan.   Will start Haldol 5 mg po qpm for psychosis. Will add Cogentin 0.5 mg po qpm for EPS. Will continue Trazodone 50 mg po qhs for sleep. Continue CIWA protocol/Ativan protocol prn for withdrawal sx.  Provided smoking cessation counseling.   Pt with Phenytoin level elevated at 20.5- 08/10/15- will call hospitalist consult. Start Keppra 500 mg po bid today for seizures- pt reports having several seizures right before coming to ED this admission. Will also make available Ativan 2 mg IM  prn for seizures.  Will continue to monitor vitals ,medication compliance and treatment side effects while patient is here.  Will monitor for medical issues as well as call consult as needed.  Reviewed labs AST- elevated at 54, UDS - neg, CBC - reviewed -Plt -129 ( chronic) BAL -48 ( 08/09/15).  CSW will start working on disposition.  Patient to participate in therapeutic milieu .       Medical Decision Making:  New problem, with additional work up planned, Review of Psycho-Social Stressors (1), Review or order clinical lab tests (1), Established Problem, Worsening (2), Review of Last Therapy Session (1), Review of Medication Regimen & Side Effects (2) and Review of New Medication or Change in Dosage (2)  I certify that inpatient services furnished can reasonably be expected to improve the patient's condition.   , MD 8/17/201612:42 PM  

## 2015-08-11 NOTE — Progress Notes (Signed)
DAR Note: [name] is pleasant and cooperative.  He has spent much of the day in his room.  He attended morning group and briefly went to afternoon group.  He didn't go down to the gym for exercise choosing to stay in his room and rest.  He took his medications without difficulty.  CIWA's 0 through out the day and no prns required.  He denies any SI/HI or A/V hallucinations. He reports that he bit the inside of his lip last week after having a seizure and is requesting something that will help with his pain.  He declined ibuprofen or ice/heat pack.  Spoke with Dr. Elna Breslow an order for Orajel obtained for mouth pain.  Q 15 minute checks maintained for safety and seizure precautions.

## 2015-08-11 NOTE — [facility] Group Notes (Signed)
[facility] Group Therapy  08/11/2015 , 1:39 PM    Type of Therapy:  [facility] Presentation  Participation Level:  Active  Participation Quality:  Attentive  Affect:  Blunted  Cognitive:  Oriented  Insight:  Limited  Engagement in Therapy:  Engaged  Modes of Intervention:  Discussion, Education and Socialization  Summary of Progress/Problems:  Onalee Hua from [facility] came to present his recovery story and play the guitar.  Stayed the entire time.  Attentive throughout.  Daryel Gerald B 08/11/2015 , 1:39 PM
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
## 2015-08-11 NOTE — Progress Notes (Signed)
Adult Psychoeducational Group Note  Date:  08/11/2015 Time:  9:36 PM  Group Topic/Focus:  Wrap-Up Group:   The focus of this group is to help patients review their daily goal of treatment and discuss progress on daily workbooks.  Participation Level:  Active  Participation Quality:  Appropriate  Affect:  Appropriate  Cognitive:  Appropriate  Insight: Appropriate  Engagement in Group:  Engaged  Modes of Intervention:  Discussion  Additional Comments: The patient expressed that she attended music therapy.The patient also said that group was OK.  Octavio Manns 08/11/2015, 9:36 PM

## 2015-08-12 LAB — LIPID PANEL
Cholesterol: 180 mg/dL (ref 0–200)
HDL: 90 mg/dL (ref >40)
LDL CALC: 67 mg/dL (ref 0–99)
TRIGLYCERIDES: 113 mg/dL (ref <150)
Total CHOL/HDL Ratio: 2 RATIO
VLDL: 23 mg/dL (ref 0–40)

## 2015-08-12 LAB — TSH: TSH: 2.179 u[IU]/mL (ref 0.350–4.500)

## 2015-08-12 LAB — PHENYTOIN LEVEL, TOTAL: Phenytoin Lvl: 16 ug/mL (ref 10.0–20.0)

## 2015-08-12 MED ORDER — BENZTROPINE MESYLATE 0.5 MG PO TABS
0.5000 mg | ORAL_TABLET | Freq: Two times a day (BID) | ORAL | Status: DC
  Administered 2015-08-12 – 2015-08-13 (×3): 0.5 mg via ORAL
  Filled 2015-08-12 (×6): qty 1

## 2015-08-12 MED ORDER — HALOPERIDOL 5 MG PO TABS
5.0000 mg | ORAL_TABLET | Freq: Two times a day (BID) | ORAL | Status: DC
  Administered 2015-08-12 – 2015-08-13 (×2): 5 mg via ORAL
  Filled 2015-08-12 (×4): qty 1

## 2015-08-12 NOTE — Progress Notes (Signed)
D  Pt is pleasant and cooperative   He tends to isolate to his room and has little interaction with others   He did attend group this evening and was appropriate   He has been more social this evening and said karaoke group was ok A   Verbal support given   Medications administered and effectiveness monitored   Q 15 min checks R   Pt safe at present
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
## 2015-08-12 NOTE — Progress Notes (Signed)
Patient ID: [name], male   DOB: 11/30/1976, 39 y.o.   MRN: [id number]  Pt currently presents with a flat affect and guarded behavior. Pt remains in bed for most of the am, when approached by writer, pt states "I don't like going outside, I don't even do it at home." Pt affect is inconsistent with thought.   Pt provided with medications per providers orders. Pt's labs and vitals were monitored throughout the day. Pt supported emotionally and encouraged to express concerns and questions. Pt educated on medications. Pt given 1:1, pt encouraged to attend groups and be present in the milieu.   Pt's safety ensured with 15 minute and environmental checks. Pt currently denies SI/HI and A/V hallucinations. Pt verbally agrees to seek staff if SI/HI or A/VH occurs and to consult with staff before acting on these thoughts. Pt remains in the milieu for most of the afternoon. Pt seen laughing with other pts in the dayroom. Will continue POC.

## 2015-08-12 NOTE — Progress Notes (Signed)
Patient ID: [name], male   DOB: Apr 26, 1976, 39 y.o.   MRN: [id number] Adult Psychoeducational Group Note  Date:  08/12/2015 Time: 09:15 am  Group Topic/Focus:  Goals Group:   The focus of this group is to help patients establish daily goals to achieve during treatment and discuss how the patient can incorporate goal setting into their daily lives to aide in recovery.  Participation Level:  Did Not Attend  Participation Quality:  n/a  Affect: n/a  Cognitive: n/a  Insight: n/a  Engagement in Group:  n/a  Modes of Intervention:  Activity, Education, Orientation and Support  Additional Comments:  Pt did not attend. Pt in bed asleep.   Aurora Mask 08/12/2015, 12:47 PM

## 2015-08-12 NOTE — Progress Notes (Signed)
[facility] MD Progress Note  08/12/2015 2:31 PM Kalel Harty  MRN:  [id number] Subjective: Patient states " I am OK.'  Objective: Patient seen and chart reviewed.Discussed patient with treatment team. Tavyn Kurka is a 39 y.o. AA male with a PMHx of prior substance abuse, AVN L hip s/p arthroplasty, schizophrenia, and seizures, who presented to the ED escorted by [employer] with IVC paperwork stating that "respondent has been diagnosed with schizophrenia and was prescribed several medications which he does not take regularly.  Pt on the unit seen as internally preoccupied , laughing inappropriately from time to time - likely responding to internal stimuli. Pt reports his AH as reduced. Pt otherwise withdrawn mostly , needs a lot of encouragement to participate in therapeutic milieu. Pt's phenytoin level repeated yday - is wnl at 16.  Pt denies any new concerns. Staff reports compliance on medications.   Principal Problem: Paranoid schizophrenia Diagnosis:   Patient Active Problem List   Diagnosis Date Noted  . Paranoid schizophrenia [F20.0] 08/11/2015  . Seizure disorder [G40.909] 08/10/2015  . Alcohol use disorder, severe, dependence [F10.20] 08/10/2015  . Hx of schizophrenia [Z86.59]   . Expected blood loss anemia [D50.0] 04/30/2013  . Overweight (BMI 25.0-29.9) [E66.3] 04/30/2013  . S/P left THA, AA [Z96.60] 04/29/2013   Total Time spent with patient: 25 minutes   Past Medical History:  Past Medical History  Diagnosis Date  . Substance abuse     has not used for 5-6 yrs  . Avascular necrosis of hip     left  . Schizophrenia   . Seizures     HX [place] seiaures - last seizure 6-7 months ago  . Avascular necrosis     L HIP  . Arthritis     hands    Past Surgical History  Procedure Laterality Date  . No past surgeries    . Total hip arthroplasty Left 04/29/2013    Procedure: TOTAL HIP ARTHROPLASTY ANTERIOR APPROACH;  Surgeon: Shelda Pal, MD;  Location: [facility];  Service:  Orthopedics;  Laterality: Left;   Family History:  Family History  Problem Relation Age of Onset  . Cancer Brother   . Diabetes Mother   . Hypertension Mother   . Cancer Father   . Hypertension Father   . Hypertension Sister   . Diabetes Maternal Grandmother    Social History:  History  Alcohol Use  . 3.6 oz/week  . 6 Cans of beer per week    Comment: Pt. states that he drinks beer every couple of days 2 (12 oz cans)     History  Drug Use No    Social History   Social History  . Marital Status: Single    Spouse Name: N/A  . Number of Children: N/A  . Years of Education: N/A   Social History Main Topics  . Smoking status: Current Every Day Smoker -- 1.50 packs/day for 5 years    Types: Cigarettes  . Smokeless tobacco: Never Used  . Alcohol Use: 3.6 oz/week    6 Cans of beer per week     Comment: Pt. states that he drinks beer every couple of days 2 (12 oz cans)  . Drug Use: No  . Sexual Activity: Yes    Birth Control/ Protection: Condom   Other Topics Concern  . None   Social History Narrative   Additional History:    Sleep: Fair  Appetite:  Fair    Musculoskeletal: Strength & Muscle Tone: within normal  limits Gait & Station: normal Patient leans: N/A   Psychiatric Specialty Exam: Physical Exam  Review of Systems  Psychiatric/Behavioral: Positive for hallucinations and substance abuse. The patient is nervous/anxious.   All other systems reviewed and are negative.   Blood pressure 102/76, pulse 84, temperature 98.7 F (37.1 C), temperature source Oral, resp. rate 16, height 5' 4.5" (1.638 m), weight 68.04 kg (150 lb), SpO2 100 %.Body mass index is 25.36 kg/(m^2).  General Appearance: Disheveled  Eye Contact::  Minimal  Speech:  Normal Rate  Volume:  Decreased  Mood:  Dysphoric  Affect:  Inappropriate and Labile  Thought Process:  Disorganized  Orientation:  Other:  to person, place  Thought Content:  Hallucinations: Auditory  Suicidal  Thoughts:  No  Homicidal Thoughts:  No  Memory:  Immediate;   Fair Recent;   Fair Remote;   Fair  Judgement:  Impaired  Insight:  Fair  Psychomotor Activity:  Normal  Concentration:  Poor  Recall:  [employer] of Knowledge:Fair  Language: Fair  Akathisia:  No  Handed:  Right  AIMS (if indicated):     Assets:  Social Support  ADL's:  Intact  Cognition: WNL  Sleep:  Number of Hours: 6.5     Current Medications: Current Facility-Administered Medications  Medication Dose Route Frequency Provider Last Rate Last Dose  . benzocaine (ORAJEL) 10 % mucosal gel   Mouth/Throat Q4H PRN Jomarie Longs, MD      . benztropine (COGENTIN) tablet 0.5 mg  0.5 mg Oral BID Jomarie Longs, MD      . docusate sodium (COLACE) capsule 100 mg  100 mg Oral BID Earney Navy, NP   100 mg at 08/12/15 0820  . famotidine (PEPCID) tablet 10 mg  10 mg Oral QHS Jomarie Longs, MD   10 mg at 08/11/15 2158  . haloperidol (HALDOL) tablet 5 mg  5 mg Oral BID Jomarie Longs, MD      . ibuprofen (ADVIL,MOTRIN) tablet 400 mg  400 mg Oral Q6H PRN Earney Navy, NP      . levETIRAcetam (KEPPRA) tablet 500 mg  500 mg Oral BID Jomarie Longs, MD   500 mg at 08/12/15 0820  . LORazepam (ATIVAN) injection 2 mg  2 mg Intramuscular Q4H PRN Otho Bellows, RPH      . phenytoin (DILANTIN) ER capsule 300 mg  300 mg Oral QHS Earney Navy, NP   300 mg at 08/11/15 2158  . thiamine (VITAMIN B-1) tablet 100 mg  100 mg Oral Daily Earney Navy, NP   100 mg at 08/12/15 0820  . traZODone (DESYREL) tablet 50 mg  50 mg Oral QHS Earney Navy, NP   50 mg at 08/11/15 2158    Lab Results:  Results for orders placed or performed during the hospital encounter of 08/10/15 (from the past 48 hour(s))  Phenytoin level, total     Status: None   Collection Time: 08/12/15  6:29 AM  Result Value Ref Range   Phenytoin Lvl 16.0 10.0 - 20.0 ug/mL    Comment: Performed at [facility]  TSH     Status: None    Collection Time: 08/12/15  6:29 AM  Result Value Ref Range   TSH 2.179 0.350 - 4.500 uIU/mL    Comment: Performed at [facility]  Lipid panel     Status: None   Collection Time: 08/12/15  6:29 AM  Result Value Ref Range   Cholesterol 180  0 - 200 mg/dL   Triglycerides 161 <096 mg/dL   HDL 90 >04 mg/dL   Total CHOL/HDL Ratio 2.0 RATIO   VLDL 23 0 - 40 mg/dL   LDL Cholesterol 67 0 - 99 mg/dL    Comment:        Total Cholesterol/HDL:CHD Risk Coronary Heart Disease Risk Table                     Men   Women  1/2 Average Risk   3.4   3.3  Average Risk       5.0   4.4  2 X Average Risk   9.6   7.1  3 X Average Risk  23.4   11.0        Use the calculated Patient Ratio above and the CHD Risk Table to determine the patient's CHD Risk.        ATP III CLASSIFICATION (LDL):  <100     mg/dL   Optimal  540-981  mg/dL   Near or Above                    Optimal  130-159  mg/dL   Borderline  191-478  mg/dL   High  >295     mg/dL   Very High Performed at [facility]     Physical Findings: AIMS: Facial and Oral Movements Muscles of Facial Expression: None, normal Lips and Perioral Area: None, normal Jaw: None, normal Tongue: None, normal,Extremity Movements Upper (arms, wrists, hands, fingers): None, normal Lower (legs, knees, ankles, toes): None, normal, Trunk Movements Neck, shoulders, hips: None, normal, Overall Severity Severity of abnormal movements (highest score from questions above): None, normal Incapacitation due to abnormal movements: None, normal Patient's awareness of abnormal movements (rate only patient's report): No Awareness, Dental Status Current problems with teeth and/or dentures?: No Does patient usually wear dentures?: No  CIWA:  CIWA-Ar Total: 0 COWS:     Assessment: Rehaan Viloria is a 39 y.o. AA male with a PMHx of prior substance abuse, AVN L hip s/p arthroplasty, schizophrenia, and seizures, who presented to the ED escorted by [employer]  with IVC paperwork stating that "respondent has been diagnosed with schizophrenia and was prescribed several medications which he does not take regularly. Pt continues to be inappropriate , internally preoccupied and psychotic - will need medications to be readjusted.    Treatment Plan Summary: Daily contact with patient to assess and evaluate symptoms and progress in treatment and Medication management Will increase Haldol to 5 mg po bid for psychosis. Will add Cogentin 0.5 mg po bid  for EPS. Will continue Trazodone 50 mg po qhs for sleep. Continue CIWA protocol/Ativan protocol prn for withdrawal sx.   Pt with Phenytoin level elevated at 20.5- 08/10/15- per neurology and pharmacy recommendations as per NP Shiela - Phenytoin continued since it is just above therapeutic level. Phenytoin level repeat today - 16. Normal.  Continue Keppra 500 mg po bid today for seizures- pt reports having several seizures right before coming to ED this admission. Will also make available Ativan 2 mg IM prn for seizures.  Will continue to monitor vitals ,medication compliance and treatment side effects while patient is here.  Will monitor for medical issues as well as call consult as needed.  CSW will start working on disposition.  Patient to participate in therapeutic milieu .   Medical Decision Making:  Review of Psycho-Social Stressors (1), Review or order clinical lab tests (  1), Review of Last Therapy Session (1), Review of Medication Regimen & Side Effects (2) and Review of New Medication or Change in Dosage (2)     Drey Shaff MD 08/12/2015, 2:31 PM

## 2015-08-12 NOTE — Plan of Care (Signed)
Problem: Ineffective individual coping Goal: STG: Patient will remain free from self harm Outcome: Progressing Pt remains safe on the unit with 15 minute checks

## 2015-08-13 LAB — HEMOGLOBIN A1C
HEMOGLOBIN A1C: 5.8 % — AB (ref 4.8–5.6)
Mean Plasma Glucose: 120 mg/dL

## 2015-08-13 LAB — PROLACTIN: PROLACTIN: 22 ng/mL — AB (ref 4.0–15.2)

## 2015-08-13 MED ORDER — BENZTROPINE MESYLATE 1 MG PO TABS
1.0000 mg | ORAL_TABLET | Freq: Two times a day (BID) | ORAL | Status: DC
  Administered 2015-08-14 – 2015-08-16 (×5): 1 mg via ORAL
  Filled 2015-08-13 (×8): qty 1

## 2015-08-13 MED ORDER — PHENYTOIN SODIUM EXTENDED 100 MG PO CAPS
100.0000 mg | ORAL_CAPSULE | Freq: Every day | ORAL | Status: DC
  Administered 2015-08-13 – 2015-08-16 (×4): 100 mg via ORAL
  Filled 2015-08-13 (×6): qty 1

## 2015-08-13 MED ORDER — PHENYTOIN SODIUM EXTENDED 100 MG PO CAPS
200.0000 mg | ORAL_CAPSULE | Freq: Every day | ORAL | Status: DC
  Administered 2015-08-13 – 2015-08-15 (×3): 200 mg via ORAL
  Filled 2015-08-13 (×4): qty 2

## 2015-08-13 MED ORDER — HALOPERIDOL 5 MG PO TABS
5.0000 mg | ORAL_TABLET | Freq: Every day | ORAL | Status: DC
  Administered 2015-08-14 – 2015-08-16 (×3): 5 mg via ORAL
  Filled 2015-08-13 (×4): qty 1

## 2015-08-13 MED ORDER — BENZTROPINE MESYLATE 0.5 MG PO TABS: 0.5000 mg | ORAL_TABLET | Freq: Once | ORAL | Status: DC

## 2015-08-13 MED ORDER — HALOPERIDOL 5 MG PO TABS
7.5000 mg | ORAL_TABLET | Freq: Every evening | ORAL | Status: DC
  Administered 2015-08-13 – 2015-08-15 (×3): 7.5 mg via ORAL
  Filled 2015-08-13 (×4): qty 1

## 2015-08-13 MED ORDER — BENZTROPINE MESYLATE 1 MG PO TABS
1.0000 mg | ORAL_TABLET | Freq: Once | ORAL | Status: AC
  Administered 2015-08-13: 1 mg via ORAL
  Filled 2015-08-13 (×2): qty 1

## 2015-08-13 NOTE — Progress Notes (Signed)
[facility] MD Progress Note  08/13/2015 12:23 PM Kaidyn Javid  MRN:  [id number] Subjective: Patient states " I am fine " and smiles inappropriately.   Objective: Patient seen and chart reviewed.Discussed patient with treatment team. Tan Clopper is a 39 y.o. AA male with a PMHx of prior substance abuse, AVN L hip s/p arthroplasty, schizophrenia, and seizures, who presented to the ED escorted by [employer] with IVC paperwork stating that "respondent has been diagnosed with schizophrenia and was prescribed several medications which he does not take regularly.  Pt continues to be disorganized - is delayed , smiles to self and to random people on the hallway inappropriately. Pt continues to have lack of insight in to his illness. He is a very limited historian - talks in monosyllables. Per sister according to CSW - pt was confused and not responding to questions asked , neither was taking any medications prior to admission. Pt also abuses alcohol and has periods of mood lability and agitation. Pt on the unit per staff has been observed as slow , inappropriate , internally preoccupied , laughs inappropriately , but no disruptive issues noted.     Principal Problem: Paranoid schizophrenia Diagnosis:   Patient Active Problem List   Diagnosis Date Noted  . Paranoid schizophrenia [F20.0] 08/11/2015  . Seizure disorder [G40.909] 08/10/2015  . Alcohol use disorder, severe, dependence [F10.20] 08/10/2015  . Hx of schizophrenia [Z86.59]   . Expected blood loss anemia [D50.0] 04/30/2013  . Overweight (BMI 25.0-29.9) [E66.3] 04/30/2013  . S/P left THA, AA [Z96.60] 04/29/2013   Total Time spent with patient: 25 minutes   Past Medical History:  Past Medical History  Diagnosis Date  . Substance abuse     has not used for 5-6 yrs  . Avascular necrosis of hip     left  . Schizophrenia   . Seizures     HX [place] seiaures - last seizure 6-7 months ago  . Avascular necrosis     L HIP  . Arthritis     hands     Past Surgical History  Procedure Laterality Date  . No past surgeries    . Total hip arthroplasty Left 04/29/2013    Procedure: TOTAL HIP ARTHROPLASTY ANTERIOR APPROACH;  Surgeon: Shelda Pal, MD;  Location: [facility];  Service: Orthopedics;  Laterality: Left;   Family History:  Family History  Problem Relation Age of Onset  . Cancer Brother   . Diabetes Mother   . Hypertension Mother   . Cancer Father   . Hypertension Father   . Hypertension Sister   . Diabetes Maternal Grandmother    Social History:  History  Alcohol Use  . 3.6 oz/week  . 6 Cans of beer per week    Comment: Pt. states that he drinks beer every couple of days 2 (12 oz cans)     History  Drug Use No    Social History   Social History  . Marital Status: Single    Spouse Name: N/A  . Number of Children: N/A  . Years of Education: N/A   Social History Main Topics  . Smoking status: Current Every Day Smoker -- 1.50 packs/day for 5 years    Types: Cigarettes  . Smokeless tobacco: Never Used  . Alcohol Use: 3.6 oz/week    6 Cans of beer per week     Comment: Pt. states that he drinks beer every couple of days 2 (12 oz cans)  . Drug Use: No  .  Sexual Activity: Yes    Birth Control/ Protection: Condom   Other Topics Concern  . None   Social History Narrative   Additional History:    Sleep: Fair  Appetite:  Fair    Musculoskeletal: Strength & Muscle Tone: within normal limits Gait & Station: normal Patient leans: N/A   Psychiatric Specialty Exam: Physical Exam  Review of Systems  Psychiatric/Behavioral: Positive for hallucinations and substance abuse. The patient is nervous/anxious.   All other systems reviewed and are negative.   Blood pressure 100/59, pulse 101, temperature 98.1 F (36.7 C), temperature source Oral, resp. rate 20, height 5' 4.5" (1.638 m), weight 68.04 kg (150 lb), SpO2 100 %.Body mass index is 25.36 kg/(m^2).  General Appearance: Disheveled  Eye Contact::   Minimal  Speech:  Normal Rate  Volume:  Decreased  Mood:  Dysphoric  Affect:  Inappropriate and Labile  Thought Process:  Disorganized  Orientation:  Other:  to person, place  Thought Content:  Hallucinations: Auditory seen as responding  Suicidal Thoughts:  No  Homicidal Thoughts:  No  Memory:  Immediate;   Fair Recent;   Fair Remote;   Fair  Judgement:  Impaired  Insight:  Fair  Psychomotor Activity:  Normal  Concentration:  Poor  Recall:  [employer] of Knowledge:Fair  Language: Fair  Akathisia:  No  Handed:  Right  AIMS (if indicated):     Assets:  Social Support  ADL's:  Intact  Cognition: WNL  Sleep:  Number of Hours: 5.5     Current Medications: Current Facility-Administered Medications  Medication Dose Route Frequency Provider Last Rate Last Dose  . benzocaine (ORAJEL) 10 % mucosal gel   Mouth/Throat Q4H PRN Jomarie Longs, MD      . benztropine (COGENTIN) tablet 0.5 mg  0.5 mg Oral BID Jomarie Longs, MD   0.5 mg at 08/13/15 0824  . docusate sodium (COLACE) capsule 100 mg  100 mg Oral BID Earney Navy, NP   100 mg at 08/13/15 0824  . famotidine (PEPCID) tablet 10 mg  10 mg Oral QHS Jomarie Longs, MD   10 mg at 08/12/15 2230  . [START ON 08/14/2015] haloperidol (HALDOL) tablet 5 mg  5 mg Oral Q breakfast Cyrstal Leitz, MD      . haloperidol (HALDOL) tablet 7.5 mg  7.5 mg Oral QPM Martha Ellerby, MD      . ibuprofen (ADVIL,MOTRIN) tablet 400 mg  400 mg Oral Q6H PRN Earney Navy, NP      . levETIRAcetam (KEPPRA) tablet 500 mg  500 mg Oral BID Jomarie Longs, MD   500 mg at 08/13/15 0824  . LORazepam (ATIVAN) injection 2 mg  2 mg Intramuscular Q4H PRN Otho Bellows, RPH      . phenytoin (DILANTIN) ER capsule 300 mg  300 mg Oral QHS Earney Navy, NP   300 mg at 08/12/15 2230  . thiamine (VITAMIN B-1) tablet 100 mg  100 mg Oral Daily Earney Navy, NP   100 mg at 08/13/15 0824  . traZODone (DESYREL) tablet 50 mg  50 mg Oral QHS Earney Navy,  NP   50 mg at 08/12/15 2230    Lab Results:  Results for orders placed or performed during the hospital encounter of 08/10/15 (from the past 48 hour(s))  Phenytoin level, total     Status: None   Collection Time: 08/12/15  6:29 AM  Result Value Ref Range   Phenytoin Lvl 16.0 10.0 - 20.0 ug/mL  Comment: Performed at [facility]  TSH     Status: None   Collection Time: 08/12/15  6:29 AM  Result Value Ref Range   TSH 2.179 0.350 - 4.500 uIU/mL    Comment: Performed at [facility]  Lipid panel     Status: None   Collection Time: 08/12/15  6:29 AM  Result Value Ref Range   Cholesterol 180 0 - 200 mg/dL   Triglycerides 161 <096 mg/dL   HDL 90 >04 mg/dL   Total CHOL/HDL Ratio 2.0 RATIO   VLDL 23 0 - 40 mg/dL   LDL Cholesterol 67 0 - 99 mg/dL    Comment:        Total Cholesterol/HDL:CHD Risk Coronary Heart Disease Risk Table                     Men   Women  1/2 Average Risk   3.4   3.3  Average Risk       5.0   4.4  2 X Average Risk   9.6   7.1  3 X Average Risk  23.4   11.0        Use the calculated Patient Ratio above and the CHD Risk Table to determine the patient's CHD Risk.        ATP III CLASSIFICATION (LDL):  <100     mg/dL   Optimal  540-981  mg/dL   Near or Above                    Optimal  130-159  mg/dL   Borderline  191-478  mg/dL   High  >295     mg/dL   Very High Performed at [facility]   Hemoglobin A1c     Status: Abnormal   Collection Time: 08/12/15  6:29 AM  Result Value Ref Range   Hgb A1c MFr Bld 5.8 (H) 4.8 - 5.6 %    Comment: (NOTE)         Pre-diabetes: 5.7 - 6.4         Diabetes: >6.4         Glycemic control for adults with diabetes: <7.0    Mean Plasma Glucose 120 mg/dL    Comment: (NOTE) Performed At: [facility] [address] [place], [place] [id number] Mila Homer MD QI:[id number] Performed at [facility]   Prolactin     Status: Abnormal   Collection  Time: 08/12/15  6:29 AM  Result Value Ref Range   Prolactin 22.0 (H) 4.0 - 15.2 ng/mL    Comment: (NOTE) Performed At: [facility] [address] [place], [place] [id number] Mila Homer MD OZ:[id number] Performed at [facility]     Physical Findings: AIMS: Facial and Oral Movements Muscles of Facial Expression: None, normal Lips and Perioral Area: None, normal Jaw: None, normal Tongue: None, normal,Extremity Movements Upper (arms, wrists, hands, fingers): None, normal Lower (legs, knees, ankles, toes): None, normal, Trunk Movements Neck, shoulders, hips: None, normal, Overall Severity Severity of abnormal movements (highest score from questions above): None, normal Incapacitation due to abnormal movements: None, normal Patient's awareness of abnormal movements (rate only patient's report): No Awareness, Dental Status Current problems with teeth and/or dentures?: No Does patient usually wear dentures?: No  CIWA:  CIWA-Ar Total: 0 COWS:     Assessment: Tayron Hunnell is a 39 y.o. AA male with a PMHx of prior substance abuse, AVN L  hip s/p arthroplasty, schizophrenia, and seizures, who presented to the ED escorted by [employer] with IVC paperwork stating that "respondent has been diagnosed with schizophrenia and was prescribed several medications which he does not take regularly. Pt continues to be inappropriate , internally preoccupied and psychotic - will need medications to be readjusted.    Treatment Plan Summary: Daily contact with patient to assess and evaluate symptoms and progress in treatment and Medication management Will increase Haldol to 5 mg po daily and 7.5 mg po qpm for psychosis. Will provide Haldol decanoate IM prior to DC. Pt has agreed to it. Will continue Cogentin 0.5 mg po bid  for EPS. Will continue Trazodone 50 mg po qhs for sleep. Continue CIWA protocol/Ativan protocol prn for withdrawal sx.   Pt with Phenytoin level elevated at  20.5- 08/10/15- per neurology and pharmacy recommendations as per NP Shiela - Phenytoin continued since it is just above therapeutic level. Phenytoin level repeat  - 16. Normal.  Continue Keppra 500 mg po bid today for seizures- pt reports having several seizures right before coming to ED this admission. Will also make available Ativan 2 mg IM prn for seizures.  Will continue to monitor vitals ,medication compliance and treatment side effects while patient is here.  Will monitor for medical issues as well as call consult as needed.  CSW will start working on disposition.  Patient to participate in therapeutic milieu .   Medical Decision Making:  Review of Psycho-Social Stressors (1), Review or order clinical lab tests (1), Review of Last Therapy Session (1), Review of Medication Regimen & Side Effects (2) and Review of New Medication or Change in Dosage (2)     Braison Snoke MD 08/13/2015, 12:23 PM

## 2015-08-13 NOTE — [facility] Group Notes (Signed)
[facility] LCSW Group Therapy  08/13/2015  1:05 PM  Type of Therapy:  Group therapy  Participation Level:  Active  Participation Quality:  Attentive  Affect:  Flat  Cognitive:  Oriented  Insight:  Limited  Engagement in Therapy:  Limited  Modes of Intervention:  Discussion, Socialization  Summary of Progress/Problems:  Chaplain was here to lead a group on themes of hope and courage. "Hope is what is already been said.  You wrote it all on the board."  Had nothing else to say.  A bit later, out of the blue talked about how much he appreciated being given some papers last today by a peer, and another peer talked about his appreciation for Keigan sharing some crackers with him.  Got the group talking about appreciation for each other. Daryel Gerald B 08/13/2015 1:45 PM

## 2015-08-13 NOTE — Progress Notes (Addendum)
Patient ID: [name], male   DOB: February 25, 1976, 39 y.o.   MRN: [id number]   Pt currently presents with a animated affect and ambivalent behavior. Pt states "I don't know why everyone asks me the same thing over and over again, I'm fine." Pt reports that his probation "ended today" with a smile. Pt has been attending groups. Pt forwards little.   Pt provided with medications per providers orders. Pt's labs and vitals were monitored throughout the day. Pt supported emotionally and encouraged to express concerns and questions. Pt educated on medications. Pt encouraged to make healthy choices in the cafeteria.   Pt's safety ensured with 15 minute and environmental checks. Pt currently denies SI/HI and A/V hallucinations. Pt verbally agrees to seek staff if SI/HI or A/VH occurs and to consult with staff before acting on these thoughts. Will continue POC.

## 2015-08-13 NOTE — [facility] Group Notes (Signed)
[facility] LCSW Group Therapy  08/13/2015  1:05 PM  Type of Therapy:  Group therapy  Participation Level:  Active  Participation Quality:  Attentive  Affect:  Flat  Cognitive:  Oriented  Insight:  Limited  Engagement in Therapy:  Limited  Modes of Intervention:  Discussion, Socialization  Summary of Progress/Problems:  Chaplain was here to lead a group on themes of hope and courage. "Hope is all those things on the board that have already been said."  Declined to say anymore. Daryel Gerald B 08/13/2015 1:58 PM

## 2015-08-13 NOTE — Progress Notes (Signed)
D  Pt is pleasant and cooperative   He has been more interactive but said he wanted to go to bed early because there was nothing to watch on TV   He did attend group this evening and was appropriate   He has been more social this evening  A   Verbal support given   Medications administered and effectiveness monitored   Q 15 min checks R   Pt safe at present

## 2015-08-13 NOTE — [facility] Suicide Risk Assessment (Signed)
[facility] INPATIENT:  Family/Significant Other Suicide Prevention Education  Suicide Prevention Education:  Education Completed; Elayne Guerin, sister, [phone] has been identified by the patient as the family member/significant other with whom the patient will be residing, and identified as the person(s) who will aid the patient in the event of a mental health crisis (suicidal ideations/suicide attempt).  With written consent from the patient, the family member/significant other has been provided the following suicide prevention education, prior to the and/or following the discharge of the patient.  The suicide prevention education provided includes the following:  Suicide risk factors  Suicide prevention and interventions  National Suicide Hotline telephone number  [facility] assessment telephone number  [facility] Emergency Assistance 911  [facility] and/or Residential Mobile Crisis Unit telephone number  Request made of family/significant other to:  Remove weapons (e.g., guns, rifles, knives), all items previously/currently identified as safety concern.    Remove drugs/medications (over-the-counter, prescriptions, illicit drugs), all items previously/currently identified as a safety concern.  The family member/significant other verbalizes understanding of the suicide prevention education information provided.  The family member/significant other agrees to remove the items of safety concern listed above.  Daryel Gerald B 08/13/2015, 3:38 PM

## 2015-08-13 NOTE — Progress Notes (Signed)
[facility] Group Notes:  (Nursing/MHT/Case Management/Adjunct)  Date:  08/13/2015  Time:  9:12 PM  Type of Therapy:  Psychoeducational Skills  Participation Level:  Minimal  Participation Quality:  Attentive  Affect:  Flat  Cognitive:  Appropriate  Insight:  Limited  Engagement in Group:  Limited  Modes of Intervention:  Education  Summary of Progress/Problems: The patient shared with the group that he had a good visit with his sister and had little else to share. As for the theme of the day, his relapse prevention will include spending time with his kids.   Hazle Coca S 08/13/2015, 9:12 PM

## 2015-08-14 NOTE — Progress Notes (Signed)
[facility] Group Notes:  (Nursing/MHT/Case Management/Adjunct)  Date:  08/14/2015  Time:  8:45 PM  Type of Therapy:  Psychoeducational Skills  Participation Level:  Minimal  Participation Quality:  Attentive  Affect:  Depressed and Flat  Cognitive:  Lacking  Insight:  Limited  Engagement in Group:  Resistant  Modes of Intervention:  Education  Summary of Progress/Problems: Patient states that he had an "okay" but would not expound any further.   Hazle Coca S 08/14/2015, 8:45 PM

## 2015-08-14 NOTE — [facility] Group Notes (Signed)
[facility] LCSW Group Therapy  08/14/2015   11:15 AM  Type of Therapy:  Group Therapy  Participation Level:  Did Not Attend although encouraged by MHT and CSW  [name] 08/14/2015

## 2015-08-14 NOTE — Progress Notes (Signed)
[facility] MD Progress Note  08/14/2015 3:41 PM [name]  MRN:  [id number] Subjective: Patient states " I had a bad side effect yesterday . My jaw was locked up.'    Objective: Patient seen and chart reviewed.Discussed patient with treatment team. [name] is a 39 y.o. AA male with a PMHx of prior substance abuse, AVN L hip s/p arthroplasty, schizophrenia, and seizures, who presented to the ED escorted by [employer] with IVC paperwork stating that "respondent has been diagnosed with schizophrenia and was prescribed several medications which he does not take regularly.  Pt with some improvement - not seen as responding to internal stimuli. Pt per staff had EPS to haldol which resolved with cogentin dose  Increase. Pt currently denies any EPS sx. Pt encouraged to stay away from alcohol and stay on medications. Staff notes - no disruptive issues on the unit.   Principal Problem: Paranoid schizophrenia Diagnosis:   Patient Active Problem List   Diagnosis Date Noted  . Paranoid schizophrenia [F20.0] 08/11/2015  . Seizure disorder [G40.909] 08/10/2015  . Alcohol use disorder, severe, dependence [F10.20] 08/10/2015  . Hx of schizophrenia [Z86.59]   . Expected blood loss anemia [D50.0] 04/30/2013  . Overweight (BMI 25.0-29.9) [E66.3] 04/30/2013  . S/P left THA, AA [Z96.60] 04/29/2013   Total Time spent with patient: 25 minutes   Past Medical History:  Past Medical History  Diagnosis Date  . Substance abuse     has not used for 5-6 yrs  . Avascular necrosis of hip     left  . Schizophrenia   . Seizures     HX [place] seiaures - last seizure 6-7 months ago  . Avascular necrosis     L HIP  . Arthritis     hands    Past Surgical History  Procedure Laterality Date  . No past surgeries    . Total hip arthroplasty Left 04/29/2013    Procedure: TOTAL HIP ARTHROPLASTY ANTERIOR APPROACH;  Surgeon: Shelda Pal, MD;  Location: [facility];  Service: Orthopedics;  Laterality: Left;   Family  History:  Family History  Problem Relation Age of Onset  . Cancer Brother   . Diabetes Mother   . Hypertension Mother   . Cancer Father   . Hypertension Father   . Hypertension Sister   . Diabetes Maternal Grandmother    Social History:  History  Alcohol Use  . 3.6 oz/week  . 6 Cans of beer per week    Comment: Pt. states that he drinks beer every couple of days 2 (12 oz cans)     History  Drug Use No    Social History   Social History  . Marital Status: Single    Spouse Name: N/A  . Number of Children: N/A  . Years of Education: N/A   Social History Main Topics  . Smoking status: Current Every Day Smoker -- 1.50 packs/day for 5 years    Types: Cigarettes  . Smokeless tobacco: Never Used  . Alcohol Use: 3.6 oz/week    6 Cans of beer per week     Comment: Pt. states that he drinks beer every couple of days 2 (12 oz cans)  . Drug Use: No  . Sexual Activity: Yes    Birth Control/ Protection: Condom   Other Topics Concern  . None   Social History Narrative   Additional History:    Sleep: Fair  Appetite:  Fair    Musculoskeletal: Strength & Muscle Tone: within  normal limits Gait & Station: normal Patient leans: N/A   Psychiatric Specialty Exam: Physical Exam  Review of Systems  Psychiatric/Behavioral: Positive for hallucinations and substance abuse. The patient is nervous/anxious.   All other systems reviewed and are negative.   Blood pressure 102/74, pulse 84, temperature 98.3 F (36.8 C), temperature source Oral, resp. rate 16, height 5' 4.5" (1.638 m), weight 68.04 kg (150 lb), SpO2 100 %.Body mass index is 25.36 kg/(m^2).  General Appearance: Disheveled  Eye Contact::  Minimal  Speech:  Normal Rate  Volume:  Decreased  Mood:  Anxious  Affect:  Inappropriate and Labile  Thought Process:  Disorganized improving  Orientation:  Full (Time, Place, and Person)  Thought Content:  Rumination   Suicidal Thoughts:  No  Homicidal Thoughts:  No   Memory:  Immediate;   Fair Recent;   Fair Remote;   Fair  Judgement:  Impaired  Insight:  Fair  Psychomotor Activity:  Normal  Concentration:  Poor  Recall:  [employer] of Knowledge:Fair  Language: Fair  Akathisia:  No  Handed:  Right  AIMS (if indicated):   0  Assets:  Social Support  ADL's:  Intact  Cognition: WNL  Sleep:  Number of Hours: 3.75     Current Medications: Current Facility-Administered Medications  Medication Dose Route Frequency Provider Last Rate Last Dose  . benzocaine (ORAJEL) 10 % mucosal gel   Mouth/Throat Q4H PRN Jomarie Longs, MD      . benztropine (COGENTIN) tablet 1 mg  1 mg Oral BID Adonis Brook, NP   1 mg at 08/14/15 1610  . docusate sodium (COLACE) capsule 100 mg  100 mg Oral BID Earney Navy, NP   100 mg at 08/14/15 9604  . famotidine (PEPCID) tablet 10 mg  10 mg Oral QHS Monik Lins, MD   10 mg at 08/13/15 2126  . haloperidol (HALDOL) tablet 5 mg  5 mg Oral Q breakfast Jomarie Longs, MD   5 mg at 08/14/15 5409  . haloperidol (HALDOL) tablet 7.5 mg  7.5 mg Oral QPM Aleen Marston, MD   7.5 mg at 08/13/15 1711  . ibuprofen (ADVIL,MOTRIN) tablet 400 mg  400 mg Oral Q6H PRN Earney Navy, NP      . levETIRAcetam (KEPPRA) tablet 500 mg  500 mg Oral BID Jomarie Longs, MD   500 mg at 08/14/15 8119  . LORazepam (ATIVAN) injection 2 mg  2 mg Intramuscular Q4H PRN Otho Bellows, RPH      . phenytoin (DILANTIN) ER capsule 100 mg  100 mg Oral Daily Jomarie Longs, MD   100 mg at 08/14/15 1478  . phenytoin (DILANTIN) ER capsule 200 mg  200 mg Oral QHS Jomarie Longs, MD   200 mg at 08/13/15 2126  . thiamine (VITAMIN B-1) tablet 100 mg  100 mg Oral Daily Earney Navy, NP   100 mg at 08/14/15 2956  . traZODone (DESYREL) tablet 50 mg  50 mg Oral QHS Earney Navy, NP   50 mg at 08/13/15 2126    Lab Results:  No results found for this or any previous visit (from the past 48 hour(s)).  Physical Findings: AIMS: Facial and Oral  Movements Muscles of Facial Expression: None, normal Lips and Perioral Area: None, normal Jaw: None, normal Tongue: None, normal,Extremity Movements Upper (arms, wrists, hands, fingers): None, normal Lower (legs, knees, ankles, toes): None, normal, Trunk Movements Neck, shoulders, hips: None, normal, Overall Severity Severity of abnormal movements (highest score from  questions above): None, normal Incapacitation due to abnormal movements: None, normal Patient's awareness of abnormal movements (rate only patient's report): No Awareness, Dental Status Current problems with teeth and/or dentures?: No Does patient usually wear dentures?: No  CIWA:  CIWA-Ar Total: 0 COWS:     Assessment: [name] is a 39 y.o. AA male with a PMHx of prior substance abuse, AVN L hip s/p arthroplasty, schizophrenia, and seizures, who presented to the ED escorted by [employer] with IVC paperwork stating that "respondent has been diagnosed with schizophrenia and was prescribed several medications which he does not take regularly. Pt continues to improve, had an EPS reaction to haldol - will monitor  will need medications to be readjusted.    Treatment Plan Summary: Daily contact with patient to assess and evaluate symptoms and progress in treatment and Medication management Will increase Haldol to 5 mg po daily and 7.5 mg po qpm for psychosis. Will provide Haldol decanoate IM prior to DC. Pt has agreed to it. Will continue Cogentin 0.5 mg po bid  for EPS. Will continue Trazodone 50 mg po qhs for sleep. Continue CIWA protocol/Ativan protocol prn for withdrawal sx.   Pt with Phenytoin level elevated at 20.5- 08/10/15- per neurology and pharmacy recommendations as per NP Shiela - Phenytoin continued since it is just above therapeutic level. Phenytoin level repeat  - 16. Normal.  Continue Keppra 500 mg po bid today for seizures- pt reports having several seizures right before coming to ED this admission. Will also make  available Ativan 2 mg IM prn for seizures.  Will continue to monitor vitals ,medication compliance and treatment side effects while patient is here.  Will monitor for medical issues as well as call consult as needed.  CSW will start working on disposition.  Patient to participate in therapeutic milieu .  Pt to be referred to [facility] on discharge .        Medical Decision Making:  Review of Psycho-Social Stressors (1), Review or order clinical lab tests (1), Review of Last Therapy Session (1), Review of Medication Regimen & Side Effects (2) and Review of New Medication or Change in Dosage (2)     Duane Rochford MD 08/14/2015, 3:41 PM

## 2015-08-14 NOTE — Progress Notes (Signed)
Patient ID: [name], male   DOB: 05-19-1976, 39 y.o.   MRN: [id number]  Pt currently presents with a animated affect and cooperative, ambivalent behavior. Pt more active in the milieu today, seen smiling and playing cards with peers in the dayroom.   Pt provided with medications per providers orders. Pt's labs and vitals were monitored throughout the day. Pt supported emotionally and encouraged to express concerns and questions. Pt educated on medications.  Pt's safety ensured with 15 minute and environmental checks. Pt currently denies SI/HI and A/V hallucinations. Pt verbally agrees to seek staff if SI/HI or A/VH occurs and to consult with staff before acting on these thoughts. Will continue POC.

## 2015-08-14 NOTE — Progress Notes (Signed)
Patient ID: [name], male   DOB: 04-05-1976, 39 y.o.   MRN: [id number] Adult Psychoeducational Group Note  Date:  08/14/2015 Time: 09:15 am  Group Topic/Focus:  Healthy Communication:   The focus of this group is to discuss communication, barriers to communication, as well as healthy ways to communicate with others.  Participation Level:  Did Not Attend  Participation Quality: n/a  Affect: n/a  Cognitive: n/a  Insight: n/a  Engagement in Group: n/a  Modes of Intervention:  Activity, Discussion, Education, Role-play and Support  Additional Comments:  Pt did not attend group. Pt in bed asleep.   Aurora Mask 08/14/2015, 11:06 AM

## 2015-08-15 MED ORDER — BENZTROPINE MESYLATE 1 MG/ML IJ SOLN
1.0000 mg | Freq: Once | INTRAMUSCULAR | Status: AC
  Administered 2015-08-15: 1 mg via INTRAMUSCULAR
  Filled 2015-08-15: qty 1

## 2015-08-15 MED ORDER — HALOPERIDOL DECANOATE 100 MG/ML IM SOLN: 50.0000 mg | INTRAMUSCULAR | Status: DC

## 2015-08-15 MED ORDER — HALOPERIDOL DECANOATE 100 MG/ML IM SOLN
50.0000 mg | Freq: Once | INTRAMUSCULAR | Status: AC
  Administered 2015-08-15: 50 mg via INTRAMUSCULAR
  Filled 2015-08-15: qty 0.5

## 2015-08-15 NOTE — Progress Notes (Signed)
D: Patient seen on day room watching TV and interacting with peers. Cheerful and cooperative. Denies pain, SI, AH/VH at this time. Patient stated "I"m good". Made no new complaint. A: Patient encouraged to continue with the treatment plan and verbalize needs to staff. Due medications given as ordered. Every 15 minutes check for safety maintained. Will continue to monitor patient for safety and stability.  R: Patient remains appropriate and safe.

## 2015-08-15 NOTE — Progress Notes (Signed)
Patient ID: [name], male   DOB: Mar 22, 1976, 39 y.o.   MRN: [id number]  DAR: Pt. Denies SI/HI and A/V Hallucinations. Patient does not report any pain or discomfort at this time. Support and encouragement provided to the patient. Scheduled medications administered to patient per physician's orders. Haldol dec. and cogentin injection administered and tolerated well. Patient remains minimal and somewhat reclusive in his room. He does come out for food and medications with prompting. Q15 minute checks are maintained for safety.

## 2015-08-15 NOTE — Plan of Care (Signed)
Problem: Alteration in mood & ability to function due to Goal: STG-Patient will comply with prescribed medication regimen (Patient will comply with prescribed medication regimen)  Outcome: Progressing Patient compliant with medication regimen at this time.

## 2015-08-15 NOTE — [facility] Group Notes (Signed)
[facility] Group Notes:  (Clinical Social Work)  08/15/2015  [facility] Group Notes:  (Clinical Social Work)  08/15/2015  11:00AM-12:00PM  Summary of Progress/Problems:  The main focus of today's process group was to listen to a variety of genres of music and to identify that different types of music provoke different responses.  The patient then was able to identify personally what was soothing for them, as well as energizing.  Handouts were used to record feelings evoked, as well as how patient can personally use this knowledge in sleep habits, with depression, and with other symptoms.  The patient expressed understanding of concepts, as well as knowledge of how each type of music affected him/her and how this can be used at home as a wellness/recovery tool.  Type of Therapy:  Music Therapy   Participation Level:  Active  Participation Quality:  Attentive  Affect:  Flat  Cognitive:  Alert  Insight:  Developing  Engagement in Therapy:  Developing  Modes of Intervention:   Activity, Exploration  Ambrose Mantle, LCSW 08/15/2015

## 2015-08-15 NOTE — Tx Team (Signed)
Interdisciplinary Treatment Plan Update (Adult)  Date:  08/15/2015   Time Reviewed:  1:47 PM   Progress in Treatment: Attending groups: Yes. Participating in groups:  Yes. Taking medication as prescribed:  Yes. Tolerating medication:  Yes. Family/Significant othe contact made:  Yes Patient understands diagnosis:  Yes  As evidenced by seeking help with hearing voices Discussing patient identified problems/goals with staff:  Yes, see initial care plan. Medical problems stabilized or resolved:  Yes. Denies suicidal/homicidal ideation: Yes. Issues/concerns per patient self-inventory:  No. 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  Referred for SAIOP assessment Reason for Continuation of Hospitalization:   Comments:  . [name] is a 39 y.o. AA male with a PMHx of prior substance abuse, AVN L hip s/p arthroplasty, schizophrenia, and seizures, who presented to the ED escorted by [employer] with IVC paperwork stating that "respondent has been diagnosed with schizophrenia and was prescribed several medications which he does not take regularly.  Pt with some improvement - not seen as responding to internal stimuli. Pt per staff had EPS to haldol which resolved with cogentin dose Increase. Pt currently denies any EPS sx. Pt encouraged to stay away from alcohol and stay on medications.   Haldol, Haldol Dec. Kepra, Cogentin, Trazodone trial  Estimated length of stay: Likely d/c tomorrow  New goal(s):  Review of initial/current patient goals per problem list:   Review of initial/current patient goals per problem list:  1. Goal(s): Patient will participate in aftercare plan   Met: Yes   Target date: 3-5 days post admission date   As evidenced by: Patient will participate within aftercare plan AEB aftercare provider and housing plan at discharge  being identified.  08/15/2015   Return home, follow up outpt at [facility]     4. Goal(s): Patient will demonstrate decreased signs of withdrawal due to substance abuse   Met: Yes  Target date: 3-5 days post admission date   As evidenced by: Patient will produce a CIWA/COWS score of 0, have stable vitals signs, and no symptoms of withdrawal 08/15/2015  No signs nor symptoms of withdrawal today      5. Goal(s): Patient will demonstrate decreased signs of psychosis  * Met: Yes  * Target date: 3-5 days post admission date  * As evidenced by: Patient will demonstrate decreased frequency of AVH or return to baseline function 08/15/2015  [name] has agreed to Haldol Dec shot due to his history of noncompliance with meds.  He has been taking meds as prescribed here.  No signs nor symptoms of psychosis          Attendees: Patient:  08/15/2015 1:47 PM   Family:   08/15/2015 1:47 PM   Physician:  Ursula Alert, MD 08/15/2015 1:47 PM   Nursing:   Gaylan Gerold, RN 08/15/2015 1:47 PM   CSW:    Roque Lias, LCSW   08/15/2015 1:47 PM   Other:  08/15/2015 1:47 PM   Other:   08/15/2015 1:47 PM   Other:  Lars Pinks, Nurse CM 08/15/2015 1:47 PM   Other:  Lucinda Dell, Beverly Sessions TCT 08/15/2015 1:47 PM   Other:  Norberto Sorenson, [place]  08/15/2015 1:47 PM   Other:  08/15/2015 1:47 PM   Other:  08/15/2015 1:47 PM   Other:  08/15/2015 1:47 PM   Other:  08/15/2015 1:47 PM  Other:  08/15/2015 1:47 PM   Other:   08/15/2015 1:47 PM    Scribe for Treatment Team:   Trish Mage, 08/15/2015 1:47 PM

## 2015-08-15 NOTE — [facility] Group Notes (Signed)
[facility] Group Notes:  (Nursing/MHT/Case Management/Adjunct)  Date:  08/15/2015  Time:  11:18 AM  Type of Therapy:  Psychoeducational Skills  Participation Level:  Did Not Attend  Participation Quality:  Did Not Attend  Affect:  Did Not Attend  Cognitive:  Did Not Attend  Insight:  None  Engagement in Group:  Did Not Attend  Modes of Intervention:  Did Not Attend  Summary of Progress/Problems: Pt did not attend patient self inventory group.   Jacquelyne Balint Shanta 08/15/2015, 11:18 AM

## 2015-08-15 NOTE — Progress Notes (Addendum)
[facility] MD Progress Note  08/15/2015 2:28 PM [name]  MRN:  [id number] Subjective: Patient states " I am not feeling too good. I feel drowsy.'      Objective: Patient seen and chart reviewed.Discussed patient with treatment team. [name] is a 39 y.o. AA male with a PMHx of prior substance abuse, AVN L hip s/p arthroplasty, schizophrenia, and seizures, who presented to the ED escorted by [employer] with IVC paperwork stating that "respondent has been diagnosed with schizophrenia and was prescribed several medications which he does not take regularly.  Pt today with drowsiness in the AM - states 2/2 to his AM medications. Pt otherwise denies any AH, SI. Not seen as responding to internal stimuli. Pt with no EPS concerns today - AIMS - 0 . Pt to have an LAI today . Pt encouraged to stay away from alcohol and stay on medications. Staff notes - no disruptive issues on the unit.   Principal Problem: Paranoid schizophrenia Diagnosis:   Patient Active Problem List   Diagnosis Date Noted  . Paranoid schizophrenia [F20.0] 08/11/2015  . Seizure disorder [G40.909] 08/10/2015  . Alcohol use disorder, severe, dependence [F10.20] 08/10/2015  . Hx of schizophrenia [Z86.59]   . Expected blood loss anemia [D50.0] 04/30/2013  . Overweight (BMI 25.0-29.9) [E66.3] 04/30/2013  . S/P left THA, AA [Z96.60] 04/29/2013   Total Time spent with patient: 25 minutes   Past Medical History:  Past Medical History  Diagnosis Date  . Substance abuse     has not used for 5-6 yrs  . Avascular necrosis of hip     left  . Schizophrenia   . Seizures     HX [place] seiaures - last seizure 6-7 months ago  . Avascular necrosis     L HIP  . Arthritis     hands    Past Surgical History  Procedure Laterality Date  . No past surgeries    . Total hip arthroplasty Left 04/29/2013    Procedure: TOTAL HIP ARTHROPLASTY ANTERIOR APPROACH;  Surgeon: Shelda Pal, MD;  Location: [facility];  Service: Orthopedics;   Laterality: Left;   Family History:  Family History  Problem Relation Age of Onset  . Cancer Brother   . Diabetes Mother   . Hypertension Mother   . Cancer Father   . Hypertension Father   . Hypertension Sister   . Diabetes Maternal Grandmother    Social History:  History  Alcohol Use  . 3.6 oz/week  . 6 Cans of beer per week    Comment: Pt. states that he drinks beer every couple of days 2 (12 oz cans)     History  Drug Use No    Social History   Social History  . Marital Status: Single    Spouse Name: N/A  . Number of Children: N/A  . Years of Education: N/A   Social History Main Topics  . Smoking status: Current Every Day Smoker -- 1.50 packs/day for 5 years    Types: Cigarettes  . Smokeless tobacco: Never Used  . Alcohol Use: 3.6 oz/week    6 Cans of beer per week     Comment: Pt. states that he drinks beer every couple of days 2 (12 oz cans)  . Drug Use: No  . Sexual Activity: Yes    Birth Control/ Protection: Condom   Other Topics Concern  . None   Social History Narrative   Additional History:    Sleep: Fair  Appetite:  Fair    Musculoskeletal: Strength & Muscle Tone: within normal limits Gait & Station: normal Patient leans: N/A   Psychiatric Specialty Exam: Physical Exam  Review of Systems  Psychiatric/Behavioral: Positive for hallucinations and substance abuse. The patient is nervous/anxious.   All other systems reviewed and are negative.   Blood pressure 104/70, pulse 84, temperature 98.5 F (36.9 C), temperature source Oral, resp. rate 16, height 5' 4.5" (1.638 m), weight 68.04 kg (150 lb), SpO2 100 %.Body mass index is 25.36 kg/(m^2).  General Appearance: Disheveled  Eye Contact::  Minimal  Speech:  Normal Rate  Volume:  Decreased  Mood:  Anxious  Affect:  Constricted  Thought Process:  Disorganized improving  Orientation:  Full (Time, Place, and Person)  Thought Content:  Rumination   Suicidal Thoughts:  No  Homicidal  Thoughts:  No  Memory:  Immediate;   Fair Recent;   Fair Remote;   Fair  Judgement:  Impaired  Insight:  Fair  Psychomotor Activity:  Normal  Concentration:  Fair  Recall:  [employer] of Knowledge:Fair  Language: Fair  Akathisia:  No  Handed:  Right  AIMS (if indicated):   0  Assets:  Social Support  ADL's:  Intact  Cognition: WNL  Sleep:  Number of Hours: 3.75     Current Medications: Current Facility-Administered Medications  Medication Dose Route Frequency Provider Last Rate Last Dose  . benzocaine (ORAJEL) 10 % mucosal gel   Mouth/Throat Q4H PRN Jomarie Longs, MD      . benztropine (COGENTIN) tablet 1 mg  1 mg Oral BID Adonis Brook, NP   1 mg at 08/15/15 4782  . benztropine mesylate (COGENTIN) injection 1 mg  1 mg Intramuscular Once Angelee Bahr, MD      . docusate sodium (COLACE) capsule 100 mg  100 mg Oral BID Earney Navy, NP   100 mg at 08/15/15 0823  . famotidine (PEPCID) tablet 10 mg  10 mg Oral QHS Jomarie Longs, MD   10 mg at 08/14/15 1000  . haloperidol (HALDOL) tablet 5 mg  5 mg Oral Q breakfast Jomarie Longs, MD   5 mg at 08/15/15 9562  . haloperidol (HALDOL) tablet 7.5 mg  7.5 mg Oral QPM Calven Gilkes, MD   7.5 mg at 08/14/15 1826  . haloperidol decanoate (HALDOL DECANOATE) 100 MG/ML injection 50 mg  50 mg Intramuscular Once Jomarie Longs, MD      . Melene Muller ON 09/03/2015] haloperidol decanoate (HALDOL DECANOATE) 100 MG/ML injection 50 mg  50 mg Intramuscular Q21 days Jomarie Longs, MD      . ibuprofen (ADVIL,MOTRIN) tablet 400 mg  400 mg Oral Q6H PRN Earney Navy, NP      . levETIRAcetam (KEPPRA) tablet 500 mg  500 mg Oral BID Jomarie Longs, MD   500 mg at 08/15/15 0824  . LORazepam (ATIVAN) injection 2 mg  2 mg Intramuscular Q4H PRN Otho Bellows, RPH      . phenytoin (DILANTIN) ER capsule 100 mg  100 mg Oral Daily Jomarie Longs, MD   100 mg at 08/15/15 0824  . phenytoin (DILANTIN) ER capsule 200 mg  200 mg Oral QHS Jomarie Longs, MD   200  mg at 08/14/15 2124  . thiamine (VITAMIN B-1) tablet 100 mg  100 mg Oral Daily Earney Navy, NP   100 mg at 08/15/15 0824  . traZODone (DESYREL) tablet 50 mg  50 mg Oral QHS Earney Navy, NP   50 mg at 08/14/15  2125    Lab Results:  No results found for this or any previous visit (from the past 48 hour(s)).  Physical Findings: AIMS: Facial and Oral Movements Muscles of Facial Expression: None, normal Lips and Perioral Area: None, normal Jaw: None, normal Tongue: None, normal,Extremity Movements Upper (arms, wrists, hands, fingers): None, normal Lower (legs, knees, ankles, toes): None, normal, Trunk Movements Neck, shoulders, hips: None, normal, Overall Severity Severity of abnormal movements (highest score from questions above): None, normal Incapacitation due to abnormal movements: None, normal Patient's awareness of abnormal movements (rate only patient's report): No Awareness, Dental Status Current problems with teeth and/or dentures?: No Does patient usually wear dentures?: No  CIWA:  CIWA-Ar Total: 0 COWS:     Assessment: [name] is a 39 y.o. AA male with a PMHx of prior substance abuse, AVN L hip s/p arthroplasty, schizophrenia, and seizures, who presented to the ED escorted by [employer] with IVC paperwork stating that "respondent has been diagnosed with schizophrenia and was prescribed several medications which he does not take regularly. Pt continues to improve, will provide Haldol decanoate 50 mg IM today . Will monitor for side effects .    Treatment Plan Summary: Daily contact with patient to assess and evaluate symptoms and progress in treatment and Medication management Will continue Haldol to 5 mg po daily and 7.5 mg po qpm for psychosis. Will provide Haldol decanoate IM 50 mg today . Haldol decanoate 50 IM to be repeated every 3 weeks . Next dose on 09/06/15.  Pt has agreed to it. Will continue Cogentin 0.5 mg po bid  for EPS. Will continue Trazodone 50 mg  po qhs for sleep. Continue CIWA protocol/Ativan protocol prn for withdrawal sx.   Pt with Phenytoin level elevated at 20.5- 08/10/15- per neurology and pharmacy recommendations as per NP Shiela - Phenytoin continued since it is just above therapeutic level. Phenytoin level repeat  - 16. Normal.  Continue Keppra 500 mg po bid today for seizures- pt reports having several seizures right before coming to ED this admission. Will also make available Ativan 2 mg IM prn for seizures.  Will continue to monitor vitals ,medication compliance and treatment side effects while patient is here.  Will monitor for medical issues as well as call consult as needed.  CSW will start working on disposition.  Patient to participate in therapeutic milieu .  Pt to be [facility] to [facility] on discharge .  Pt to be discharged tomorrow.      Medical Decision Making:  Review of Psycho-Social Stressors (1), Review or order clinical lab tests (1), Review of Last Therapy Session (1), Review of Medication Regimen & Side Effects (2) and Review of New Medication or Change in Dosage (2)     Jerrie Gullo MD 08/15/2015, 2:28 PM

## 2015-08-15 NOTE — Progress Notes (Signed)
D: Patient seen on dayroom interacting with peers and watching TV. Denied pain, SI, AH/VH. Pleasant and cooperative. Compliant with medications. No new complaint.  A: Patient encouraged to continue with the treatment plan and verbalize concerns to staff. Every 15 minutes check for safety maintained. Will continue to monitor patient. R: Patient remains appropriate and safe.

## 2015-08-15 NOTE — Progress Notes (Signed)
Adult Psychoeducational Group Note  Date:  08/15/2015 Time:  9:45 PM  Group Topic/Focus:  Wrap-Up Group:   The focus of this group is to help patients review their daily goal of treatment and discuss progress on daily workbooks.  Participation Level:  Active  Participation Quality:  Appropriate  Affect:  Blunted  Cognitive:  Appropriate  Insight: Appropriate  Engagement in Group:  Engaged  Modes of Intervention:  Discussion  Additional Comments: The patient expressed that he attended group.The patient also said that music therapy brought back good memories.  Octavio Manns 08/15/2015, 9:45 PM

## 2015-08-16 DIAGNOSIS — F200 Paranoid schizophrenia: Principal | ICD-10-CM

## 2015-08-16 MED ORDER — HALOPERIDOL 10 MG PO TABS: ORAL_TABLET | ORAL | Status: DC

## 2015-08-16 MED ORDER — EPINEPHRINE 0.3 MG/0.3ML IJ SOAJ: 0.3000 mg | INTRAMUSCULAR | Status: DC | PRN

## 2015-08-16 MED ORDER — PHENYTOIN SODIUM EXTENDED 200 MG PO CAPS: 200.0000 mg | ORAL_CAPSULE | Freq: Every day | ORAL | Status: DC

## 2015-08-16 MED ORDER — HALOPERIDOL DECANOATE 100 MG/ML IM SOLN: 50.0000 mg | INTRAMUSCULAR | Status: DC

## 2015-08-16 MED ORDER — DSS 100 MG PO CAPS: 100.0000 mg | ORAL_CAPSULE | Freq: Two times a day (BID) | ORAL | Status: DC

## 2015-08-16 MED ORDER — LEVETIRACETAM 500 MG PO TABS: 500.0000 mg | ORAL_TABLET | Freq: Two times a day (BID) | ORAL | Status: DC

## 2015-08-16 MED ORDER — BENZTROPINE MESYLATE 1 MG PO TABS: 1.0000 mg | ORAL_TABLET | Freq: Two times a day (BID) | ORAL | Status: DC

## 2015-08-16 MED ORDER — PHENYTOIN SODIUM EXTENDED 100 MG PO CAPS: 100.0000 mg | ORAL_CAPSULE | Freq: Every day | ORAL | Status: DC

## 2015-08-16 MED ORDER — HALOPERIDOL 5 MG PO TABS: 5.0000 mg | ORAL_TABLET | Freq: Every day | ORAL | Status: DC

## 2015-08-16 MED ORDER — TRAZODONE HCL 50 MG PO TABS: 50.0000 mg | ORAL_TABLET | Freq: Every day | ORAL | Status: DC

## 2015-08-16 NOTE — [facility] Suicide Risk Assessment (Signed)
[facility] Discharge Suicide Risk Assessment   Demographic Factors:  39 year old , lives with a roommate, has three children, who live with the mother . On disability- has a payee .  Total Time spent with patient: 30 minutes  Musculoskeletal: Strength & Muscle Tone: within normal limits Gait & Station: normal Patient leans: N/A  Psychiatric Specialty Exam: Physical Exam  ROS  Blood pressure 123/71, pulse 54, temperature 98.5 F (36.9 C), temperature source Oral, resp. rate 20, height 5' 4.5" (1.638 m), weight 150 lb (68.04 kg), SpO2 100 %.Body mass index is 25.36 kg/(m^2).  General Appearance: Fairly Groomed  Patent attorney::  Good  Speech:  Normal Rate409  Volume:  Normal  Mood:  states he feels better  Affect:  Appropriate, mildly constricted   Thought Process:  Linear  Orientation:  Other:  fully alert and attentive   Thought Content:  no hallucinations, no delusions expressed, does not appear internally preoccupied   Suicidal Thoughts:  No  Homicidal Thoughts:  No  Memory:  recent and remote grossly intact   Judgement:   Improved   Insight:  improved   Psychomotor Activity:  Normal  Concentration:  Good  Recall:  Good  Fund of Knowledge:Good  Language: Good  Akathisia:  Negative  Handed:  Right  AIMS (if indicated):     Assets:  Desire for Improvement Resilience  Sleep:  Number of Hours: 6.75  Cognition: WNL  ADL's:  Improved    Have you used any form of tobacco in the last 30 days? (Cigarettes, Smokeless Tobacco, Cigars, and/or Pipes): Yes  Has this patient used any form of tobacco in the last 30 days? (Cigarettes, Smokeless Tobacco, Cigars, and/or Pipes) Yes, A prescription for an FDA-approved tobacco cessation medication was offered at discharge and the patient refused  Mental Status Per Nursing Assessment::   On Admission:  NA  Current Mental Status by Physician: At this time patient improved, currently denies significant depression, affect is reactive, but does  appear mildly constricted,  denies hallucinations, no delusions at this time , does not appear internally preoccupied, no SI or HI, no psychomotor agitation.   Loss Factors: Chronic  Mental illness, disability, family stressors   Historical Factors: History of schizophrenia, several prior psychiatric admissions, history of alcohol abuse .   Risk Reduction Factors:   Sense of responsibility to family, Living with another person, especially a relative and Positive coping skills or problem solving skills  Continued Clinical Symptoms:  As noted, improved compared to admission, denies SI or HI, denies ongoing psychotic symptoms  Cognitive Features That Contribute To Risk:  No gross cognitive deficits noted upon discharge. Is alert , attentive, and oriented x 3   Suicide Risk:  Mild:  Suicidal ideation of limited frequency, intensity, duration, and specificity.  There are no identifiable plans, no associated intent, mild dysphoria and related symptoms, good self-control (both objective and subjective assessment), few other risk factors, and identifiable protective factors, including available and accessible social support.  Principal Problem: Paranoid schizophrenia Discharge Diagnoses:  Patient Active Problem List   Diagnosis Date Noted  . Paranoid schizophrenia [F20.0] 08/11/2015  . Seizure disorder [G40.909] 08/10/2015  . Alcohol use disorder, severe, dependence [F10.20] 08/10/2015  . Hx of schizophrenia [Z86.59]   . Expected blood loss anemia [D50.0] 04/30/2013  . Overweight (BMI 25.0-29.9) [E66.3] 04/30/2013  . S/P left THA, AA [Z96.60] 04/29/2013    Follow-up Information    Follow up with [facility] On 08/18/2015.   Why:  Wednesday at 2:00 for [facility] assessment   Contact information:   [address]  [place] [phone]] [phone]      Plan Of Care/Follow-up recommendations:  Activity:  as  tolerated  Diet:  regular Tests:  NA Other:  see below   Is patient on  multiple antipsychotic therapies at discharge:  No   Has Patient had three or more failed trials of antipsychotic monotherapy by history:  No  Recommended Plan for Multiple Antipsychotic Therapies: NA   He states he is leaving in good spirits .  Plans to live with his sister for the time being- he states she is very supportive Follow up as above Encouraged to maintain abstinence from alcohol.    Astha Probasco 08/16/2015, 12:13 PM

## 2015-08-16 NOTE — Progress Notes (Addendum)
  [facility] Adult Case Management Discharge Plan :  Will you be returning to the same living situation after discharge:  No. States he will be staying with his sister At discharge, do you have transportation home?: Yes,  sister Do you have the ability to pay for your medications: Yes,  MCD  Release of information consent forms completed and in the chart;  Patient's signature needed at discharge.  Patient to Follow up at: Follow-up Information    Follow up with [facility] On 08/18/2015.   Why:  Wednesday at 2:00 for [facility] assessment   Contact information:   [address]  [place] [phone]] [phone]      Patient denies SI/HI: Yes,  yes    Safety Planning and Suicide Prevention discussed: Yes,  yes  Have you used any form of tobacco in the last 30 days? (Cigarettes, Smokeless Tobacco, Cigars, and/or Pipes): Yes  Has patient been referred to the Quitline?: Patient refused referral  Ida Rogue 08/16/2015, 11:33 AM

## 2015-08-16 NOTE — Progress Notes (Signed)
Patient ID: [name], male   DOB: 01/01/1976, 39 y.o.   MRN: [id number]  Pt. Denies SI/HI and A/V hallucinations. Belongings returned to patient at time of discharge. Patient denies any pain or discomfort. Discharge instructions and medications were reviewed with patient. Patient verbalized understanding of both medications and discharge instructions. Patient was discharged to lobby where a family member was picking him up. Q15 minute safety checks maintained until discharge. No distress upon discharge.
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
## 2015-12-04 ENCOUNTER — Encounter (HOSPITAL_COMMUNITY): Payer: Self-pay | Admitting: Emergency Medicine

## 2015-12-04 ENCOUNTER — Emergency Department (HOSPITAL_COMMUNITY)
Admission: EM | Admit: 2015-12-04 | Discharge: 2015-12-04 | Disposition: A | Discharge status: Home / Self Care | Payer: Medicaid Other | Attending: Emergency Medicine | Admitting: Emergency Medicine

## 2015-12-04 DIAGNOSIS — Z8659 Personal history of other mental and behavioral disorders: Secondary | ICD-10-CM | POA: Insufficient documentation

## 2015-12-04 DIAGNOSIS — Z23 Encounter for immunization: Secondary | ICD-10-CM | POA: Insufficient documentation

## 2015-12-04 DIAGNOSIS — F329 Major depressive disorder, single episode, unspecified: Secondary | ICD-10-CM | POA: Insufficient documentation

## 2015-12-04 DIAGNOSIS — F17210 Nicotine dependence, cigarettes, uncomplicated: Secondary | ICD-10-CM | POA: Diagnosis not present

## 2015-12-04 DIAGNOSIS — Y998 Other external cause status: Secondary | ICD-10-CM | POA: Diagnosis not present

## 2015-12-04 DIAGNOSIS — IMO0002 Reserved for concepts with insufficient information to code with codable children: Secondary | ICD-10-CM

## 2015-12-04 DIAGNOSIS — Y9389 Activity, other specified: Secondary | ICD-10-CM | POA: Insufficient documentation

## 2015-12-04 DIAGNOSIS — Y9289 Other specified places as the place of occurrence of the external cause: Secondary | ICD-10-CM | POA: Insufficient documentation

## 2015-12-04 DIAGNOSIS — Z8739 Personal history of other diseases of the musculoskeletal system and connective tissue: Secondary | ICD-10-CM | POA: Insufficient documentation

## 2015-12-04 DIAGNOSIS — W1839XA Other fall on same level, initial encounter: Secondary | ICD-10-CM | POA: Insufficient documentation

## 2015-12-04 DIAGNOSIS — S31811A Laceration without foreign body of right buttock, initial encounter: Secondary | ICD-10-CM | POA: Insufficient documentation

## 2015-12-04 MED ORDER — LORAZEPAM 1 MG PO TABS
0.0000 mg | ORAL_TABLET | Freq: Four times a day (QID) | ORAL | Status: DC
Start: 2015-12-04 — End: 2015-12-04
  Filled 2015-12-04: qty 1

## 2015-12-04 MED ORDER — LORAZEPAM 2 MG/ML IJ SOLN: 0.0000 mg | Freq: Four times a day (QID) | INTRAMUSCULAR | Status: DC

## 2015-12-04 MED ORDER — THIAMINE HCL 100 MG/ML IJ SOLN: 100.0000 mg | Freq: Every day | INTRAMUSCULAR | Status: DC

## 2015-12-04 MED ORDER — LORAZEPAM 1 MG PO TABS
0.0000 mg | ORAL_TABLET | Freq: Two times a day (BID) | ORAL | Status: DC
  Administered 2015-12-04: 1 mg via ORAL

## 2015-12-04 MED ORDER — LIDOCAINE HCL 2 % IJ SOLN
5.0000 mL | Freq: Once | INTRAMUSCULAR | Status: AC
  Administered 2015-12-04: 100 mg
  Filled 2015-12-04: qty 20

## 2015-12-04 MED ORDER — ACETAMINOPHEN 500 MG PO TABS
1000.0000 mg | ORAL_TABLET | Freq: Once | ORAL | Status: AC
  Administered 2015-12-04: 1000 mg via ORAL
  Filled 2015-12-04: qty 2

## 2015-12-04 MED ORDER — VITAMIN B-1 100 MG PO TABS
100.0000 mg | ORAL_TABLET | Freq: Every day | ORAL | Status: DC
  Administered 2015-12-04: 100 mg via ORAL
  Filled 2015-12-04: qty 1

## 2015-12-04 MED ORDER — TETANUS-DIPHTH-ACELL PERTUSSIS 5-2.5-18.5 LF-MCG/0.5 IM SUSP
0.5000 mL | Freq: Once | INTRAMUSCULAR | Status: AC
  Administered 2015-12-04: 0.5 mL via INTRAMUSCULAR
  Filled 2015-12-04: qty 0.5

## 2015-12-04 MED ORDER — LORAZEPAM 2 MG/ML IJ SOLN: 0.0000 mg | Freq: Two times a day (BID) | INTRAMUSCULAR | Status: DC

## 2015-12-04 NOTE — ED Provider Notes (Signed)
CSN: [id number]     Arrival date & time 12/04/15  1309 History   First MD Initiated Contact with Patient 12/04/15 1335     Chief Complaint  Patient presents with  . Laceration  . Fall   HPI  [name] is a 39 y.o. M PMH significant for schizophrenia, seizures presenting with a right buttock laceration and puncture wound. He states he fell into a glass table last night, a mechanical fall. Upon initial RN evaluation, the patient told the RN he has not taken his psych meds in 2 months, has seen visions and heard voices intermittently of his mother and sister, both of whom are dead. He is requesting psych consult for medication management. He admits to this provider that he feels depressed. He denies currently hearing or seeing things, SI, HI, fevers, chills, drainage, loss of bowel/bladder control.   Of note, his clothes are bloody but he is wearing the same clothing he had on last night.    Past Medical History  Diagnosis Date  . Substance abuse     has not used for 5-6 yrs  . Avascular necrosis of hip (HCC)     left  . Schizophrenia (HCC)   . Seizures (HCC)     HX [place] seiaures - last seizure 6-7 months ago  . Avascular necrosis (HCC)     L HIP  . Arthritis     hands   Past Surgical History  Procedure Laterality Date  . No past surgeries    . 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 Cancer Brother   . Diabetes Mother   . Hypertension Mother   . Cancer Father   . Hypertension Father   . Hypertension Sister   . Diabetes Maternal Grandmother    Social History  Substance Use Topics  . Smoking status: Current Every Day Smoker -- 1.50 packs/day for 5 years    Types: Cigarettes  . Smokeless tobacco: Never Used  . Alcohol Use: 3.6 oz/week    6 Cans of beer per week   Comment: Pt. states that he drinks beer every couple of days 2 (12 oz cans)    Review of Systems  Ten systems are reviewed and are negative for acute change except as noted in the HPI   Allergies  Bee venom  Home Medications   Prior to Admission medications   Medication Sig Start Date End Date Taking? Authorizing Provider  benztropine (COGENTIN) 1 MG tablet Take 1 tablet (1 mg total) by mouth 2 (two) times daily. Patient not taking: Reported on 12/04/2015 08/16/15   Thermon Leyland, NP  Docusate Sodium (DSS) 100 MG CAPS Take 100 mg by mouth 2 (two) times daily. Patient not taking: Reported on 12/04/2015 08/16/15   Thermon Leyland, NP  EPINEPHrine 0.3 mg/0.3 mL IJ SOAJ injection Inject 0.3 mLs (0.3 mg total) into the muscle as needed. Patient not taking: Reported on 12/04/2015 08/16/15   Thermon Leyland, NP  haloperidol (HALDOL) 10 MG tablet Take one tablet (10 mg) at bedtime. Patient not taking: Reported on 12/04/2015 08/16/15   Thermon Leyland, NP  haloperidol (HALDOL) 5 MG tablet Take 1 tablet (5 mg total) by mouth daily with breakfast. Patient not taking: Reported on 12/04/2015 08/16/15   Thermon Leyland, NP  haloperidol decanoate (HALDOL DECANOATE) 100 MG/ML injection Inject 0.5 mLs (50 mg total) into the muscle every 21 ( twenty-one) days. Patient not taking: Reported on 12/04/2015 09/06/15   Thermon Leyland, NP  levETIRAcetam (KEPPRA) 500 MG tablet Take 1 tablet (500 mg total) by mouth 2 (two) times daily. Patient not taking: Reported on 12/04/2015 08/16/15   Thermon Leyland, NP  phenytoin (DILANTIN) 100 MG ER capsule Take 1 capsule (100 mg total) by mouth daily. Patient not taking: Reported on 12/04/2015 08/16/15   Thermon Leyland, NP  phenytoin (DILANTIN) 200 MG ER capsule Take 1 capsule (200 mg total) by mouth at bedtime. Patient not taking: Reported on 12/04/2015 08/16/15   Thermon Leyland, NP  traZODone (DESYREL) 50 MG tablet Take 1 tablet (50 mg total) by mouth at bedtime. Patient not taking:  Reported on 12/04/2015 08/16/15   Thermon Leyland, NP   BP 105/78 mmHg  Pulse 89  Temp(Src) 98.9 F (37.2 C) (Oral)  Resp 16  SpO2 98% Physical Exam  Constitutional: He is oriented to person, place, and time. He appears well-developed and well-nourished. No distress.  HENT:  Head: Normocephalic and atraumatic.  Mouth/Throat: Oropharynx is clear and moist. No oropharyngeal exudate.  Eyes: Conjunctivae are normal. Pupils are equal, round, and reactive to light. Right eye exhibits no discharge. Left eye exhibits no discharge. No scleral icterus.  Neck: No tracheal deviation present.  Cardiovascular: Normal rate, regular rhythm, normal heart sounds and intact distal pulses.  Exam reveals no gallop and no friction rub.   No murmur heard. Pulmonary/Chest: Effort normal and breath sounds normal. No respiratory distress. He has no wheezes. He has no rales. He exhibits no tenderness.  Abdominal: Soft. Bowel sounds are normal. He exhibits no distension and no mass. There is no tenderness. There is no rebound and no guarding.  Musculoskeletal: He exhibits no edema.  Lymphadenopathy:    He has no cervical adenopathy.  Neurological: He is alert and oriented to person, place, and time. No cranial nerve deficit. Coordination normal.  Skin: Skin is warm and dry. No rash noted. He is not diaphoretic. No erythema.  One 2 cm laceration and one puncture wound on right buttocks. No evidence of infection or foreign body.   Psychiatric: He has a normal mood and affect. His behavior is normal.  Depressed appearing  Nursing note and vitals reviewed.   ED Course  Irrigation Date/Time: 12/04/2015 4:42 PM Performed by: Althea Grimmer NICOLE Authorized by: Melton Krebs Consent: Verbal consent obtained. Risks and benefits: risks, benefits and alternatives were discussed Consent given by: patient Patient understanding: patient states understanding of the procedure being performed Patient identity  confirmed: verbally with patient, arm band and hospital-assigned identification number Local anesthesia used: yes Anesthesia: local infiltration Local anesthetic: lidocaine 2% without epinephrine Patient sedated: no Patient tolerance: Patient tolerated the procedure well with no immediate complications     MDM   Final diagnoses:  Hx of schizophrenia  Laceration   Patient depressed appearing. Will consult TTS per patient request to be restarted on meds. Wounds without evidence of infection or foreign body. Because fall happened last night, will not close wound.  Irrigated (1 L NS, 60 cc syringe, splash guard), dressed wound, applied bacitracin. Patient tolerated procedure well.   Patient care handoff to Ivar Drape, PA-C at shift change. Pending TTS consult, patient may be safely discharged home. We discussed wound care and reasons to return. Patient in understanding and agreement with plan.  Melton KrebsSamantha Nicole Baeleigh Devincent, PA-C 12/04/15 1652  Margarita Grizzleanielle Ray, MD 12/05/15 [phone]

## 2015-12-04 NOTE — Discharge Instructions (Signed)
Mr. [name],  Nice meeting you! Please follow-up with a primary care provider. Return to the emergency department if you develop fevers, chills, yellow/green drainage from your wounds, have nausea or vomiting, or if you feel like hurting yourself or others. Feel better soon!  S. Lane HackerNicole Kelse Ploch, PA-C
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
## 2015-12-04 NOTE — ED Provider Notes (Signed)
Patient signed out to me.  Medically cleared by morning team.  Patient is pending a TTS consult for patient "seeing visions."  5:47 PM Discussed with [name] form TTS.  Recommends outpatient treatment.  Resources for follow-up given to patient.    Patient reassessed by me.  Tachycardic and nauseated.  Rechecked wound, looks fine, no bleeding.  Patient states that he normally have several shots per day and some beer.  Suspect that he is having some withdrawal symptoms.  CIWA is 2.  Will give a dose of ativan and DC.   Duane Horsemanobert Kynslie Ringle, [name] 12/04/15 2123  Duane CoreNathan Pickering, MD 12/04/15 [phone]

## 2015-12-04 NOTE — ED Notes (Signed)
TELEPSYCH at bedside

## 2016-04-26 ENCOUNTER — Emergency Department (HOSPITAL_COMMUNITY)
Admission: EM | Admit: 2016-04-26 | Discharge: 2016-04-26 | Disposition: A | Discharge status: Home / Self Care | Payer: No Typology Code available for payment source | Attending: Emergency Medicine | Admitting: Emergency Medicine

## 2016-04-26 ENCOUNTER — Emergency Department (HOSPITAL_COMMUNITY): Payer: No Typology Code available for payment source

## 2016-04-26 ENCOUNTER — Encounter (HOSPITAL_COMMUNITY): Payer: Self-pay | Admitting: Emergency Medicine

## 2016-04-26 DIAGNOSIS — Y9389 Activity, other specified: Secondary | ICD-10-CM | POA: Insufficient documentation

## 2016-04-26 DIAGNOSIS — S3992XA Unspecified injury of lower back, initial encounter: Secondary | ICD-10-CM | POA: Diagnosis not present

## 2016-04-26 DIAGNOSIS — R519 Headache, unspecified: Secondary | ICD-10-CM

## 2016-04-26 DIAGNOSIS — Y998 Other external cause status: Secondary | ICD-10-CM | POA: Insufficient documentation

## 2016-04-26 DIAGNOSIS — S00511A Abrasion of lip, initial encounter: Secondary | ICD-10-CM | POA: Insufficient documentation

## 2016-04-26 DIAGNOSIS — Z23 Encounter for immunization: Secondary | ICD-10-CM | POA: Insufficient documentation

## 2016-04-26 DIAGNOSIS — M542 Cervicalgia: Secondary | ICD-10-CM

## 2016-04-26 DIAGNOSIS — R55 Syncope and collapse: Secondary | ICD-10-CM | POA: Insufficient documentation

## 2016-04-26 DIAGNOSIS — S0990XA Unspecified injury of head, initial encounter: Secondary | ICD-10-CM | POA: Diagnosis present

## 2016-04-26 DIAGNOSIS — S199XXA Unspecified injury of neck, initial encounter: Secondary | ICD-10-CM | POA: Insufficient documentation

## 2016-04-26 DIAGNOSIS — Y92410 Unspecified street and highway as the place of occurrence of the external cause: Secondary | ICD-10-CM | POA: Insufficient documentation

## 2016-04-26 DIAGNOSIS — M5030 Other cervical disc degeneration, unspecified cervical region: Secondary | ICD-10-CM | POA: Diagnosis not present

## 2016-04-26 DIAGNOSIS — Z8659 Personal history of other mental and behavioral disorders: Secondary | ICD-10-CM | POA: Insufficient documentation

## 2016-04-26 DIAGNOSIS — F17210 Nicotine dependence, cigarettes, uncomplicated: Secondary | ICD-10-CM | POA: Insufficient documentation

## 2016-04-26 DIAGNOSIS — R51 Headache: Secondary | ICD-10-CM

## 2016-04-26 MED ORDER — TETANUS-DIPHTH-ACELL PERTUSSIS 5-2.5-18.5 LF-MCG/0.5 IM SUSP
0.5000 mL | Freq: Once | INTRAMUSCULAR | Status: AC
  Administered 2016-04-26: 0.5 mL via INTRAMUSCULAR
  Filled 2016-04-26: qty 0.5

## 2016-04-26 MED ORDER — CYCLOBENZAPRINE HCL 10 MG PO TABS: 10.0000 mg | ORAL_TABLET | Freq: Three times a day (TID) | ORAL | Status: DC | PRN

## 2016-04-26 MED ORDER — IBUPROFEN 800 MG PO TABS: 800.0000 mg | ORAL_TABLET | Freq: Three times a day (TID) | ORAL | Status: DC | PRN

## 2016-04-26 MED ORDER — IBUPROFEN 400 MG PO TABS
600.0000 mg | ORAL_TABLET | Freq: Once | ORAL | Status: AC
  Administered 2016-04-26: 600 mg via ORAL
  Filled 2016-04-26: qty 1
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
## 2016-08-25 ENCOUNTER — Encounter (HOSPITAL_COMMUNITY): Payer: Self-pay

## 2016-08-25 ENCOUNTER — Inpatient Hospital Stay (HOSPITAL_COMMUNITY)
Admission: EM | Admit: 2016-08-25 | Discharge: 2016-08-29 | DRG: 101 | Discharge status: Against Medical Advice | Payer: Medicaid Other | Attending: Internal Medicine | Admitting: Internal Medicine

## 2016-08-25 ENCOUNTER — Emergency Department (HOSPITAL_COMMUNITY): Payer: Medicaid Other

## 2016-08-25 DIAGNOSIS — Z9119 Patient's noncompliance with other medical treatment and regimen: Secondary | ICD-10-CM

## 2016-08-25 DIAGNOSIS — R451 Restlessness and agitation: Secondary | ICD-10-CM | POA: Diagnosis present

## 2016-08-25 DIAGNOSIS — F1020 Alcohol dependence, uncomplicated: Secondary | ICD-10-CM | POA: Diagnosis present

## 2016-08-25 DIAGNOSIS — R509 Fever, unspecified: Secondary | ICD-10-CM | POA: Diagnosis present

## 2016-08-25 DIAGNOSIS — Z91030 Bee allergy status: Secondary | ICD-10-CM

## 2016-08-25 DIAGNOSIS — Z9114 Patient's other noncompliance with medication regimen: Secondary | ICD-10-CM

## 2016-08-25 DIAGNOSIS — Z8249 Family history of ischemic heart disease and other diseases of the circulatory system: Secondary | ICD-10-CM

## 2016-08-25 DIAGNOSIS — G40909 Epilepsy, unspecified, not intractable, without status epilepticus: Secondary | ICD-10-CM | POA: Diagnosis not present

## 2016-08-25 DIAGNOSIS — Z781 Physical restraint status: Secondary | ICD-10-CM

## 2016-08-25 DIAGNOSIS — F1010 Alcohol abuse, uncomplicated: Secondary | ICD-10-CM

## 2016-08-25 DIAGNOSIS — Z96642 Presence of left artificial hip joint: Secondary | ICD-10-CM | POA: Diagnosis present

## 2016-08-25 DIAGNOSIS — R569 Unspecified convulsions: Secondary | ICD-10-CM

## 2016-08-25 DIAGNOSIS — D6959 Other secondary thrombocytopenia: Secondary | ICD-10-CM | POA: Diagnosis present

## 2016-08-25 DIAGNOSIS — F17210 Nicotine dependence, cigarettes, uncomplicated: Secondary | ICD-10-CM | POA: Diagnosis present

## 2016-08-25 DIAGNOSIS — R945 Abnormal results of liver function studies: Secondary | ICD-10-CM

## 2016-08-25 DIAGNOSIS — K219 Gastro-esophageal reflux disease without esophagitis: Secondary | ICD-10-CM | POA: Diagnosis present

## 2016-08-25 DIAGNOSIS — Z8659 Personal history of other mental and behavioral disorders: Secondary | ICD-10-CM | POA: Diagnosis not present

## 2016-08-25 DIAGNOSIS — F10239 Alcohol dependence with withdrawal, unspecified: Secondary | ICD-10-CM | POA: Diagnosis present

## 2016-08-25 DIAGNOSIS — E162 Hypoglycemia, unspecified: Secondary | ICD-10-CM | POA: Diagnosis not present

## 2016-08-25 DIAGNOSIS — E876 Hypokalemia: Secondary | ICD-10-CM | POA: Diagnosis present

## 2016-08-25 DIAGNOSIS — R7989 Other specified abnormal findings of blood chemistry: Secondary | ICD-10-CM

## 2016-08-25 DIAGNOSIS — K7010 Alcoholic hepatitis without ascites: Secondary | ICD-10-CM | POA: Diagnosis present

## 2016-08-25 DIAGNOSIS — Z96649 Presence of unspecified artificial hip joint: Secondary | ICD-10-CM

## 2016-08-25 DIAGNOSIS — Z91199 Patient's noncompliance with other medical treatment and regimen due to unspecified reason: Secondary | ICD-10-CM

## 2016-08-25 DIAGNOSIS — R740 Nonspecific elevation of levels of transaminase and lactic acid dehydrogenase [LDH]: Secondary | ICD-10-CM | POA: Diagnosis present

## 2016-08-25 DIAGNOSIS — F200 Paranoid schizophrenia: Secondary | ICD-10-CM | POA: Diagnosis present

## 2016-08-25 DIAGNOSIS — Z833 Family history of diabetes mellitus: Secondary | ICD-10-CM

## 2016-08-25 DIAGNOSIS — R079 Chest pain, unspecified: Secondary | ICD-10-CM | POA: Diagnosis present

## 2016-08-25 DIAGNOSIS — Z79899 Other long term (current) drug therapy: Secondary | ICD-10-CM

## 2016-08-25 DIAGNOSIS — F10939 Alcohol use, unspecified with withdrawal, unspecified: Secondary | ICD-10-CM

## 2016-08-25 LAB — CBC
HEMATOCRIT: 35.4 % — AB (ref 39.0–52.0)
HEMOGLOBIN: 11.9 g/dL — AB (ref 13.0–17.0)
MCH: 31.8 pg (ref 26.0–34.0)
MCHC: 33.6 g/dL (ref 30.0–36.0)
MCV: 94.7 fL (ref 78.0–100.0)
Platelets: 160 10*3/uL (ref 150–400)
RBC: 3.74 MIL/uL — AB (ref 4.22–5.81)
RDW: 15.1 % (ref 11.5–15.5)
WBC: 5.7 10*3/uL (ref 4.0–10.5)

## 2016-08-25 LAB — BASIC METABOLIC PANEL
Anion gap: 20 — ABNORMAL HIGH (ref 5–15)
Anion gap: 11 (ref 5–15)
BUN: 7 mg/dL (ref 6–20)
CHLORIDE: 103 mmol/L (ref 101–111)
CO2: 17 mmol/L — ABNORMAL LOW (ref 22–32)
CO2: 23 mmol/L (ref 22–32)
Calcium: 9.3 mg/dL (ref 8.9–10.3)
Calcium: 8.5 mg/dL — ABNORMAL LOW (ref 8.9–10.3)
Chloride: 104 mmol/L (ref 101–111)
Creatinine, Ser: 0.96 mg/dL (ref 0.61–1.24)
Creatinine, Ser: 0.77 mg/dL (ref 0.61–1.24)
GFR calc Af Amer: >60 mL/min (ref >60)
GFR calc Af Amer: >60 mL/min (ref >60)
GFR calc non Af Amer: >60 mL/min (ref >60)
Glucose, Bld: 131 mg/dL — ABNORMAL HIGH (ref 65–99)
Glucose, Bld: 102 mg/dL — ABNORMAL HIGH (ref 65–99)
POTASSIUM: 4.7 mmol/L (ref 3.5–5.1)
POTASSIUM: 3.5 mmol/L (ref 3.5–5.1)
SODIUM: 138 mmol/L (ref 135–145)
Sodium: 140 mmol/L (ref 135–145)

## 2016-08-25 LAB — HEPATIC FUNCTION PANEL
ALK PHOS: 66 U/L (ref 38–126)
ALT: 64 U/L — ABNORMAL HIGH (ref 17–63)
AST: 205 U/L — ABNORMAL HIGH (ref 15–41)
Albumin: 4.1 g/dL (ref 3.5–5.0)
BILIRUBIN INDIRECT: 0.1 mg/dL — AB (ref 0.3–0.9)
Bilirubin, Direct: 0.3 mg/dL (ref 0.1–0.5)
TOTAL PROTEIN: 8.4 g/dL — AB (ref 6.5–8.1)
Total Bilirubin: 0.4 mg/dL (ref 0.3–1.2)

## 2016-08-25 LAB — PHENYTOIN LEVEL, TOTAL

## 2016-08-25 MED ORDER — ONDANSETRON HCL 4 MG/2ML IJ SOLN
4.0000 mg | Freq: Once | INTRAMUSCULAR | Status: AC | PRN
  Administered 2016-08-25: 4 mg via INTRAVENOUS
  Filled 2016-08-25: qty 2

## 2016-08-25 MED ORDER — LORAZEPAM 1 MG PO TABS
1.0000 mg | ORAL_TABLET | Freq: Four times a day (QID) | ORAL | Status: DC | PRN
  Administered 2016-08-25: 1 mg via ORAL
  Filled 2016-08-25 (×3): qty 1

## 2016-08-25 MED ORDER — SODIUM CHLORIDE 0.9 % IV BOLUS (SEPSIS)
1000.0000 mL | Freq: Once | INTRAVENOUS | Status: AC
  Administered 2016-08-25: 1000 mL via INTRAVENOUS

## 2016-08-25 MED ORDER — LORAZEPAM 2 MG/ML IJ SOLN
1.0000 mg | Freq: Once | INTRAMUSCULAR | Status: AC
  Administered 2016-08-25: 1 mg via INTRAVENOUS
  Filled 2016-08-25: qty 1

## 2016-08-25 MED ORDER — THIAMINE HCL 100 MG/ML IJ SOLN
100.0000 mg | Freq: Every day | INTRAMUSCULAR | Status: DC
  Administered 2016-08-25 – 2016-08-28 (×2): 100 mg via INTRAVENOUS
  Filled 2016-08-25 (×2): qty 2

## 2016-08-25 MED ORDER — ACETAMINOPHEN 325 MG PO TABS: 650.0000 mg | ORAL_TABLET | ORAL | Status: DC | PRN

## 2016-08-25 MED ORDER — ONDANSETRON HCL 4 MG/2ML IJ SOLN: 4.0000 mg | Freq: Four times a day (QID) | INTRAMUSCULAR | Status: DC | PRN

## 2016-08-25 MED ORDER — LORAZEPAM 2 MG/ML IJ SOLN
0.0000 mg | Freq: Two times a day (BID) | INTRAMUSCULAR | Status: DC
Start: 2016-08-27 — End: 2016-08-26

## 2016-08-25 MED ORDER — PHENYTOIN SODIUM EXTENDED 100 MG PO CAPS: 200.0000 mg | ORAL_CAPSULE | Freq: Every day | ORAL | Status: DC

## 2016-08-25 MED ORDER — PHENYTOIN SODIUM EXTENDED 100 MG PO CAPS
100.0000 mg | ORAL_CAPSULE | Freq: Every day | ORAL | Status: DC
Start: 2016-08-26 — End: 2016-08-27
  Administered 2016-08-26: 100 mg via ORAL
  Filled 2016-08-25 (×2): qty 1

## 2016-08-25 MED ORDER — PHENYTOIN SODIUM EXTENDED 200 MG PO CAPS: 200.0000 mg | ORAL_CAPSULE | Freq: Every day | ORAL | 0 refills | Status: DC

## 2016-08-25 MED ORDER — ADULT MULTIVITAMIN W/MINERALS CH
1.0000 | ORAL_TABLET | Freq: Every day | ORAL | Status: DC
  Administered 2016-08-25 – 2016-08-29 (×4): 1 via ORAL
  Filled 2016-08-25 (×4): qty 1

## 2016-08-25 MED ORDER — ACETAMINOPHEN 650 MG RE SUPP: 650.0000 mg | RECTAL | Status: DC | PRN

## 2016-08-25 MED ORDER — ONDANSETRON HCL 4 MG PO TABS: 4.0000 mg | ORAL_TABLET | Freq: Four times a day (QID) | ORAL | Status: DC | PRN

## 2016-08-25 MED ORDER — VITAMIN B-1 100 MG PO TABS
100.0000 mg | ORAL_TABLET | Freq: Every day | ORAL | Status: DC
  Administered 2016-08-26 – 2016-08-29 (×2): 100 mg via ORAL
  Filled 2016-08-25 (×2): qty 1

## 2016-08-25 MED ORDER — SODIUM CHLORIDE 0.9 % IV SOLN
1500.0000 mg | Freq: Once | INTRAVENOUS | Status: AC
  Administered 2016-08-25: 1500 mg via INTRAVENOUS
  Filled 2016-08-25: qty 30

## 2016-08-25 MED ORDER — PHENYTOIN SODIUM EXTENDED 100 MG PO CAPS
200.0000 mg | ORAL_CAPSULE | Freq: Every day | ORAL | Status: DC
  Administered 2016-08-26: 200 mg via ORAL
  Filled 2016-08-25: qty 2

## 2016-08-25 MED ORDER — FOLIC ACID 1 MG PO TABS
1.0000 mg | ORAL_TABLET | Freq: Every day | ORAL | Status: DC
  Administered 2016-08-25 – 2016-08-29 (×4): 1 mg via ORAL
  Filled 2016-08-25 (×4): qty 1

## 2016-08-25 MED ORDER — LORAZEPAM 2 MG/ML IJ SOLN
0.0000 mg | Freq: Four times a day (QID) | INTRAMUSCULAR | Status: DC
  Administered 2016-08-26: 2 mg via INTRAVENOUS
  Filled 2016-08-25 (×2): qty 1

## 2016-08-25 MED ORDER — LEVETIRACETAM 500 MG PO TABS
500.0000 mg | ORAL_TABLET | Freq: Two times a day (BID) | ORAL | Status: DC
  Administered 2016-08-26 (×3): 500 mg via ORAL
  Filled 2016-08-25 (×3): qty 1

## 2016-08-25 MED ORDER — LORAZEPAM 2 MG/ML IJ SOLN
1.0000 mg | Freq: Four times a day (QID) | INTRAMUSCULAR | Status: DC | PRN
  Administered 2016-08-25: 1 mg via INTRAVENOUS

## 2016-08-25 MED ORDER — DIPHENHYDRAMINE HCL 25 MG PO CAPS: 25.0000 mg | ORAL_CAPSULE | Freq: Four times a day (QID) | ORAL | Status: DC | PRN

## 2016-08-25 MED ORDER — PHENYTOIN SODIUM EXTENDED 100 MG PO CAPS: 100.0000 mg | ORAL_CAPSULE | Freq: Every day | ORAL | 0 refills | Status: DC

## 2016-08-25 MED ORDER — LEVETIRACETAM 500 MG PO TABS: 500.0000 mg | ORAL_TABLET | Freq: Two times a day (BID) | ORAL | 0 refills | Status: DC

## 2016-08-25 NOTE — ED Provider Notes (Signed)
[facility]-EMERGENCY DEPT Provider Note   CSN: [id number] Arrival date & time: 08/25/16  1252     History   Chief Complaint Chief Complaint  Patient presents with  . Seizures  . Fall    HPI Jenny Reichmannhillip Laye is a 40 y.o. male.  HPI  Patient with hx paranoid schizophrenia, alcohol abuse, seizures brought in by EMS after [facility].  Pt has been vomiting since yesterday, associated abdominal cramping.  [facility], last ETOH was yesterday.  Does not take his seizure medication dilantin.  Currently only complains of pain in the abdomen.    Past Medical History:  Diagnosis Date  . Arthritis    hands  . Avascular necrosis (HCC)    L HIP  . Avascular necrosis of hip (HCC)    left  . Schizophrenia (HCC)   . Seizures (HCC)    HX [place] seiaures - last seizure 6-7 months ago  . Substance abuse    has not used for 5-6 yrs    Patient Active Problem List   Diagnosis Date Noted  . Alcohol withdrawal (HCC) 08/25/2016  . Paranoid schizophrenia (HCC) 08/11/2015  . Seizure disorder (HCC) 08/10/2015  . Alcohol use disorder, severe, dependence (HCC) 08/10/2015  . Hx of schizophrenia   . Expected blood loss anemia 04/30/2013  . Overweight (BMI 25.0-29.9) 04/30/2013  . S/P left THA, AA 04/29/2013    Past Surgical History:  Procedure Laterality Date  . NO PAST SURGERIES    . TOTAL HIP ARTHROPLASTY Left 04/29/2013   Procedure: TOTAL HIP ARTHROPLASTY ANTERIOR APPROACH;  Surgeon: Shelda PalMatthew D Olin, MD;  Location: [facility] ORS;  Service: Orthopedics;  Laterality: Left;       Home Medications    Prior to Admission medications   Medication Sig Start Date End Date Taking? Authorizing Provider  benztropine (COGENTIN) 1 MG tablet Take 1 tablet (1 mg total) by mouth 2 (two) times daily. Patient not taking: Reported on 12/04/2015 08/16/15   Thermon LeylandLaura A Davis, NP  cyclobenzaprine (FLEXERIL) 10 MG tablet Take 1 tablet (10 mg total) by mouth 3 (three) times  daily as needed for muscle spasms (or pain). Patient not taking: Reported on 08/25/2016 04/26/16   Trixie DredgeEmily Sharai Overbay, PA-C  Docusate Sodium (DSS) 100 MG CAPS Take 100 mg by mouth 2 (two) times daily. Patient not taking: Reported on 12/04/2015 08/16/15   Thermon LeylandLaura A Davis, NP  EPINEPHrine 0.3 mg/0.3 mL IJ SOAJ injection Inject 0.3 mLs (0.3 mg total) into the muscle as needed. Patient not taking: Reported on 12/04/2015 08/16/15   Thermon LeylandLaura A Davis, NP  haloperidol (HALDOL) 10 MG tablet Take one tablet (10 mg) at bedtime. Patient not taking: Reported on 12/04/2015 08/16/15   Thermon LeylandLaura A Davis, NP  haloperidol (HALDOL) 5 MG tablet Take 1 tablet (5 mg total) by mouth daily with breakfast. Patient not taking: Reported on 12/04/2015 08/16/15   Thermon LeylandLaura A Davis, NP  haloperidol decanoate (HALDOL DECANOATE) 100 MG/ML injection Inject 0.5 mLs (50 mg total) into the muscle every 21 ( twenty-one) days. Patient not taking: Reported on 12/04/2015 09/06/15   Thermon LeylandLaura A Davis, NP  ibuprofen (ADVIL,MOTRIN) 800 MG tablet Take 1 tablet (800 mg total) by mouth every 8 (eight) hours as needed for mild pain or moderate pain. Patient not taking: Reported on 08/25/2016 04/26/16   Trixie DredgeEmily Calaya Gildner, PA-C  levETIRAcetam (KEPPRA) 500 MG tablet Take 1 tablet (500 mg total) by mouth 2 (two) times daily. 08/25/16  Trixie Dredge, PA-C  phenytoin (DILANTIN) 100 MG ER capsule Take 1 capsule (100 mg total) by mouth daily. 08/25/16   Trixie Dredge, PA-C  phenytoin (DILANTIN) 200 MG ER capsule Take 1 capsule (200 mg total) by mouth at bedtime. 08/25/16   Trixie Dredge, PA-C  traZODone (DESYREL) 50 MG tablet Take 1 tablet (50 mg total) by mouth at bedtime. Patient not taking: Reported on 12/04/2015 08/16/15   Thermon Leyland, NP    Family History Family History  Problem Relation Age of Onset  . Cancer Brother   . Diabetes Mother   . Hypertension Mother   . Cancer Father   . Hypertension Father   . Hypertension Sister   . Diabetes Maternal Grandmother     Social History Social  History  Substance Use Topics  . Smoking status: Current Every Day Smoker    Packs/day: 1.50    Years: 5.00    Types: Cigarettes  . Smokeless tobacco: Never Used  . Alcohol use 3.6 oz/week    6 Cans of beer per week     Comment: Pt. states that he drinks beer every couple of days 2 (12 oz cans)     Allergies   Bee venom   Review of Systems Review of Systems  Unable to perform ROS: Mental status change     Physical Exam Updated Vital Signs BP 108/73   Pulse 72   Temp 98.6 F (37 C) (Oral)   Resp 18   Ht 5\' 8"  (1.727 m)   Wt 68 kg   SpO2 98%   BMI 22.81 kg/m   Physical Exam  Constitutional: He appears well-developed and well-nourished. No distress.  HENT:  Head: Normocephalic and atraumatic.  Neck: Neck supple.  Cardiovascular: Normal rate and regular rhythm.   Pulmonary/Chest: Effort normal and breath sounds normal. No respiratory distress. He has no wheezes. He has no rales.  Abdominal: Soft. He exhibits no distension and no mass. There is no tenderness. There is no rebound and no guarding.  Neurological: He is alert. He exhibits normal muscle tone.  Knows he is in a hospital in [place], does not know which one.  Has been told and retains what happened to him.  Remembers the events leading up to the seizure.  Thinks it is Monday.  Will not guess the month or year.  Thinks Obama is president.    Has tremor when lifting hand  Skin: He is not diaphoretic.  Nursing note and vitals reviewed.    ED Treatments / Results  Labs (all labs ordered are listed, but only abnormal results are displayed) Labs Reviewed  BASIC METABOLIC PANEL - Abnormal; Notable for the following:       Result Value   CO2 17 (*)    Glucose, Bld 131 (*)    Anion gap 20 (*)    All other components within normal limits  PHENYTOIN LEVEL, TOTAL - Abnormal; Notable for the following:    Phenytoin Lvl <2.5 (*)    All other components within normal limits  CBC - Abnormal; Notable for the  following:    RBC 3.74 (*)    Hemoglobin 11.9 (*)    HCT 35.4 (*)    All other components within normal limits  HEPATIC FUNCTION PANEL - Abnormal; Notable for the following:    Total Protein 8.4 (*)    AST 205 (*)    ALT 64 (*)    Indirect Bilirubin 0.1 (*)    All other components within  normal limits  BASIC METABOLIC PANEL - Abnormal; Notable for the following:    Glucose, Bld 102 (*)    BUN <5 (*)    Calcium 8.5 (*)    All other components within normal limits    EKG  EKG Interpretation  Date/Time:  Friday August 25 2016 13:06:12 EDT Ventricular Rate:  126 PR Interval:    QRS Duration: 86 QT Interval:  310 QTC Calculation: 449 R Axis:   72 Text Interpretation:  Sinus tachycardia Confirmed by Fayrene Fearing  MD, MARK ([id number]) on 08/25/2016 1:13:41 PM       Radiology Ct Head Wo Contrast  Result Date: 08/25/2016 CLINICAL DATA:  Seizure.  Fall. EXAM: CT HEAD WITHOUT CONTRAST TECHNIQUE: Contiguous axial images were obtained from the base of the skull through the vertex without intravenous contrast. COMPARISON:  05/23/2016 FINDINGS: CT HEAD FINDINGS Brain: Cerebral and cerebellar atrophy for age. No mass lesion, hemorrhage, hydrocephalus, acute infarct, intra-axial, or extra-axial fluid collection. Vascular: No hyperdense vessel or unexpected calcification. Skull: No significant soft tissue swelling.  No skull fracture. Sinuses/Orbits: Normal orbits and globes. Clear paranasal sinuses and mastoid air cells. Other: None CT CERVICAL SPINE FINDINGS Spinal visualization through the bottom of T1. Prevertebral soft tissues are within normal limits. No apical pneumothorax. Skull base intact. Maintenance of vertebral body height. Straightening of expected cervical lordosis. Degenerative disc disease and spondylosis at C4-5 and 5-6. Facets are well-aligned. Coronal reformats demonstrate a normal C1-C2 articulation. IMPRESSION: 1. No acute intracranial abnormality. Age advanced cerebral and cerebellar  atrophy. 2. No acute fracture or subluxation in the cervical spine. Straightening of expected cervical lordosis could be positional, due to muscular spasm, or ligamentous injury. Electronically Signed   By: Jeronimo Greaves M.D.   On: 08/25/2016 16:49   Ct Cervical Spine Wo Contrast  Result Date: 08/25/2016 CLINICAL DATA:  Seizure.  Fall. EXAM: CT HEAD WITHOUT CONTRAST TECHNIQUE: Contiguous axial images were obtained from the base of the skull through the vertex without intravenous contrast. COMPARISON:  05/23/2016 FINDINGS: CT HEAD FINDINGS Brain: Cerebral and cerebellar atrophy for age. No mass lesion, hemorrhage, hydrocephalus, acute infarct, intra-axial, or extra-axial fluid collection. Vascular: No hyperdense vessel or unexpected calcification. Skull: No significant soft tissue swelling.  No skull fracture. Sinuses/Orbits: Normal orbits and globes. Clear paranasal sinuses and mastoid air cells. Other: None CT CERVICAL SPINE FINDINGS Spinal visualization through the bottom of T1. Prevertebral soft tissues are within normal limits. No apical pneumothorax. Skull base intact. Maintenance of vertebral body height. Straightening of expected cervical lordosis. Degenerative disc disease and spondylosis at C4-5 and 5-6. Facets are well-aligned. Coronal reformats demonstrate a normal C1-C2 articulation. IMPRESSION: 1. No acute intracranial abnormality. Age advanced cerebral and cerebellar atrophy. 2. No acute fracture or subluxation in the cervical spine. Straightening of expected cervical lordosis could be positional, due to muscular spasm, or ligamentous injury. Electronically Signed   By: Jeronimo Greaves M.D.   On: 08/25/2016 16:49    Procedures Procedures (including critical care time)  Medications Ordered in ED Medications  LORazepam (ATIVAN) tablet 1 mg (1 mg Oral Given 08/25/16 2026)    Or  LORazepam (ATIVAN) injection 1 mg ( Intravenous See Alternative 08/25/16 2026)  thiamine (VITAMIN B-1) tablet 100 mg (  Oral See Alternative 08/25/16 1457)    Or  thiamine (B-1) injection 100 mg (100 mg Intravenous Given 08/25/16 1457)  folic acid (FOLVITE) tablet 1 mg (1 mg Oral Given 08/25/16 1456)  multivitamin with minerals tablet 1 tablet (1 tablet Oral Given  08/25/16 1456)  LORazepam (ATIVAN) injection 0-4 mg (not administered)    Followed by  LORazepam (ATIVAN) injection 0-4 mg (not administered)  ondansetron (ZOFRAN) injection 4 mg (4 mg Intravenous Given 08/25/16 1338)  LORazepam (ATIVAN) injection 1 mg (1 mg Intravenous Given 08/25/16 1347)  sodium chloride 0.9 % bolus 1,000 mL (0 mLs Intravenous Stopped 08/25/16 1533)  sodium chloride 0.9 % bolus 1,000 mL (0 mLs Intravenous Stopped 08/25/16 1907)  phenytoin (DILANTIN) 1,500 mg in sodium chloride 0.9 % 250 mL IVPB (0 mg Intravenous Stopped 08/25/16 1650)     Initial Impression / Assessment and Plan / ED Course  I have reviewed the triage vital signs and the nursing notes.  Pertinent labs & imaging results that were available during my care of the patient were reviewed by me and considered in my medical decision making (see chart for details).  Clinical Course  Comment By Time  Family is bedside. Trixie Dredge, PA-C 09/01 1630  Family bedside.  They state he seems back to normal.  State he wouldn't typically know who the president is.   Trixie Dredge, PA-C 09/01 1636  Prior to discharge patient attempted to get himself out of bed (with rails up) and fell.  He denies any injury.  He appears to be shaky, have requested PO ativan and new CIWA from nurse.  Will attempt to improve symptoms prior to discharge.  Pt speaking clearly, has clear memory of the events since his arrival in ED, and eager to go home.   Trixie Dredge, PA-C 09/01 2005  Family back in room.  Pt is not acting like himself - is quieter than usual.  He is very shaky.  Doesn't know what day it is or things he might typically know.  He lives alone, would like for him to be admitted overnight.   Trixie Dredge, PA-C  09/01 2156    Patient with hx seizures, noncompliance, alcohol abuse p/w alcohol withdrawal and seizure.  Unclear if seizure from alcohol withdrawal or noncompliance.  Pt is supposed to be on dilantin and keppra, dilantin level <2.  Loaded dilantin in ED.  Pt shaky throughout ED visit, unable to put his shoe on or walk. Remained on CIWA.  Pt not quite back to baseline. Discussed pt at length with family members and for safety reasons pt admitted to the hospital for observation overnight.  Pt admitted to [facility], Dr Michael Litter accepting.    Final Clinical Impressions(s) / ED Diagnoses   Final diagnoses:  Seizure (HCC)  Noncompliance with medication regimen  Alcohol abuse  Elevated LFTs  Seizures (HCC)  Alcohol withdrawal, with unspecified complication [facility])    New Prescriptions Current Discharge Medication List       Trixie Dredge, [place] 08/25/16 2230    Rolland Porter, MD 09/05/16 2349

## 2016-08-25 NOTE — H&P (Signed)
History and Physical    [name] WUJ:[id number] DOB: 1976-11-26 DOA: 08/25/2016  PCP: Nilda Simmer, MD   Patient coming from: Home  Chief Complaint: Witnessed seizure activity today  HPI: [name] is a 40 y.o. gentleman with a history of alcohol dependence, seizure disorder, schizophrenia, and medical noncompliance who was brought to the ED after having several witnessed seizures in the [facility] store.  He is currently disoriented, but he is accompanied by his sister Denyse Amass who shares medical POA for the patient with another sibling.  She reports that the patient used to live with her, but he is now living independently in an apartment with a room mate.  She reports that he told her that he had not been feeling well for the past several days.  She is highly suspicious that he has been noncompliant with his home medications.  He has reported nausea and vomiting for the past two days.  No fevers, chills, or sweats.  No diarrhea.  No dysuria.  He has intermittent burning chest pain that he associates with acid reflux.  No shortness of breath or syncope.  His sister thinks that his last drink was sometime yesterday.  He admits to a history of EtOH withdrawal.  ED Course: Dilantin level undetectable.  CT of head and neck do not show acute findings.  He received dilantin load 1500mg  IV x one and IV ativan x 2.  He has received 2L of NS.  CIWA protocol initiated.  Hospitalist asked to admit.  Review of Systems: As per HPI otherwise 10 point review of systems negative.    Past Medical History:  Diagnosis Date  . Arthritis    hands  . Avascular necrosis (HCC)    L HIP  . Avascular necrosis of hip (HCC)    left  . Schizophrenia (HCC)   . Seizures (HCC)    HX [place] seiaures - last seizure 6-7 months ago  . Substance abuse    has not used for 5-6 yrs    Past Surgical History:  Procedure Laterality Date  . NO PAST SURGERIES    . TOTAL HIP ARTHROPLASTY Left 04/29/2013   Procedure:  TOTAL HIP ARTHROPLASTY ANTERIOR APPROACH;  Surgeon: Shelda Pal, MD;  Location: [facility] ORS;  Service: Orthopedics;  Laterality: Left;     reports that he has been smoking Cigarettes.  He has a 7.50 pack-year smoking history. He has never used smokeless tobacco. He reports that he drinks about 3.6 oz of alcohol per week . He reports that he does not use drugs.  He is not married.  He has children.  Allergies  Allergen Reactions  . Bee Venom Anaphylaxis    Family History  Problem Relation Age of Onset  . Cancer Brother   . Diabetes Mother   . Hypertension Mother   . Cancer Father   . Hypertension Father   . Hypertension Sister   . Diabetes Maternal Grandmother      Prior to Admission medications   Medication Sig Start Date End Date Taking? Authorizing Provider  benztropine (COGENTIN) 1 MG tablet Take 1 tablet (1 mg total) by mouth 2 (two) times daily. Patient not taking: Reported on 12/04/2015 08/16/15   Thermon Leyland, NP  cyclobenzaprine (FLEXERIL) 10 MG tablet Take 1 tablet (10 mg total) by mouth 3 (three) times daily as needed for muscle spasms (or pain). Patient not taking: Reported on 08/25/2016 04/26/16   Trixie Dredge, PA-C  Docusate Sodium (DSS) 100 MG CAPS Take  100 mg by mouth 2 (two) times daily. Patient not taking: Reported on 12/04/2015 08/16/15   Thermon Leyland, NP  EPINEPHrine 0.3 mg/0.3 mL IJ SOAJ injection Inject 0.3 mLs (0.3 mg total) into the muscle as needed. Patient not taking: Reported on 12/04/2015 08/16/15   Thermon Leyland, NP  haloperidol (HALDOL) 10 MG tablet Take one tablet (10 mg) at bedtime. Patient not taking: Reported on 12/04/2015 08/16/15   Thermon Leyland, NP  haloperidol (HALDOL) 5 MG tablet Take 1 tablet (5 mg total) by mouth daily with breakfast. Patient not taking: Reported on 12/04/2015 08/16/15   Thermon Leyland, NP  haloperidol decanoate (HALDOL DECANOATE) 100 MG/ML injection Inject 0.5 mLs (50 mg total) into the muscle every 21 ( twenty-one) days. Patient not  taking: Reported on 12/04/2015 09/06/15   Thermon Leyland, NP  ibuprofen (ADVIL,MOTRIN) 800 MG tablet Take 1 tablet (800 mg total) by mouth every 8 (eight) hours as needed for mild pain or moderate pain. Patient not taking: Reported on 08/25/2016 04/26/16   Trixie Dredge, PA-C  levETIRAcetam (KEPPRA) 500 MG tablet Take 1 tablet (500 mg total) by mouth 2 (two) times daily. 08/25/16   Trixie Dredge, PA-C  phenytoin (DILANTIN) 100 MG ER capsule Take 1 capsule (100 mg total) by mouth daily. 08/25/16   Trixie Dredge, PA-C  phenytoin (DILANTIN) 200 MG ER capsule Take 1 capsule (200 mg total) by mouth at bedtime. 08/25/16   Trixie Dredge, PA-C  traZODone (DESYREL) 50 MG tablet Take 1 tablet (50 mg total) by mouth at bedtime. Patient not taking: Reported on 12/04/2015 08/16/15   Thermon Leyland, NP    Physical Exam: Vitals:   08/25/16 1735 08/25/16 2024 08/25/16 2024 08/25/16 2205  BP: (!) 136/103 115/92 115/92 108/73  Pulse: 115 93 93 72  Resp: 21 20  18   Temp:      TempSrc:      SpO2: 92% 98%  98%  Weight:      Height:          Constitutional: NAD, calm, sleeping but arousable though he is still disoriented Vitals:   08/25/16 1735 08/25/16 2024 08/25/16 2024 08/25/16 2205  BP: (!) 136/103 115/92 115/92 108/73  Pulse: 115 93 93 72  Resp: 21 20  18   Temp:      TempSrc:      SpO2: 92% 98%  98%  Weight:      Height:       Eyes: PERRL, lids and conjunctivae normal, no nystagmus ENMT: Mucous membranes are dry. No nasal drainage.  Neck: normal appearance, supple, no masses Respiratory: clear to auscultation bilaterally, no wheezing, no crackles. Normal respiratory effort. No accessory muscle use.  Cardiovascular: Normal rate, regular rhythm, no murmurs / rubs / gallops. No extremity edema. 2+ pedal pulses. No carotid bruits.  GI: abdomen is soft and compressible.  No distention.  No tenderness.  No masses palpated.  Bowel sounds are present. Musculoskeletal:  No joint deformity in upper and lower extremities. Good  ROM, no contractures. Normal muscle tone.  Skin: no rashes, warm and dry Neurologic: No focal deficits.  Strength symmetric bilaterally. Psychiatric: Normal judgment and insight. Alert and oriented x 3. Normal mood.     Labs on Admission: I have personally reviewed following labs and imaging studies  CBC:  Recent Labs Lab 08/25/16 1338  WBC 5.7  HGB 11.9*  HCT 35.4*  MCV 94.7  PLT 160   Basic Metabolic Panel:  Recent Labs Lab 08/25/16  1338 08/25/16 1730  NA 140 138  K 4.7 3.5  CL 103 104  CO2 17* 23  GLUCOSE 131* 102*  BUN 7 <5*  CREATININE 0.96 0.77  CALCIUM 9.3 8.5*   GFR: Estimated Creatinine Clearance: 119.2 mL/min (by C-G formula based on SCr of 0.8 mg/dL). Liver Function Tests:  Recent Labs Lab 08/25/16 1338  AST 205*  ALT 64*  ALKPHOS 66  BILITOT 0.4  PROT 8.4*  ALBUMIN 4.1   Dilantin level undetectable in the ED  Radiological Exams on Admission: Ct Head Wo Contrast  Result Date: 08/25/2016 CLINICAL DATA:  Seizure.  Fall. EXAM: CT HEAD WITHOUT CONTRAST TECHNIQUE: Contiguous axial images were obtained from the base of the skull through the vertex without intravenous contrast. COMPARISON:  05/23/2016 FINDINGS: CT HEAD FINDINGS Brain: Cerebral and cerebellar atrophy for age. No mass lesion, hemorrhage, hydrocephalus, acute infarct, intra-axial, or extra-axial fluid collection. Vascular: No hyperdense vessel or unexpected calcification. Skull: No significant soft tissue swelling.  No skull fracture. Sinuses/Orbits: Normal orbits and globes. Clear paranasal sinuses and mastoid air cells. Other: None CT CERVICAL SPINE FINDINGS Spinal visualization through the bottom of T1. Prevertebral soft tissues are within normal limits. No apical pneumothorax. Skull base intact. Maintenance of vertebral body height. Straightening of expected cervical lordosis. Degenerative disc disease and spondylosis at C4-5 and 5-6. Facets are well-aligned. Coronal reformats demonstrate a  normal C1-C2 articulation. IMPRESSION: 1. No acute intracranial abnormality. Age advanced cerebral and cerebellar atrophy. 2. No acute fracture or subluxation in the cervical spine. Straightening of expected cervical lordosis could be positional, due to muscular spasm, or ligamentous injury. Electronically Signed   By: Jeronimo Greaves M.D.   On: 08/25/2016 16:49   Ct Cervical Spine Wo Contrast  Result Date: 08/25/2016 CLINICAL DATA:  Seizure.  Fall. EXAM: CT HEAD WITHOUT CONTRAST TECHNIQUE: Contiguous axial images were obtained from the base of the skull through the vertex without intravenous contrast. COMPARISON:  05/23/2016 FINDINGS: CT HEAD FINDINGS Brain: Cerebral and cerebellar atrophy for age. No mass lesion, hemorrhage, hydrocephalus, acute infarct, intra-axial, or extra-axial fluid collection. Vascular: No hyperdense vessel or unexpected calcification. Skull: No significant soft tissue swelling.  No skull fracture. Sinuses/Orbits: Normal orbits and globes. Clear paranasal sinuses and mastoid air cells. Other: None CT CERVICAL SPINE FINDINGS Spinal visualization through the bottom of T1. Prevertebral soft tissues are within normal limits. No apical pneumothorax. Skull base intact. Maintenance of vertebral body height. Straightening of expected cervical lordosis. Degenerative disc disease and spondylosis at C4-5 and 5-6. Facets are well-aligned. Coronal reformats demonstrate a normal C1-C2 articulation. IMPRESSION: 1. No acute intracranial abnormality. Age advanced cerebral and cerebellar atrophy. 2. No acute fracture or subluxation in the cervical spine. Straightening of expected cervical lordosis could be positional, due to muscular spasm, or ligamentous injury. Electronically Signed   By: Jeronimo Greaves M.D.   On: 08/25/2016 16:49    EKG: Independently reviewed. Sinus tachycardia.  Assessment/Plan Principal Problem:   Seizure disorder (HCC) Active Problems:   S/P left THA, AA   Alcohol use disorder,  severe, dependence (HCC)   Hx of schizophrenia   Alcohol withdrawal (HCC)   H/O noncompliance with medical treatment, presenting hazards to health   Seizure [facility])      Witnesses seizure, history of medical noncompliance --S/P dilantin load in the ED --Resume home doses of oral dilantin and keppra --Seizure precautions --Consider neurology consult in the ED --IV ativan prn for active seizure --U/A and urine drug screen pending  EtOH dependence --If  his last drink was less than 24 hours ago, I would not expect him to be in withdrawal this soon. --CIWA protocol  History of schizophrenia, medical noncompliance --[facility].  Holding home medications for now.  History of nausea and vomiting, possible gastroenteritis.  Patient tells me that he thinks he could eat something now. --Monitor symptoms --diet as tolerated  DVT prophylaxis: SCDs Code Status: FULL  Family Communication: Sister Elinor DodgeGwendolyn present at bedside in the ED at the time of admission Disposition Plan: To be determined Consults called: NONE but will need to consider neurology consultation in the AM and Behavioral Health referral once medically cleared Admission status: Stepdown, observation   TIME SPENT: 70 minutes   Jerene Bearsarter,Areeba Sulser Harrison MD [facility] Pager [phone]  If 7PM-7AM, please contact night-coverage [email or web address] Password [email or web address]  08/25/2016, 10:41 PM

## 2016-08-25 NOTE — ED Notes (Signed)
Pt sister wants to be called when pt is ready for p/u after d/c.  [phone]

## 2016-08-25 NOTE — ED Notes (Signed)
Bed: WA21 Expected date:  Expected time:  Means of arrival:  Comments: Seizures

## 2016-08-25 NOTE — ED Notes (Addendum)
PT WITNESSED STUMBLING OUT OF THE ROOM AT A FAST PACE AND FELL ONTO THE FLOOR IN THE HALL WAY ON HIS RIGHT SIDE. AAOX2. WHEN I ARRIVED TO THE AREA, THE PT WAS ATTEMPTING TO PICK HIMSELF OFF OF THE FLOOR, STATING, "I CAN GET UP ON MY OWN!' PT WAS UNABLE TO PICK HIMSELF UP, SO STAFF ASSISTED HIM BACK TO HIS ROOM. THE PT WAS VERY UNSTEADY UPON STANDING. STRETCHER FOUND WITH SIDE RAILS UPX2 WITH SEIZURE PADS ATTACHED, AS WELL AS CALL BELL ON THE STRETCHER. STAFF ASSISTED PT BACK TO BED. SIDE RAILS UP X2 WITH SEIZURE PADS ATTACHED, BED IN THE LOWEST POSITION, AND CALL BELL WITHIN REACH. PT ADVISED TO CALL IF HE NEEDS ASSISTANCE. WHEN PT WAS ASKED WHERE WAS HE GOING, PT STS, "I WAS READY TO GO HOME." PA EMILY MADE AWARE.

## 2016-08-25 NOTE — ED Notes (Signed)
[facility] [phone]. CALL HER IF UNABLE TO GET IN TOUCH WITH HER MOTHER OR AUNT.

## 2016-08-25 NOTE — ED Notes (Signed)
WILL TRANSPORT AND GIVE REPORT TO 2W 1224-1. AAOX2. PT IN NO APPARENT DISTRESS OR PAIN. FAMILY AT THE BEDSIDE. THE OPPORTUNITY TO ASK QUESTIONS WAS PROVIDED.

## 2016-08-25 NOTE — ED Triage Notes (Addendum)
PT RECEIVED VIA EMS FOR A WITNESSED SEIZURE X3 WITH A FALL. PER EMS, UPON PT WALKING INTO THE [facility], HE BEGAN TO HAVE A SEIZURE AND FELL ON THE GROUND. WHILE ON THE GROUND, BY STANDERS STS THAT HE HAD 2 MORE SEIZURES LASTING 30 SECONDS. WHEN EMS ARRIVED, PT WAS POSTICTAL. PT WAS GIVEN  ZOFRAN 4MG  IV X1 C-COLLAR IN PLACE. PT AAOX2. PT STS HE TAKES DILANTIN, BUT HAS BEEN OUT OF HIS MEDS X1 MONTH.

## 2016-08-26 DIAGNOSIS — K7010 Alcoholic hepatitis without ascites: Secondary | ICD-10-CM | POA: Diagnosis present

## 2016-08-26 DIAGNOSIS — Z8249 Family history of ischemic heart disease and other diseases of the circulatory system: Secondary | ICD-10-CM | POA: Diagnosis not present

## 2016-08-26 DIAGNOSIS — Z833 Family history of diabetes mellitus: Secondary | ICD-10-CM | POA: Diagnosis not present

## 2016-08-26 DIAGNOSIS — Z96642 Presence of left artificial hip joint: Secondary | ICD-10-CM | POA: Diagnosis present

## 2016-08-26 DIAGNOSIS — R509 Fever, unspecified: Secondary | ICD-10-CM | POA: Diagnosis present

## 2016-08-26 DIAGNOSIS — G40909 Epilepsy, unspecified, not intractable, without status epilepticus: Secondary | ICD-10-CM | POA: Diagnosis not present

## 2016-08-26 DIAGNOSIS — K219 Gastro-esophageal reflux disease without esophagitis: Secondary | ICD-10-CM | POA: Diagnosis present

## 2016-08-26 DIAGNOSIS — Z91030 Bee allergy status: Secondary | ICD-10-CM | POA: Diagnosis not present

## 2016-08-26 DIAGNOSIS — F10239 Alcohol dependence with withdrawal, unspecified: Secondary | ICD-10-CM | POA: Diagnosis present

## 2016-08-26 DIAGNOSIS — F10231 Alcohol dependence with withdrawal delirium: Secondary | ICD-10-CM | POA: Diagnosis not present

## 2016-08-26 DIAGNOSIS — F200 Paranoid schizophrenia: Secondary | ICD-10-CM | POA: Diagnosis present

## 2016-08-26 DIAGNOSIS — R740 Nonspecific elevation of levels of transaminase and lactic acid dehydrogenase [LDH]: Secondary | ICD-10-CM | POA: Diagnosis present

## 2016-08-26 DIAGNOSIS — Z8659 Personal history of other mental and behavioral disorders: Secondary | ICD-10-CM | POA: Diagnosis not present

## 2016-08-26 DIAGNOSIS — F10230 Alcohol dependence with withdrawal, uncomplicated: Secondary | ICD-10-CM | POA: Diagnosis not present

## 2016-08-26 DIAGNOSIS — F17210 Nicotine dependence, cigarettes, uncomplicated: Secondary | ICD-10-CM | POA: Diagnosis present

## 2016-08-26 DIAGNOSIS — R079 Chest pain, unspecified: Secondary | ICD-10-CM | POA: Diagnosis present

## 2016-08-26 DIAGNOSIS — R569 Unspecified convulsions: Secondary | ICD-10-CM | POA: Diagnosis not present

## 2016-08-26 DIAGNOSIS — Z781 Physical restraint status: Secondary | ICD-10-CM | POA: Diagnosis not present

## 2016-08-26 DIAGNOSIS — E162 Hypoglycemia, unspecified: Secondary | ICD-10-CM | POA: Diagnosis not present

## 2016-08-26 DIAGNOSIS — D6959 Other secondary thrombocytopenia: Secondary | ICD-10-CM | POA: Diagnosis present

## 2016-08-26 DIAGNOSIS — Z79899 Other long term (current) drug therapy: Secondary | ICD-10-CM | POA: Diagnosis not present

## 2016-08-26 DIAGNOSIS — R451 Restlessness and agitation: Secondary | ICD-10-CM | POA: Diagnosis present

## 2016-08-26 DIAGNOSIS — Z9114 Patient's other noncompliance with medication regimen: Secondary | ICD-10-CM | POA: Diagnosis not present

## 2016-08-26 DIAGNOSIS — E876 Hypokalemia: Secondary | ICD-10-CM | POA: Diagnosis present

## 2016-08-26 LAB — URINE MICROSCOPIC-ADD ON: RBC / HPF: NONE SEEN RBC/hpf (ref 0–5)

## 2016-08-26 LAB — MRSA PCR SCREENING: MRSA BY PCR: NEGATIVE

## 2016-08-26 LAB — URINALYSIS, ROUTINE W REFLEX MICROSCOPIC
BILIRUBIN URINE: NEGATIVE
GLUCOSE, UA: NEGATIVE mg/dL
HGB URINE DIPSTICK: NEGATIVE
KETONES UR: NEGATIVE mg/dL
Nitrite: NEGATIVE
PROTEIN: NEGATIVE mg/dL
Specific Gravity, Urine: 1.019 (ref 1.005–1.030)
pH: 7 (ref 5.0–8.0)

## 2016-08-26 LAB — RAPID URINE DRUG SCREEN, HOSP PERFORMED
Amphetamines: NOT DETECTED
BARBITURATES: NOT DETECTED
Benzodiazepines: POSITIVE — AB
COCAINE: NOT DETECTED
Opiates: NOT DETECTED
Tetrahydrocannabinol: NOT DETECTED

## 2016-08-26 LAB — PROTIME-INR
INR: 1.11
PROTHROMBIN TIME: 14.4 s (ref 11.4–15.2)

## 2016-08-26 LAB — APTT: APTT: 29 s (ref 24–36)

## 2016-08-26 MED ORDER — LORAZEPAM 2 MG/ML IJ SOLN: 0.0000 mg | Freq: Two times a day (BID) | INTRAMUSCULAR | Status: AC

## 2016-08-26 MED ORDER — LORAZEPAM 2 MG/ML IJ SOLN
INTRAMUSCULAR | Status: AC
  Administered 2016-08-27: 1 mg
  Filled 2016-08-26: qty 1

## 2016-08-26 MED ORDER — LORAZEPAM 2 MG/ML IJ SOLN
1.0000 mg | Freq: Four times a day (QID) | INTRAMUSCULAR | Status: AC | PRN
  Filled 2016-08-26: qty 1

## 2016-08-26 MED ORDER — LORAZEPAM 1 MG PO TABS: 1.0000 mg | ORAL_TABLET | Freq: Four times a day (QID) | ORAL | Status: AC | PRN

## 2016-08-26 MED ORDER — LORAZEPAM 2 MG/ML IJ SOLN
0.0000 mg | Freq: Four times a day (QID) | INTRAMUSCULAR | Status: AC
Start: 2016-08-27 — End: 2016-08-27
  Administered 2016-08-26: 2 mg via INTRAMUSCULAR
  Administered 2016-08-27: 4 mg via INTRAMUSCULAR
  Filled 2016-08-26: qty 2

## 2016-08-26 MED ORDER — SODIUM CHLORIDE 0.9 % IV SOLN
INTRAVENOUS | Status: DC
  Administered 2016-08-26 – 2016-08-28 (×3): via INTRAVENOUS

## 2016-08-26 NOTE — Progress Notes (Signed)
MEDICATION RELATED CONSULT NOTE - INITIAL   Pharmacy Consult for Phenytoin Indication: Seizures  Allergies  Allergen Reactions  . Bee Venom Anaphylaxis    Patient Measurements: Height: 5\' 5"  (165.1 cm) Weight: 151 lb 3.8 oz (68.6 kg) IBW/kg (Calculated) : 61.5 Adjusted Body Weight:   Vital Signs: Temp: 98.9 F (37.2 C) (09/02 0400) Temp Source: Oral (09/02 0400) BP: 138/96 (09/02 0500) Pulse Rate: 81 (09/02 0500) Intake/Output from previous day: 09/01 0701 - 09/02 0700 In: 125 [I.V.:125] Out: 300 [Urine:300] Intake/Output from this shift: Total I/O In: 125 [I.V.:125] Out: 300 [Urine:300]  Labs:  Recent Labs  08/25/16 1338 08/25/16 1730 08/26/16 0002  WBC 5.7  --   --   HGB 11.9*  --   --   HCT 35.4*  --   --   PLT 160  --   --   APTT  --   --  29  CREATININE 0.96 0.77  --   ALBUMIN 4.1  --   --   PROT 8.4*  --   --   AST 205*  --   --   ALT 64*  --   --   ALKPHOS 66  --   --   BILITOT 0.4  --   --   BILIDIR 0.3  --   --   IBILI 0.1*  --   --    Estimated Creatinine Clearance: 107.8 mL/min (by C-G formula based on SCr of 0.8 mg/dL).   Microbiology: Recent Results (from the past 720 hour(s))  MRSA PCR Screening     Status: None   Collection Time: 08/26/16 12:03 AM  Result Value Ref Range Status   MRSA by PCR NEGATIVE NEGATIVE Final    Comment:        The GeneXpert MRSA Assay (FDA approved for NASAL specimens only), is one component of a comprehensive MRSA colonization surveillance program. It is not intended to diagnose MRSA infection nor to guide or monitor treatment for MRSA infections.     Medical History: Past Medical History:  Diagnosis Date  . Arthritis    hands  . Avascular necrosis (HCC)    L HIP  . Avascular necrosis of hip (HCC)    left  . Schizophrenia (HCC)   . Seizures (HCC)    HX [place] seiaures - last seizure 6-7 months ago  . Substance abuse    has not used for 5-6 yrs    Medications:  Prescriptions Prior to  Admission  Medication Sig Dispense Refill Last Dose  . benztropine (COGENTIN) 1 MG tablet Take 1 tablet (1 mg total) by mouth 2 (two) times daily. (Patient not taking: Reported on 12/04/2015) 60 tablet 0 Not Taking at Unknown time  . cyclobenzaprine (FLEXERIL) 10 MG tablet Take 1 tablet (10 mg total) by mouth 3 (three) times daily as needed for muscle spasms (or pain). (Patient not taking: Reported on 08/25/2016) 15 tablet 0 Not Taking at Unknown time  . Docusate Sodium (DSS) 100 MG CAPS Take 100 mg by mouth 2 (two) times daily. (Patient not taking: Reported on 12/04/2015)   Not Taking at Unknown time  . EPINEPHrine 0.3 mg/0.3 mL IJ SOAJ injection Inject 0.3 mLs (0.3 mg total) into the muscle as needed. (Patient not taking: Reported on 12/04/2015) 2 Device 0 Not Taking at Unknown time  . haloperidol (HALDOL) 10 MG tablet Take one tablet (10 mg) at bedtime. (Patient not taking: Reported on 12/04/2015) 30 tablet 0 Not Taking at Unknown time  .  haloperidol (HALDOL) 5 MG tablet Take 1 tablet (5 mg total) by mouth daily with breakfast. (Patient not taking: Reported on 12/04/2015) 30 tablet 0 Not Taking at Unknown time  . haloperidol decanoate (HALDOL DECANOATE) 100 MG/ML injection Inject 0.5 mLs (50 mg total) into the muscle every 21 ( twenty-one) days. (Patient not taking: Reported on 12/04/2015) 1 mL 0 Not Taking at Unknown time  . ibuprofen (ADVIL,MOTRIN) 800 MG tablet Take 1 tablet (800 mg total) by mouth every 8 (eight) hours as needed for mild pain or moderate pain. (Patient not taking: Reported on 08/25/2016) 15 tablet 0 Not Taking at Unknown time  . traZODone (DESYREL) 50 MG tablet Take 1 tablet (50 mg total) by mouth at bedtime. (Patient not taking: Reported on 12/04/2015) 30 tablet 0 Not Taking at Unknown time   Scheduled:  . folic acid  1 mg Oral Daily  . levETIRAcetam  500 mg Oral BID  . LORazepam  0-4 mg Intravenous Q6H   Followed by  . [START ON 08/27/2016] LORazepam  0-4 mg Intravenous Q12H  .  multivitamin with minerals  1 tablet Oral Daily  . phenytoin  100 mg Oral Daily  . phenytoin  200 mg Oral QHS  . thiamine  100 mg Oral Daily   Or  . thiamine  100 mg Intravenous Daily    Assessment: Patient with hx of seizures, hx of medical noncompliance and undetectable phenytoin level in ED.  Patient loaded with 1500mg  iv phenytoin in ED, charted 1533 9/1.  Last recorded home phenytoin regimen resumed.  Goal of Therapy:  Phenytoin level 10-20  Plan:  Continue with current dosing regimen. Check level after 5-7 days of resumption of maintenance regimen to assess regimen.  Darlina GuysGrimsley Jr, Jacquenette ShoneJulian Crowford 08/26/2016,5:54 AM

## 2016-08-26 NOTE — Progress Notes (Signed)
[facility] Cross Coverage Note  Paged at [phone] re: patient threatening to leave, family requesting IVC again.  On arrival to room, patient in bed, agitated, IV team present to place new IV which had been lost.  Patient with no medications administered since noon.  CIWA initially had been 5-8, then 1-2 during the afternoon, now 8 again. BP 132/87 (BP Location: Left Arm)   Pulse 78   Temp 98.5 F (36.9 C) (Oral)   Resp 16   Ht 5\' 5"  (1.651 m)   Wt 68.6 kg (151 lb 3.8 oz)   SpO2 100%   BMI 25.17 kg/m  Patient oriented to "hospital", "[place]", "here because I had 3 seizures back to back", doesn't know year, states incorrectly that he lives with his sister.  HR normal.  No obvious hallucinations.  No paranoid behavior.  Unable to stand steadily or walk without assistance.    Assessment and plan: Patient given 2mg  Ativan for CIWA protocol, and much more calm.  Lights dimmed, watching television. States "I am leaving tomorrow."  Continue redirection, lorazepam per CIWA. Defer IVC at present, although patient does not have capacity to make safe decisions at present given medication with lorazepam and and what appears to be disorganized thinking (perhaps withdrawal). Consult to Psychiatry tomorrow Continue fall precautions, continue seizure precautions

## 2016-08-26 NOTE — Progress Notes (Signed)
Patient ID: [name], male   DOB: 07-Mar-1976, 40 y.o.   MRN: [id number]   PROGRESS NOTE    [name]  ION:[id number] DOB: 07-Mar-1976 DOA: 08/25/2016  PCP: Nilda SimmerSMITH,KRISTI, MD   Brief Narrative:  40 y.o. gentleman with a history of alcohol dependence, seizure disorder, schizophrenia, and medical noncompliance who was [facility] ED [facility]    Assessment & Plan:   Principal Problem:   Seizure disorder (HCC) - in the setting of medical non compliance - confirmed by sub therapeutic Dilantin level - sister at bedside concerned and says pt has known metal challenge and is not capable of taking medications correctly and is prone to mis use of medications - CW consult - EEG pending   Active Problems:   S/P left THA, AA   Alcohol use disorder, severe, dependence (HCC) - discussed with pt but he appears not to be adequately engaged in conversation - will continue our efforts     Hx of schizophrenia - stable for now    Fever - no clear etiology - no cardiopulmonary concerns, no specific urinary concerns - monitor fever curve - continue evaluation if spikes another fever   DVT prophylaxis: SCD's Code Status: Full  Family Communication: Patient at bedside, sisters in the room  Disposition Plan: to be determined   Consultants:   None  Procedures:   None  Antimicrobials:   None    Subjective: No events overnight.   Objective: Vitals:   08/26/16 1600 08/26/16 1619 08/26/16 1622 08/26/16 1729  BP:  (!) 139/99  132/87  Pulse: 89  97 78  Resp: (!) 21  15 18   Temp:    99.2 F (37.3 C)  TempSrc:    Oral  SpO2: 99%  100% 100%  Weight:      Height:        Intake/Output Summary (Last 24 hours) at 08/26/16 1734 Last data filed at 08/26/16 1644  Gross per 24 hour  Intake             1425 ml  Output             1200 ml  Net              225 ml   Filed Weights   08/25/16 1311 08/26/16 0000  Weight: 68 kg (150  lb) 68.6 kg (151 lb 3.8 oz)    Examination:  General exam: Appears calm and comfortable  Respiratory system: Clear to auscultation. Respiratory effort normal. Cardiovascular system: S1 & S2 heard, RRR. No JVD, murmurs, rubs, gallops or clicks. No pedal edema. Gastrointestinal system: Abdomen is nondistended, soft and nontender. No organomegaly or masses felt.  Central nervous system: Alert and oriented. No focal neurological deficits.  Data Reviewed: I have personally reviewed following labs and imaging studies  CBC:  Recent Labs Lab 08/25/16 1338  WBC 5.7  HGB 11.9*  HCT 35.4*  MCV 94.7  PLT 160   Basic Metabolic Panel:  Recent Labs Lab 08/25/16 1338 08/25/16 1730  NA 140 138  K 4.7 3.5  CL 103 104  CO2 17* 23  GLUCOSE 131* 102*  BUN 7 <5*  CREATININE 0.96 0.77  CALCIUM 9.3 8.5*   GFR: Estimated Creatinine Clearance: 107.8 mL/min (by C-G formula based on SCr of 0.8 mg/dL). Liver Function Tests:  Recent Labs Lab 08/25/16 1338  AST 205*  ALT 64*  ALKPHOS 66  BILITOT 0.4  PROT 8.4*  ALBUMIN  4.1   No results for input(s): LIPASE, AMYLASE in the last 168 hours. No results for input(s): AMMONIA in the last 168 hours. Coagulation Profile:  Recent Labs Lab 08/26/16 0002  INR 1.11   Urine analysis:    Component Value Date/Time   COLORURINE AMBER (A) 08/25/2016 0547   APPEARANCEUR CLEAR 08/25/2016 0547   LABSPEC 1.019 08/25/2016 0547   PHURINE 7.0 08/25/2016 0547   GLUCOSEU NEGATIVE 08/25/2016 0547   HGBUR NEGATIVE 08/25/2016 0547   BILIRUBINUR NEGATIVE 08/25/2016 0547   KETONESUR NEGATIVE 08/25/2016 0547   PROTEINUR NEGATIVE 08/25/2016 0547   UROBILINOGEN 1.0 08/06/2015 1535   NITRITE NEGATIVE 08/25/2016 0547   LEUKOCYTESUR SMALL (A) 08/25/2016 0547    Recent Results (from the past 240 hour(s))  MRSA PCR Screening     Status: None   Collection Time: 08/26/16 12:03 AM  Result Value Ref Range Status   MRSA by PCR NEGATIVE NEGATIVE Final     Comment:        The GeneXpert MRSA Assay (FDA approved for NASAL specimens only), is one component of a comprehensive MRSA colonization surveillance program. It is not intended to diagnose MRSA infection nor to guide or monitor treatment for MRSA infections.       Radiology Studies: Ct Head Wo Contrast  Result Date: 08/25/2016 CLINICAL DATA:  Seizure.  Fall. EXAM: CT HEAD WITHOUT CONTRAST TECHNIQUE: Contiguous axial images were obtained from the base of the skull through the vertex without intravenous contrast. COMPARISON:  05/23/2016 FINDINGS: CT HEAD FINDINGS Brain: Cerebral and cerebellar atrophy for age. No mass lesion, hemorrhage, hydrocephalus, acute infarct, intra-axial, or extra-axial fluid collection. Vascular: No hyperdense vessel or unexpected calcification. Skull: No significant soft tissue swelling.  No skull fracture. Sinuses/Orbits: Normal orbits and globes. Clear paranasal sinuses and mastoid air cells. Other: None CT CERVICAL SPINE FINDINGS Spinal visualization through the bottom of T1. Prevertebral soft tissues are within normal limits. No apical pneumothorax. Skull base intact. Maintenance of vertebral body height. Straightening of expected cervical lordosis. Degenerative disc disease and spondylosis at C4-5 and 5-6. Facets are well-aligned. Coronal reformats demonstrate a normal C1-C2 articulation. IMPRESSION: 1. No acute intracranial abnormality. Age advanced cerebral and cerebellar atrophy. 2. No acute fracture or subluxation in the cervical spine. Straightening of expected cervical lordosis could be positional, due to muscular spasm, or ligamentous injury. Electronically Signed   By: Jeronimo Greaves M.D.   On: 08/25/2016 16:49   Ct Cervical Spine Wo Contrast  Result Date: 08/25/2016 CLINICAL DATA:  Seizure.  Fall. EXAM: CT HEAD WITHOUT CONTRAST TECHNIQUE: Contiguous axial images were obtained from the base of the skull through the vertex without intravenous contrast.  COMPARISON:  05/23/2016 FINDINGS: CT HEAD FINDINGS Brain: Cerebral and cerebellar atrophy for age. No mass lesion, hemorrhage, hydrocephalus, acute infarct, intra-axial, or extra-axial fluid collection. Vascular: No hyperdense vessel or unexpected calcification. Skull: No significant soft tissue swelling.  No skull fracture. Sinuses/Orbits: Normal orbits and globes. Clear paranasal sinuses and mastoid air cells. Other: None CT CERVICAL SPINE FINDINGS Spinal visualization through the bottom of T1. Prevertebral soft tissues are within normal limits. No apical pneumothorax. Skull base intact. Maintenance of vertebral body height. Straightening of expected cervical lordosis. Degenerative disc disease and spondylosis at C4-5 and 5-6. Facets are well-aligned. Coronal reformats demonstrate a normal C1-C2 articulation. IMPRESSION: 1. No acute intracranial abnormality. Age advanced cerebral and cerebellar atrophy. 2. No acute fracture or subluxation in the cervical spine. Straightening of expected cervical lordosis could be positional, due to muscular spasm,  or ligamentous injury. Electronically Signed   By: Jeronimo Greaves M.D.   On: 08/25/2016 16:49      Scheduled Meds: . folic acid  1 mg Oral Daily  . levETIRAcetam  500 mg Oral BID  . LORazepam  0-4 mg Intravenous Q6H   Followed by  . [START ON 08/27/2016] LORazepam  0-4 mg Intravenous Q12H  . multivitamin with minerals  1 tablet Oral Daily  . phenytoin  100 mg Oral Daily  . phenytoin  200 mg Oral QHS  . thiamine  100 mg Oral Daily   Or  . thiamine  100 mg Intravenous Daily   Continuous Infusions: . sodium chloride 100 mL/hr at 08/26/16 1634     LOS: 0 days    Time spent: 20 minutes    Debbora Presto, MD [facility] Pager [phone]  If 7PM-7AM, please contact night-coverage [email or web address] Password [facility] 08/26/2016, 5:34 PM

## 2016-08-26 NOTE — Progress Notes (Signed)
Pt's HCPOA, sister, spoke with this RN about having pt IVC. Daphane ShepherdM. Lynch, provider on call, is remote tonight, so Admitting MD paged regarding coming to assess mental competency. Mardene CelesteAsaro, Chiante Peden I

## 2016-08-26 NOTE — Progress Notes (Signed)
Pt family has brought [place]. Original copy given to secretary to make copy for chart. Original copy returned to family. Copy is in the chart.

## 2016-08-26 NOTE — Progress Notes (Signed)
   08/26/16 2155  What Happened  Was fall witnessed? Yes  Who witnessed fall? Derek Jackina Mitchel, NT  Patients activity before fall ambulating-unassisted;other (comment)  Point of contact arm/shoulder;hip/leg  Was patient injured? Unsure  Follow Up  MD notified M. Burnadette PeterLynch, NP  Time MD notified 2155  Family notified Yes-comment (sister, Elinor DodgeGwendolyn notified)  Time family notified 2205  Additional tests No  Simple treatment Other (comment) (pt refused)  Adult Fall Risk Assessment  Risk Factor Category (scoring not indicated) Fall has occurred during this admission (document High fall risk)  Patient's Fall Risk High Fall Risk (>13 points)  Adult Fall Risk Interventions  Required Bundle Interventions *See Row Information* High fall risk - low, moderate, and high requirements implemented  Additional Interventions Use of appropriate toileting equipment (bedpan, BSC, etc.);Safety Sitter/Safety Rounder (sitter ordered but none available)  Screening for Fall Injury Risk  Risk For Fall Injury- See Row Information  D;Nurse judgement  Injury Prevention Interventions Safety Sitter/Safety Rounder (sitter now ordered but none available)  Vitals  Temp 98.5 F (36.9 C)  Temp Source Oral  BP (!) 155/91  MAP (mmHg) 103  BP Location Left Arm  BP Method Automatic  Patient Position (if appropriate) Lying  Pulse Rate 68  Pulse Rate Source Dinamap  Resp 16  Oxygen Therapy  SpO2 100 %  O2 Device Room Air  Pain Assessment  Pain Assessment No/denies pain  Neurological  Neuro (WDL) X  Orientation Level Disoriented to time;Disoriented to situation  Musculoskeletal  Musculoskeletal (WDL) X  Generalized Weakness Yes  Integumentary  Integumentary (WDL) X  Skin Integrity Skin tear  Skin Tear Location Elbow  Skin Tear Location Orientation Right

## 2016-08-26 NOTE — Progress Notes (Signed)
Pt repeatedly getting out of bed without assistance despite education to stay in bed for safety reasons since gait is unsteady. Pt stating, "I need to go home." IV pulled out by pt. IV team paged stat for new IV so Ativan can be given. Pt is in room closest to nurse's station, bed alarm is set. Will continue to monitor pt closely. Mardene CelesteAsaro, Anissia Wessells I

## 2016-08-27 DIAGNOSIS — F10239 Alcohol dependence with withdrawal, unspecified: Secondary | ICD-10-CM | POA: Diagnosis not present

## 2016-08-27 DIAGNOSIS — Z8659 Personal history of other mental and behavioral disorders: Secondary | ICD-10-CM | POA: Diagnosis not present

## 2016-08-27 LAB — CBC
HCT: 34.6 % — ABNORMAL LOW (ref 39.0–52.0)
Hemoglobin: 11.8 g/dL — ABNORMAL LOW (ref 13.0–17.0)
MCH: 31.9 pg (ref 26.0–34.0)
MCHC: 34.1 g/dL (ref 30.0–36.0)
MCV: 93.5 fL (ref 78.0–100.0)
Platelets: 119 10*3/uL — ABNORMAL LOW (ref 150–400)
RBC: 3.7 MIL/uL — ABNORMAL LOW (ref 4.22–5.81)
RDW: 14.5 % (ref 11.5–15.5)
WBC: 5.4 10*3/uL (ref 4.0–10.5)

## 2016-08-27 LAB — BASIC METABOLIC PANEL
Anion gap: 9 (ref 5–15)
CALCIUM: 9.2 mg/dL (ref 8.9–10.3)
CHLORIDE: 102 mmol/L (ref 101–111)
CO2: 24 mmol/L (ref 22–32)
CREATININE: 0.65 mg/dL (ref 0.61–1.24)
GFR calc non Af Amer: >60 mL/min (ref >60)
Glucose, Bld: 74 mg/dL (ref 65–99)
Potassium: 3 mmol/L — ABNORMAL LOW (ref 3.5–5.1)
SODIUM: 135 mmol/L (ref 135–145)

## 2016-08-27 MED ORDER — PHENYTOIN SODIUM 50 MG/ML IJ SOLN
100.0000 mg | Freq: Three times a day (TID) | INTRAMUSCULAR | Status: DC
  Administered 2016-08-27 – 2016-08-29 (×7): 100 mg via INTRAVENOUS
  Filled 2016-08-27 (×7): qty 2

## 2016-08-27 MED ORDER — SODIUM CHLORIDE 0.9 % IV SOLN
500.0000 mg | Freq: Two times a day (BID) | INTRAVENOUS | Status: DC
  Administered 2016-08-27 – 2016-08-29 (×5): 500 mg via INTRAVENOUS
  Filled 2016-08-27 (×6): qty 5

## 2016-08-27 MED ORDER — PHENYTOIN SODIUM 50 MG/ML IJ SOLN
100.0000 mg | Freq: Once | INTRAMUSCULAR | Status: AC
  Administered 2016-08-27: 100 mg via INTRAVENOUS
  Filled 2016-08-27: qty 2

## 2016-08-27 MED ORDER — POTASSIUM CHLORIDE 10 MEQ/100ML IV SOLN
10.0000 meq | INTRAVENOUS | Status: AC
  Administered 2016-08-27 (×4): 10 meq via INTRAVENOUS
  Filled 2016-08-27 (×2): qty 100

## 2016-08-27 MED ORDER — QUETIAPINE FUMARATE 25 MG PO TABS
50.0000 mg | ORAL_TABLET | Freq: Once | ORAL | Status: AC
  Administered 2016-08-27: 50 mg via ORAL
  Filled 2016-08-27: qty 2

## 2016-08-27 MED ORDER — DEXMEDETOMIDINE HCL IN NACL 200 MCG/50ML IV SOLN
0.4000 ug/kg/h | INTRAVENOUS | Status: DC
  Administered 2016-08-27: 0.4 ug/kg/h via INTRAVENOUS
  Administered 2016-08-27: 0.6 ug/kg/h via INTRAVENOUS
  Administered 2016-08-27: 0.4 ug/kg/h via INTRAVENOUS
  Administered 2016-08-28: 0.5 ug/kg/h via INTRAVENOUS
  Administered 2016-08-28 (×2): 0.6 ug/kg/h via INTRAVENOUS
  Administered 2016-08-28: 0.3 ug/kg/h via INTRAVENOUS
  Filled 2016-08-27 (×7): qty 50

## 2016-08-27 NOTE — Consult Note (Signed)
[facility] Consult   Reason for Consult:  Alcohol withdrawal Referring Physician:  Dr. Doyle Askew Patient Identification: [name] MRN:  [id number] Principal Diagnosis: Alcohol withdrawal [facility]) Diagnosis:   Patient Active Problem List   Diagnosis Date Noted  . Alcohol withdrawal ([place]) [F10.239] 08/25/2016  . H/O noncompliance with medical treatment, presenting hazards to health [Z91.19] 08/25/2016  . Seizure ([place]) [R56.9] 08/25/2016  . Paranoid schizophrenia ([place]) [F20.0] 08/11/2015  . Seizure disorder ([place]) [X91.478] 08/10/2015  . Alcohol use disorder, severe, dependence ([place]) [F10.20] 08/10/2015  . Hx of schizophrenia [Z86.59]   . Expected blood loss anemia [D50.0] 04/30/2013  . Overweight (BMI 25.0-29.9) [E66.3] 04/30/2013  . S/P left THA, AA [Z96.60] 04/29/2013    Total Time spent with patient: 30 minutes  Subjective:   [name] is a 40 y.o. male patient admitted with AMS followed by seizure.  HPI:  [name] is a 40 y.o. Male, seen, chart reviewed for the face-to-face psychiatry consultation and evaluation. Case discussed briefly with the staff RN who is at bedside. Patient was not able to provide history during this evaluation because of excessive sedation. Staff RN reported patient has been placed on precedex. Psychiatric consultation follow-up with him when he is able to contribute for the history when his been off of the current precedex treatment.  So rest of the the information obtained from the available medical records: Patient with a history of alcohol dependence, seizure disorder, schizophrenia, and medical noncompliance who was brought to the ED after having several witnessed seizures in the [facility] store.  He is currently disoriented, but he is accompanied by his sister [name] who shares medical POA for the patient with another sibling.  She reports that the patient used to live with her, but he is now living independently in an apartment with  a room mate.  She reports that he told her that he had not been feeling well for the past several days.  She is highly suspicious that he has been noncompliant with his home medications.  He has reported nausea and vomiting for the past two days.  No fevers, chills, or sweats.  No diarrhea.  No dysuria.  He has intermittent burning chest pain that he associates with acid reflux.  No shortness of breath or syncope.  His sister thinks that his last drink was sometime yesterday.  He admits to a history of EtOH withdrawal.  ED Course: Dilantin level undetectable.  CT of head and neck do not show acute findings.  He received dilantin load 1582m IV x one and IV ativan x 2.  He has received 2L of NS.  CIWA protocol initiated.  Hospitalist asked to admit.  Past Psychiatric History: Alcohol dependence, seizure disorder, schizophrenia, and medical noncompliance   Risk to Self: Is patient at risk for suicide?: No Risk to Others:   Prior Inpatient Therapy:   Prior Outpatient Therapy:    Past Medical History:  Past Medical History:  Diagnosis Date  . Arthritis    hands  . Avascular necrosis ([place]    L HIP  . [place]ascular necrosis of [place] ([place]    left  . [place]ia [place]   . Seizures ([place]    HX [place] seiaures - last seizure 6-7 months ago  . Substance abuse    has not used for 5-6 yrs    Past Surgical History:  Procedure Laterality Date  . NO PAST SURGERIES    . 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 Cancer Brother   . Diabetes Mother   . Hypertension Mother   . Cancer Father   . Hypertension Father   . Hypertension Sister   . Diabetes Maternal Grandmother    Family Psychiatric  History: Unable to obtain secondary to his altered mental status Social History:  History   Alcohol Use  . 3.6 oz/week  . 6 Cans of beer per week    Comment: Pt. states that he drinks beer every couple of days 2 (12 oz cans)     History  Drug Use No    Social History   Social History  . Marital status: Single    Spouse name: N/A  . Number of children: N/A  . Years of education: N/A   Social History Main Topics  . Smoking status: Current Every Day Smoker    Packs/day: 1.50    Years: 5.00    Types: Cigarettes  . Smokeless tobacco: Never Used  . Alcohol use 3.6 oz/week    6 Cans of beer per week     Comment: Pt. states that he drinks beer every couple of days 2 (12 oz cans)  . Drug use: No  . Sexual activity: Yes    Birth control/ protection: Condom   Other Topics Concern  . None   Social History Narrative  . None   Additional Social History:    Allergies:   Allergies  Allergen Reactions  . Bee Venom Anaphylaxis    Labs:  Results for orders placed or performed during the hospital encounter of 08/25/16 (from the past 48 hour(s))  Basic metabolic panel - if new onset seizures     Status: Abnormal   Collection Time: 08/25/16  1:38 PM  Result Value Ref Range   Sodium 140 135 - 145 mmol/L   Potassium 4.7 3.5 - 5.1 mmol/L   Chloride 103 101 - 111 mmol/L   CO2 17 (L) 22 - 32 mmol/L   Glucose, Bld 131 (H) 65 - 99 mg/dL   BUN 7 6 - 20 mg/dL   Creatinine, Ser 0.96 0.61 - 1.24 mg/dL   Calcium 9.3 8.9 - 10.3 mg/dL   GFR calc non Af Amer >60 >60 mL/min   GFR calc Af Amer >60 >60 mL/min    Comment: (NOTE) The eGFR has been calculated using the CKD EPI equation. This calculation has not been validated in all clinical situations. eGFR's persistently <60 mL/min signify possible Chronic Kidney Disease.    Anion gap 20 (H) 5 - 15  Dilantin (phenytoin) Level (if patient is taking this medication)     Status: Abnormal   Collection Time: 08/25/16  1:38 PM  Result Value Ref Range   Phenytoin Lvl <2.5 (L) 10.0 - 20.0 ug/mL  CBC - if new onset seizures      Status: Abnormal   Collection Time: 08/25/16  1:38 PM  Result Value Ref Range   WBC 5.7 4.0 - 10.5 K/uL   RBC 3.74 (L) 4.22 - 5.81 MIL/uL   Hemoglobin 11.9 (L) 13.0 - 17.0 g/dL   HCT 35.4 (L) 39.0 - 52.0 %   MCV 94.7 78.0 - 100.0 fL   MCH 31.8 26.0 - 34.0 pg   MCHC 33.6 30.0 - 36.0 g/dL   RDW 15.1 11.5 - 15.5 %  Platelets 160 150 - 400 K/uL  Hepatic function panel     Status: Abnormal   Collection Time: 08/25/16  1:38 PM  Result Value Ref Range   Total Protein 8.4 (H) 6.5 - 8.1 g/dL   Albumin 4.1 3.5 - 5.0 g/dL   AST 205 (H) 15 - 41 U/L   ALT 64 (H) 17 - 63 U/L   Alkaline Phosphatase 66 38 - 126 U/L   Total Bilirubin 0.4 0.3 - 1.2 mg/dL   Bilirubin, Direct 0.3 0.1 - 0.5 mg/dL   Indirect Bilirubin 0.1 (L) 0.3 - 0.9 mg/dL  Basic metabolic panel     Status: Abnormal   Collection Time: 08/25/16  5:30 PM  Result Value Ref Range   Sodium 138 135 - 145 mmol/L   Potassium 3.5 3.5 - 5.1 mmol/L    Comment: DELTA CHECK NOTED REPEATED TO VERIFY    Chloride 104 101 - 111 mmol/L   CO2 23 22 - 32 mmol/L   Glucose, Bld 102 (H) 65 - 99 mg/dL   BUN <5 (L) 6 - 20 mg/dL   Creatinine, Ser 0.77 0.61 - 1.24 mg/dL   Calcium 8.5 (L) 8.9 - 10.3 mg/dL   GFR calc non Af Amer >60 >60 mL/min   GFR calc Af Amer >60 >60 mL/min    Comment: (NOTE) The eGFR has been calculated using the CKD EPI equation. This calculation has not been validated in all clinical situations. eGFR's persistently <60 mL/min signify possible Chronic Kidney Disease.    Anion gap 11 5 - 15  Protime-INR     Status: None   Collection Time: 08/26/16 12:02 AM  Result Value Ref Range   Prothrombin Time 14.4 11.4 - 15.2 seconds   INR 1.11   APTT     Status: None   Collection Time: 08/26/16 12:02 AM  Result Value Ref Range   aPTT 29 24 - 36 seconds  MRSA PCR Screening     Status: None   Collection Time: 08/26/16 12:03 AM  Result Value Ref Range   MRSA by PCR NEGATIVE NEGATIVE    Comment:        The GeneXpert MRSA Assay  (FDA approved for NASAL specimens only), is one component of a comprehensive MRSA colonization surveillance program. It is not intended to diagnose MRSA infection nor to guide or monitor treatment for MRSA infections.   CBC     Status: Abnormal   Collection Time: 08/27/16  8:51 AM  Result Value Ref Range   WBC 5.4 4.0 - 10.5 K/uL   RBC 3.70 (L) 4.22 - 5.81 MIL/uL   Hemoglobin 11.8 (L) 13.0 - 17.0 g/dL   HCT 34.6 (L) 39.0 - 52.0 %   MCV 93.5 78.0 - 100.0 fL   MCH 31.9 26.0 - 34.0 pg   MCHC 34.1 30.0 - 36.0 g/dL   RDW 14.5 11.5 - 15.5 %   Platelets 119 (L) 150 - 400 K/uL    Comment: RESULT REPEATED AND VERIFIED SPECIMEN CHECKED FOR CLOTS PLATELET COUNT CONFIRMED BY SMEAR   Basic metabolic panel     Status: Abnormal   Collection Time: 08/27/16  8:51 AM  Result Value Ref Range   Sodium 135 135 - 145 mmol/L   Potassium 3.0 (L) 3.5 - 5.1 mmol/L   Chloride 102 101 - 111 mmol/L   CO2 24 22 - 32 mmol/L   Glucose, Bld 74 65 - 99 mg/dL   BUN <5 (L) 6 - 20 mg/dL   Creatinine,  Ser 0.65 0.61 - 1.24 mg/dL   Calcium 9.2 8.9 - 10.3 mg/dL   GFR calc non Af Amer >60 >60 mL/min   GFR calc Af Amer >60 >60 mL/min    Comment: (NOTE) The eGFR has been calculated using the CKD EPI equation. This calculation has not been validated in all clinical situations. eGFR's persistently <60 mL/min signify possible Chronic Kidney Disease.    Anion gap 9 5 - 15    Current Facility-Administered Medications  Medication Dose Route Frequency Provider Last Rate Last Dose  . 0.9 %  sodium chloride infusion   Intravenous Continuous Theodis Blaze, MD 75 mL/hr at 08/27/16 0900    . acetaminophen (TYLENOL) tablet 650 mg  650 mg Oral Q4H PRN Lily Kocher, MD       Or  . acetaminophen (TYLENOL) suppository 650 mg  650 mg Rectal Q4H PRN Lily Kocher, MD      . dexmedetomidine (PRECEDEX) 200 MCG/50ML (4 mcg/mL) infusion  0.4-1.2 mcg/kg/hr Intravenous Titrated Theodis Blaze, MD 6.9 mL/hr at 08/27/16 1157 0.4  mcg/kg/hr at 08/27/16 1157  . diphenhydrAMINE (BENADRYL) capsule 25 mg  25 mg Oral Q6H PRN Lily Kocher, MD      . folic acid (FOLVITE) tablet 1 mg  1 mg Oral Daily Emily West, PA-C   1 mg at 08/26/16 1148  . levETIRAcetam (KEPPRA) 500 mg in sodium chloride 0.9 % 100 mL IVPB  500 mg Intravenous BID Theodis Blaze, MD   500 mg at 08/27/16 1227  . LORazepam (ATIVAN) injection 0-4 mg  0-4 mg Intramuscular Q12H Ritta Slot, NP      . LORazepam (ATIVAN) injection 1 mg  1 mg Intramuscular Q6H PRN Ritta Slot, NP       Or  . LORazepam (ATIVAN) tablet 1 mg  1 mg Oral Q6H PRN Ritta Slot, NP      . multivitamin with minerals tablet 1 tablet  1 tablet Oral Daily [place], PA-C   1 tablet at 08/26/16 1148  . ondansetron (ZOFRAN) tablet 4 mg  4 mg Oral Q6H PRN Lily Kocher, MD       Or  . ondansetron [facility]) injection 4 mg  4 mg Intravenous Q6H PRN Lily Kocher, MD      . phenytoin (DILANTIN) injection 100 mg  100 mg Intravenous Q8H Theodis Blaze, MD      . phenytoin (DILANTIN) injection 100 mg  100 mg Intravenous Once Theodis Blaze, MD      . potassium chloride 10 mEq in 100 mL IVPB  10 mEq Intravenous Q1 Hr x 4 Theodis Blaze, MD      . thiamine (VITAMIN B-1) tablet 100 mg  100 mg Oral Daily Emily West, PA-C   100 mg at 08/26/16 1147   Or  . thiamine (B-1) injection 100 mg  100 mg Intravenous Daily Emily West, PA-C   100 mg at 08/25/16 1457    Musculoskeletal: Strength & Muscle Tone: decreased Gait & Station: unable to stand Patient leans: N/A  Psychiatric Specialty Exam: Physical Exam as per history and physical   ROS unable to obtain secondary to altered mental status   Blood pressure 105/86, pulse 84, temperature 97.6 F (36.4 C), resp. rate 19, height _0  (1.651 m), weight 68.6 kg (151 lb 3.8 oz), SpO2 98 %.Body mass index is 25.17 kg/m.  General Appearance: Disheveled and Guarded  Eye Contact:  Minimal  Speech:  Slurred  Volume:  Decreased  Mood:  Depressed  Affect:  Restricted   Thought Process:  Irrelevant  Orientation:  Negative  Thought Content:  NA  Suicidal Thoughts:  Deferred  Homicidal Thoughts:  Deferred  Memory:  NA  Judgement:  NA  Insight:  NA  Psychomotor Activity:  Decreased  Concentration:  Concentration: NA and Attention Span: NA  Recall:  NA  Fund of Knowledge:  NA  Language:  Poor  Akathisia:  Negative  Handed:  Right  AIMS (if indicated):     Assets:  Others:  Deferred  ADL's:  Impaired  Cognition:  Impaired,  Severe  Sleep:        Treatment Plan Summary: Patient admitted to [facility] intensive care unit with altered mental status and history of alcohol abuse with the intoxication, withdrawal symptoms and status post seizure episode. Patient is currently placed on precedex treatment and been sedated so rest of the evaluation will be deferred for the letter date. Reviewed available labs which indicated he has multiple abnormal labs including elevated liver enzymes both AST and ALT. And patient phenytoin level is 2.5 mg/mL which is below therapeutic range.  Daily contact with patient to assess and evaluate symptoms and progress in treatment and Medication management  Patient may benefit from antipsychotic medication Haldol and benztropine when medically stable for psychosis Please contact the psychiatric consultation services when patient is able to communicate for the psychiatric evaluation Appreciate psychiatric consultation and follow up as clinically required Please contact [phone] or [phone] if needs further assistance  Disposition: Supportive therapy provided about ongoing stressors.  Ambrose Finland, MD 08/27/2016 12:58 PM

## 2016-08-27 NOTE — Progress Notes (Signed)
Pt continues to be very restless/agitated/abusive and sitter at bedside is unable to keep pt from moving down in bed and putting arms thru restraint and getting into a possible dangerous position.  Education uneffective and we placed pt bil wrists in restraints provided rationale and release criteria.  On call is now Montez MoritaCarter who has been notified and have requested bil wrist restraints and SR up x4.  .  All comfort  Measure provided and VSS remain stable.

## 2016-08-27 NOTE — Progress Notes (Addendum)
Have given pt a total of 3mg  Ativan IM since 2200, have a sitter at bedside, security in assisting and pt continues to be agitated, attempting oob, unable to follow instructions. Continues to attempt oob with unsteady gait.  All comfort measures provided.   No change from beginning of shift per Rea CollegeJessica RN.  I have taken over care as of 2300.  Have placed in him restraint belt and have educated pt on rationale and criteria for release.  Pt denies further questions.  VSS, pt has voided.  Daphane ShepherdM. Lynch on call and notified need for restraint order.  CN notified as well.   Sitter remains at bedside. Rea CollegeJessica Rn reported that she alerted on call that pt has continued to pull out IV's, is taking PO well and will not keep telemetry and that on call is aware that pt does not have IV, IVF or telemetry on.

## 2016-08-27 NOTE — Progress Notes (Addendum)
No need for ABX at this time.  Duane PrestoMAGICK-Elek Holderness, MD  [facility] Pager [phone]  If 7PM-7AM, please contact night-coverage [email or web address] Password [email or web address]

## 2016-08-27 NOTE — Progress Notes (Signed)
Patient has remained agitated and restless with escalating CIWA scores overnight despite appropriate treatment with IV/IM ativan overnight.  Will request transfer to the stepdown unit for PCCM evaluation and consideration for precedex therapy.  RN aware.

## 2016-08-27 NOTE — Progress Notes (Signed)
Patient has Peripheral IV access established, will hold off on PICC for now. Notified PICC RN. Attempted to notify Dr. Izola PriceMyers.

## 2016-08-27 NOTE — Progress Notes (Signed)
Patient ID: [name], male   DOB: [birth year], 40 y.o.   MRN: [id number]   PROGRESS NOTE    [name]  JYN:[id number] DOB: [birth year] DOA: 08/25/2016  PCP: Nilda SimmerSMITH,KRISTI, MD   Brief Narrative:  40 y.o. gentleman with a history of alcohol dependence, seizure disorder, schizophrenia, and medical noncompliance who was brought to the ED after [facility].    Major events since admission: 9/2 - became more agitated and restless on the floor, required transfer to SDU and placement on Precedex drip  Assessment & Plan:   Principal Problem:   Seizure disorder (HCC) - in the setting of medical non compliance - confirmed by sub therapeutic Dilantin level - EEG pending  - continue AED regimen   Active Problems:   S/P left THA, AA   Alcohol use disorder, severe, dependence (HCC) with alcohol withdrawal - did not respond to CIWA ativan and has required 4 point restraints - now on Precedex drip, continue to monitor in SDU    Hx of schizophrenia - will need psych eval one more alert and off precedex drip    Fever - no clear etiology - no cardiopulmonary concerns, no specific urinary concerns - no fevers since admission, WBC is WNL    Transaminitis - alcohol induced hepatitis - CMET in AM    Hypokalemia - supplement via IV and repeat BMP in AM    Thrombocytopenia - possibly from alcohol induced bone marrow damage - no signs of bleeding - CBC in AM  DVT prophylaxis: SCD's Code Status: Full  Family Communication: Patient at bedside,no family at bedside this AM Disposition Plan: to be determined   Consultants:   None  Procedures:   None  Antimicrobials:   None    Subjective: More restless, required transfer to SDU and precedex drip.   Objective: Vitals:   08/27/16 0830 08/27/16 0835 08/27/16 0900 08/27/16 0933  BP:  (!) 163/109  (!) 139/111  Pulse:  85 73 91  Resp: 18 15 19 19   Temp:      TempSrc:      SpO2:  100% 100%  100%  Weight:      Height:        Intake/Output Summary (Last 24 hours) at 08/27/16 1231 Last data filed at 08/27/16 0900  Gross per 24 hour  Intake             1315 ml  Output              900 ml  Net              415 ml   Filed Weights   08/25/16 1311 08/26/16 0000  Weight: 68 kg (150 lb) 68.6 kg (151 lb 3.8 oz)    Examination:  General exam: Appears calm on precedex drip Respiratory system: Clear to auscultation. Respiratory effort normal. Cardiovascular system: S1 & S2 heard, tachycardic. No JVD, murmurs, rubs, gallops or clicks. No pedal edema. Gastrointestinal system: Abdomen is nondistended, soft and nontender. No organomegaly or masses felt.  Central nervous system: moving all 4 extremities spontaneously   Data Reviewed: I have personally reviewed following labs and imaging studies  CBC:  Recent Labs Lab 08/25/16 1338 08/27/16 0851  WBC 5.7 5.4  HGB 11.9* 11.8*  HCT 35.4* 34.6*  MCV 94.7 93.5  PLT 160 119*   Basic Metabolic Panel:  Recent Labs Lab 08/25/16 1338 08/25/16 1730 08/27/16 0851  NA 140 138 135  K 4.7 3.5  3.0*  CL 103 104 102  CO2 17* 23 24  GLUCOSE 131* 102* 74  BUN 7 <5* <5*  CREATININE 0.96 0.77 0.65  CALCIUM 9.3 8.5* 9.2   Liver Function Tests:  Recent Labs Lab 08/25/16 1338  AST 205*  ALT 64*  ALKPHOS 66  BILITOT 0.4  PROT 8.4*  ALBUMIN 4.1   Coagulation Profile:  Recent Labs Lab 08/26/16 0002  INR 1.11   Urine analysis:    Component Value Date/Time   COLORURINE AMBER (A) 08/25/2016 0547   APPEARANCEUR CLEAR 08/25/2016 0547   LABSPEC 1.019 08/25/2016 0547   PHURINE 7.0 08/25/2016 0547   GLUCOSEU NEGATIVE 08/25/2016 0547   HGBUR NEGATIVE 08/25/2016 0547   BILIRUBINUR NEGATIVE 08/25/2016 0547   KETONESUR NEGATIVE 08/25/2016 0547   PROTEINUR NEGATIVE 08/25/2016 0547   UROBILINOGEN 1.0 08/06/2015 1535   NITRITE NEGATIVE 08/25/2016 0547   LEUKOCYTESUR SMALL (A) 08/25/2016 0547    Recent Results (from the past  240 hour(s))  MRSA PCR Screening     Status: None   Collection Time: 08/26/16 12:03 AM  Result Value Ref Range Status   MRSA by PCR NEGATIVE NEGATIVE Final    Comment:        The GeneXpert MRSA Assay (FDA approved for NASAL specimens only), is one component of a comprehensive MRSA colonization surveillance program. It is not intended to diagnose MRSA infection nor to guide or monitor treatment for MRSA infections.       Radiology Studies: Ct Head Wo Contrast  Result Date: 08/25/2016 CLINICAL DATA:  Seizure.  Fall. EXAM: CT HEAD WITHOUT CONTRAST TECHNIQUE: Contiguous axial images were obtained from the base of the skull through the vertex without intravenous contrast. COMPARISON:  05/23/2016 FINDINGS: CT HEAD FINDINGS Brain: Cerebral and cerebellar atrophy for age. No mass lesion, hemorrhage, hydrocephalus, acute infarct, intra-axial, or extra-axial fluid collection. Vascular: No hyperdense vessel or unexpected calcification. Skull: No significant soft tissue swelling.  No skull fracture. Sinuses/Orbits: Normal orbits and globes. Clear paranasal sinuses and mastoid air cells. Other: None CT CERVICAL SPINE FINDINGS Spinal visualization through the bottom of T1. Prevertebral soft tissues are within normal limits. No apical pneumothorax. Skull base intact. Maintenance of vertebral body height. Straightening of expected cervical lordosis. Degenerative disc disease and spondylosis at C4-5 and 5-6. Facets are well-aligned. Coronal reformats demonstrate a normal C1-C2 articulation. IMPRESSION: 1. No acute intracranial abnormality. Age advanced cerebral and cerebellar atrophy. 2. No acute fracture or subluxation in the cervical spine. Straightening of expected cervical lordosis could be positional, due to muscular spasm, or ligamentous injury. Electronically Signed   By: Jeronimo Greaves M.D.   On: 08/25/2016 16:49   Ct Cervical Spine Wo Contrast  Result Date: 08/25/2016 CLINICAL DATA:  Seizure.  Fall.  EXAM: CT HEAD WITHOUT CONTRAST TECHNIQUE: Contiguous axial images were obtained from the base of the skull through the vertex without intravenous contrast. COMPARISON:  05/23/2016 FINDINGS: CT HEAD FINDINGS Brain: Cerebral and cerebellar atrophy for age. No mass lesion, hemorrhage, hydrocephalus, acute infarct, intra-axial, or extra-axial fluid collection. Vascular: No hyperdense vessel or unexpected calcification. Skull: No significant soft tissue swelling.  No skull fracture. Sinuses/Orbits: Normal orbits and globes. Clear paranasal sinuses and mastoid air cells. Other: None CT CERVICAL SPINE FINDINGS Spinal visualization through the bottom of T1. Prevertebral soft tissues are within normal limits. No apical pneumothorax. Skull base intact. Maintenance of vertebral body height. Straightening of expected cervical lordosis. Degenerative disc disease and spondylosis at C4-5 and 5-6. Facets are well-aligned. Coronal reformats demonstrate  a normal C1-C2 articulation. IMPRESSION: 1. No acute intracranial abnormality. Age advanced cerebral and cerebellar atrophy. 2. No acute fracture or subluxation in the cervical spine. Straightening of expected cervical lordosis could be positional, due to muscular spasm, or ligamentous injury. Electronically Signed   By: Jeronimo Greaves M.D.   On: 08/25/2016 16:49      Scheduled Meds: . folic acid  1 mg Oral Daily  . levETIRAcetam  500 mg Intravenous BID  . LORazepam  0-4 mg Intramuscular Q12H  . multivitamin with minerals  1 tablet Oral Daily  . phenytoin (DILANTIN) IV  100 mg Intravenous Q8H  . phenytoin (DILANTIN) IV  100 mg Intravenous Once  . potassium chloride  10 mEq Intravenous Q1 Hr x 4  . thiamine  100 mg Oral Daily   Or  . thiamine  100 mg Intravenous Daily   Continuous Infusions: . sodium chloride 75 mL/hr at 08/27/16 0900  . dexmedetomidine 0.4 mcg/kg/hr (08/27/16 1157)     LOS: 1 day    Time spent: 20 minutes    Debbora Presto, MD [facility] Pager [phone]  If 7PM-7AM, please contact night-coverage [email or web address] Password [facility] 08/27/2016, 12:31 PM

## 2016-08-27 NOTE — Progress Notes (Signed)
Gave seroquel over an hour ago pt continues to struggle/agitated/restless in restraints with sitter at bedside.  BP 156/127, HR 140, sat 97, rr 32 taken at 0300.  Recheck 10 min later show bp 109/91, 91, rr 28.  Montez Moritaarter on call and notified.

## 2016-08-27 NOTE — Progress Notes (Signed)
Report called to RN Maebelle MunroeKayle for room 1222. Pt currently has order for soft wrist restraints but no order for soft waist belt restraint. MD paged to request order for waist restraint. Awaiting response and receiving RN aware. Patient currently sleeping- was very agitated at time of assessment but fell asleep very quickly after RN left room and is calm at this moment. Will transfer to room 1222 at this time.

## 2016-08-28 DIAGNOSIS — F10230 Alcohol dependence with withdrawal, uncomplicated: Secondary | ICD-10-CM

## 2016-08-28 LAB — COMPREHENSIVE METABOLIC PANEL
ALT: 37 U/L (ref 17–63)
AST: 68 U/L — AB (ref 15–41)
Albumin: 3.6 g/dL (ref 3.5–5.0)
Alkaline Phosphatase: 67 U/L (ref 38–126)
Anion gap: 11 (ref 5–15)
BILIRUBIN TOTAL: 1.1 mg/dL (ref 0.3–1.2)
BUN: 6 mg/dL (ref 6–20)
CO2: 21 mmol/L — ABNORMAL LOW (ref 22–32)
CREATININE: 0.58 mg/dL — AB (ref 0.61–1.24)
Calcium: 8.6 mg/dL — ABNORMAL LOW (ref 8.9–10.3)
Chloride: 106 mmol/L (ref 101–111)
Glucose, Bld: 60 mg/dL — ABNORMAL LOW (ref 65–99)
POTASSIUM: 3.8 mmol/L (ref 3.5–5.1)
Sodium: 138 mmol/L (ref 135–145)
TOTAL PROTEIN: 7.3 g/dL (ref 6.5–8.1)

## 2016-08-28 LAB — CBC
HCT: 37.3 % — ABNORMAL LOW (ref 39.0–52.0)
Hemoglobin: 12.6 g/dL — ABNORMAL LOW (ref 13.0–17.0)
MCH: 31.6 pg (ref 26.0–34.0)
MCHC: 33.8 g/dL (ref 30.0–36.0)
MCV: 93.5 fL (ref 78.0–100.0)
PLATELETS: 129 10*3/uL — AB (ref 150–400)
RBC: 3.99 MIL/uL — AB (ref 4.22–5.81)
RDW: 14.7 % (ref 11.5–15.5)
WBC: 4.4 10*3/uL (ref 4.0–10.5)

## 2016-08-28 MED ORDER — DEXTROSE 5 % IV SOLN
INTRAVENOUS | Status: DC
  Administered 2016-08-28: 14:00:00 via INTRAVENOUS

## 2016-08-28 NOTE — Progress Notes (Signed)
Patient ID: [name], male   DOB: 08-Sep-1976, 40 y.o.   MRN: [id number]   PROGRESS NOTE    [name]  WUJ:[id number] DOB: 08-Sep-1976 DOA: 08/25/2016  PCP: Nilda SimmerSMITH,KRISTI, MD   Brief Narrative:  40 y.o. gentleman with a history of alcohol dependence, seizure disorder, schizophrenia, and medical noncompliance who was brought to the ED [facility]    Major events since admission: 9/3 - became more agitated and restless on the floor, required transfer to SDU and placement on Precedex drip 9/4 - still on precedex drip, unable to taper off   Assessment & Plan:   Principal Problem:   Seizure disorder (HCC) - in the setting of medical non compliance - confirmed by sub therapeutic Dilantin level - EEG still pending  - continue AED regimen   Active Problems:   S/P left THA, AA   Alcohol use disorder, severe, dependence (HCC) with alcohol withdrawal - did not respond to CIWA ativan and has required 4 point restraints - now on Precedex drip, continue to monitor in SDU    Hx of schizophrenia - appreciate psych input, needs evaluation once more alert     Hypoglycemia  - place on D5 for now until pt more alert and able to take PO    Fever - no clear etiology - no cardiopulmonary concerns, no specific urinary concerns - no fevers since admission, WBC is WNL    Transaminitis - alcohol induced hepatitis - CMET in AM    Hypokalemia - supplemented and WNL this AM - BMP in AM    Thrombocytopenia - possibly from alcohol induced bone marrow damage - no signs of bleeding, Plt slightly better this am - CBC in AM  DVT prophylaxis: SCD's Code Status: Full  Family Communication: Patient at bedside,no family at bedside this AM Disposition Plan: to be determined   Consultants:   None  Procedures:   None  Antimicrobials:   None   Subjective: More restless, required transfer to SDU and precedex drip.   Objective: Vitals:   08/28/16 0800 08/28/16 1000 08/28/16 1117 08/28/16 1200  BP: (!) 140/103 122/89  (!) 144/100  Pulse: 75 88  76  Resp: 16 17  14   Temp: 97.3 F (36.3 C)  98.1 F (36.7 C)   TempSrc: Oral  Oral   SpO2: 100% 100%  100%  Weight:      Height:        Intake/Output Summary (Last 24 hours) at 08/28/16 1309 Last data filed at 08/28/16 1200  Gross per 24 hour  Intake             3041 ml  Output             1700 ml  Net             1341 ml   Filed Weights   08/25/16 1311 08/26/16 0000 08/27/16 2141  Weight: 68 kg (150 lb) 68.6 kg (151 lb 3.8 oz) 68.8 kg (151 lb 10.8 oz)    Examination:  General exam: Appears calm on precedex drip Respiratory system: Clear to auscultation. Respiratory effort normal. Cardiovascular system: S1 & S2 heard, tachycardic. No JVD, murmurs, rubs, gallops or clicks. No pedal edema. Gastrointestinal system: Abdomen is nondistended, soft and nontender. No organomegaly or masses felt.  Central nervous system: moving all 4 extremities spontaneously   Data Reviewed: I have personally reviewed following labs and imaging studies  CBC:  Recent Labs Lab 08/25/16 1338  08/27/16 0851 08/28/16 0322  WBC 5.7 5.4 4.4  HGB 11.9* 11.8* 12.6*  HCT 35.4* 34.6* 37.3*  MCV 94.7 93.5 93.5  PLT 160 119* 129*   Basic Metabolic Panel:  Recent Labs Lab 08/25/16 1338 08/25/16 1730 08/27/16 0851 08/28/16 0322  NA 140 138 135 138  K 4.7 3.5 3.0* 3.8  CL 103 104 102 106  CO2 17* 23 24 21*  GLUCOSE 131* 102* 74 60*  BUN 7 <5* <5* 6  CREATININE 0.96 0.77 0.65 0.58*  CALCIUM 9.3 8.5* 9.2 8.6*   Liver Function Tests:  Recent Labs Lab 08/25/16 1338 08/28/16 0322  AST 205* 68*  ALT 64* 37  ALKPHOS 66 67  BILITOT 0.4 1.1  PROT 8.4* 7.3  ALBUMIN 4.1 3.6   Coagulation Profile:  Recent Labs Lab 08/26/16 0002  INR 1.11   Urine analysis:    Component Value Date/Time   COLORURINE AMBER (A) 08/25/2016 0547   APPEARANCEUR CLEAR 08/25/2016 0547   LABSPEC 1.019  08/25/2016 0547   PHURINE 7.0 08/25/2016 0547   GLUCOSEU NEGATIVE 08/25/2016 0547   HGBUR NEGATIVE 08/25/2016 0547   BILIRUBINUR NEGATIVE 08/25/2016 0547   KETONESUR NEGATIVE 08/25/2016 0547   PROTEINUR NEGATIVE 08/25/2016 0547   UROBILINOGEN 1.0 08/06/2015 1535   NITRITE NEGATIVE 08/25/2016 0547   LEUKOCYTESUR SMALL (A) 08/25/2016 0547    Recent Results (from the past 240 hour(s))  MRSA PCR Screening     Status: None   Collection Time: 08/26/16 12:03 AM  Result Value Ref Range Status   MRSA by PCR NEGATIVE NEGATIVE Final    Comment:        The GeneXpert MRSA Assay (FDA approved for NASAL specimens only), is one component of a comprehensive MRSA colonization surveillance program. It is not intended to diagnose MRSA infection nor to guide or monitor treatment for MRSA infections.       Radiology Studies: No results found.    Scheduled Meds: . folic acid  1 mg Oral Daily  . levETIRAcetam  500 mg Intravenous BID  . LORazepam  0-4 mg Intramuscular Q12H  . multivitamin with minerals  1 tablet Oral Daily  . phenytoin (DILANTIN) IV  100 mg Intravenous Q8H  . thiamine  100 mg Oral Daily   Or  . thiamine  100 mg Intravenous Daily   Continuous Infusions: . sodium chloride 75 mL/hr at 08/28/16 0956  . dexmedetomidine 0.5 mcg/kg/hr (08/28/16 1200)     LOS: 2 days    Time spent: 20 minutes    Debbora Presto, MD [facility] Pager [phone]  If 7PM-7AM, please contact night-coverage [email or web address] Password [email or web address] 08/28/2016, 1:09 PM

## 2016-08-29 ENCOUNTER — Inpatient Hospital Stay (HOSPITAL_COMMUNITY)
Admission: EM | Admit: 2016-08-29 | Discharge: 2016-08-29 | Disposition: A | Discharge status: Home / Self Care | Payer: Medicaid Other | Source: Home / Self Care | Attending: Internal Medicine | Admitting: Internal Medicine

## 2016-08-29 DIAGNOSIS — F10231 Alcohol dependence with withdrawal delirium: Secondary | ICD-10-CM

## 2016-08-29 DIAGNOSIS — R569 Unspecified convulsions: Secondary | ICD-10-CM

## 2016-08-29 LAB — COMPREHENSIVE METABOLIC PANEL
ALBUMIN: 3.6 g/dL (ref 3.5–5.0)
ALT: 34 U/L (ref 17–63)
ANION GAP: 9 (ref 5–15)
AST: 60 U/L — AB (ref 15–41)
Alkaline Phosphatase: 64 U/L (ref 38–126)
BUN: 10 mg/dL (ref 6–20)
CHLORIDE: 100 mmol/L — AB (ref 101–111)
CO2: 26 mmol/L (ref 22–32)
Calcium: 9.2 mg/dL (ref 8.9–10.3)
Creatinine, Ser: 0.79 mg/dL (ref 0.61–1.24)
GFR calc Af Amer: >60 mL/min (ref >60)
GFR calc non Af Amer: >60 mL/min (ref >60)
GLUCOSE: 105 mg/dL — AB (ref 65–99)
POTASSIUM: 3.4 mmol/L — AB (ref 3.5–5.1)
SODIUM: 135 mmol/L (ref 135–145)
TOTAL PROTEIN: 7.4 g/dL (ref 6.5–8.1)
Total Bilirubin: 0.7 mg/dL (ref 0.3–1.2)

## 2016-08-29 LAB — CBC
HEMATOCRIT: 34.8 % — AB (ref 39.0–52.0)
HEMOGLOBIN: 11.8 g/dL — AB (ref 13.0–17.0)
MCH: 31.4 pg (ref 26.0–34.0)
MCHC: 33.9 g/dL (ref 30.0–36.0)
MCV: 92.6 fL (ref 78.0–100.0)
Platelets: 159 10*3/uL (ref 150–400)
RBC: 3.76 MIL/uL — ABNORMAL LOW (ref 4.22–5.81)
RDW: 14.6 % (ref 11.5–15.5)
WBC: 4.7 10*3/uL (ref 4.0–10.5)

## 2016-08-29 MED ORDER — HYDRALAZINE HCL 20 MG/ML IJ SOLN: 5.0000 mg | INTRAMUSCULAR | Status: DC | PRN

## 2016-08-29 MED ORDER — BENZTROPINE MESYLATE 1 MG PO TABS: 1.0000 mg | ORAL_TABLET | Freq: Two times a day (BID) | ORAL | 0 refills | Status: DC

## 2016-08-29 NOTE — Discharge Instructions (Signed)
Read the information below.  Use the prescribed medication as directed.  Please discuss all new medications with your pharmacist.  You may return to the Emergency Department at any time for worsening condition or any new symptoms that concern you.   I have given you prescriptions to refill your seizure medications.  Please take your medications as your doctor has directed.  Please also take you psychiatric and other medications.  Follow up with primary care provider as soon as possible.     Seizure, Adult A seizure means there is unusual activity in the brain. A seizure can cause changes in attention or behavior. Seizures often cause shaking (convulsions). Seizures often last from 30 seconds to 2 minutes. HOME CARE   If you are given medicines, take them exactly as told by your doctor.  Keep all doctor visits as told.  Do not swim or drive until your doctor says it is okay.  Teach others what to do if you have a seizure. They should:  Lay you on the ground.  Put a cushion under your head.  Loosen any tight clothing around your neck.  Turn you on your side.  Stay with you until you get better. GET HELP RIGHT AWAY IF:   The seizure lasts longer than 2 to 5 minutes.  The seizure is very bad.  The person does not wake up after the seizure.  The person's attention or behavior changes. Drive the person to the emergency room or call your local emergency services (911 in [place] MAKE SURE YOU:   Understand these instructions.  Will watch your condition.  Will get help right away if you are not doing well or get worse.   This information is not intended to replace advice given to you by your health care provider. Make sure you discuss any questions you have with your health care provider.   Document Released: 05/29/2008 Document Revised: 03/04/2012 Document Reviewed: 07/23/2013 Elsevier Interactive Patient Education [employer]2016 Elsevier Inc.

## 2016-08-29 NOTE — Procedures (Signed)
ELECTROENCEPHALOGRAM REPORT  Date of Study: 08/29/2016  Patient's Name: [name] MRN: [id number] Date of Birth: November 11, 1976  Referring Provider: Dr. Danie BinderIskra Myers  Clinical History: This is a 40 year old man with a history of alcohol dependence, seizure disorder, schizophrenia, medication non-compliance, with several witnessed seizures.  Medications: phenytoin (DILANTIN) injection 100 mg  levETIRAcetam (KEPPRA) 500 mg in sodium chloride 0.9 % 100 mL IVPB  acetaminophen (TYLENOL) tablet 650 mg  folic acid (FOLVITE) tablet 1 mg  thiamine (B-1) injection 100 mg   Technical Summary: A multichannel digital EEG recording measured by the international 10-20 system with electrodes applied with paste and impedances below 5000 ohms performed in our laboratory with EKG monitoring in an awake patient.  Hyperventilation and photic stimulation were not performed.  The digital EEG was referentially recorded, reformatted, and digitally filtered in a variety of bipolar and referential montages for optimal display.    Description: The patient is awake during the recording.  During maximal wakefulness, there is a symmetric, medium voltage 8-9 Hz posterior dominant rhythm that attenuates with eye opening.  The record is symmetric.  Hyperventilation and photic stimulation were not performed.  There were no epileptiform discharges or electrographic seizures seen.    EKG lead was unremarkable.  Impression: This awake EEG is normal.    Clinical Correlation: A normal EEG does not exclude a clinical diagnosis of epilepsy.  Clinical correlation is advised.   Patrcia DollyKaren Laiba Fuerte, M.D.

## 2016-08-29 NOTE — Progress Notes (Signed)
LCSWA was requested to meet with patient before going AMA. LCSWA  met with patient to encourage patient to continue with treatment and about substance abuse treatment programs. Patient report he drinks at least three beers a day. He reports several stressors that contribute to his drinking. LCSWA  Encourage the patient to follow up with [facility] and Alcohol and Drug services.

## 2016-08-29 NOTE — Discharge Summary (Signed)
Physician Discharge Summary  Rock Sobol ZOX:[id number] DOB: 06/07/1976 DOA: 08/25/2016  PCP: Nilda Simmer, MD  Admit date: 08/25/2016 Discharge date: 08/29/2016  Recommendations for Outpatient Follow-up:  1. Pt will need to follow up with PCP in 2-3 weeks post discharge 2. Please obtain BMP to evaluate electrolytes and kidney function 3. Please also check CBC to evaluate Hg and Hct levels 4. Please also note that pt was advised to follow up with psychiatry in an outpatient setting 5. Pt knows how to contract for safety, currently denies SI 6. Spoke with sister who is POA, she agrees with plan and will pick up her brother, she has my information for get in touch with me for any questions   Discharge Diagnoses:  Principal Problem:   Alcohol withdrawal (HCC) Active Problems:   S/P left THA, AA   Seizure disorder (HCC)  Discharge Condition: Stable  Diet recommendation: Heart healthy diet discussed in details   Brief Narrative:  40 y.o.gentleman with a history of alcohol dependence, seizure disorder, schizophrenia, and medical noncompliance who was brought to the ED after having several witnessed seizures in the [facility] store.   Major events since admission: 9/3 - became more agitated and restless on the floor, required transfer to SDU and placement on Precedex drip 9/4 - still on precedex drip, unable to taper off, turned of at night time 9/5 - off precedex drip   Assessment & Plan:   Principal Problem:   Seizure disorder (HCC) - in the setting of medical non compliance - confirmed by sub therapeutic Dilantin level - EEG with no evidence of seizures  - continue AED regimen  - no seizures since admission  - pt wants to go home   Active Problems:   S/P left THA, AA   Alcohol use disorder, severe, dependence (HCC) with alcohol withdrawal - did not respond to CIWA ativan and has required 4 point restraints - has been on Precedex drip, much better this AM, off precedex drip,  wants to go home  - spoke with psychiatrist on call, recommended outpatient follow up and discussion with family - discussed with sister over the phone and she has agreed with plan     Hx of schizophrenia - appreciate psych input    Hypoglycemia   - tolerating regular diet well  - resolved     Fever - no clear etiology - no cardiopulmonary concerns, no specific urinary concerns - no fevers since admission, WBC is WNL    Transaminitis - alcohol induced hepatitis    Hypokalemia - supplemented this AM    Thrombocytopenia - possibly from alcohol induced bone marrow damage - no signs of bleeding, Plt stable  DVT prophylaxis: SCD's Code Status: Full  Family Communication: Patient at bedside, sister over the phone Disposition Plan: home   Consultants:   None  Procedures:   None  Antimicrobials:   None    Procedures/Studies: Ct Head Wo Contrast  Result Date: 08/25/2016 CLINICAL DATA:  Seizure.  Fall. EXAM: CT HEAD WITHOUT CONTRAST TECHNIQUE: Contiguous axial images were obtained from the base of the skull through the vertex without intravenous contrast. COMPARISON:  05/23/2016 FINDINGS: CT HEAD FINDINGS Brain: Cerebral and cerebellar atrophy for age. No mass lesion, hemorrhage, hydrocephalus, acute infarct, intra-axial, or extra-axial fluid collection. Vascular: No hyperdense vessel or unexpected calcification. Skull: No significant soft tissue swelling.  No skull fracture. Sinuses/Orbits: Normal orbits and globes. Clear paranasal sinuses and mastoid air cells. Other: None CT CERVICAL SPINE FINDINGS Spinal visualization through the  bottom of T1. Prevertebral soft tissues are within normal limits. No apical pneumothorax. Skull base intact. Maintenance of vertebral body height. Straightening of expected cervical lordosis. Degenerative disc disease and spondylosis at C4-5 and 5-6. Facets are well-aligned. Coronal reformats demonstrate a normal C1-C2 articulation.  IMPRESSION: 1. No acute intracranial abnormality. Age advanced cerebral and cerebellar atrophy. 2. No acute fracture or subluxation in the cervical spine. Straightening of expected cervical lordosis could be positional, due to muscular spasm, or ligamentous injury. Electronically Signed   By: Jeronimo Greaves M.D.   On: 08/25/2016 16:49   Ct Cervical Spine Wo Contrast  Result Date: 08/25/2016 CLINICAL DATA:  Seizure.  Fall. EXAM: CT HEAD WITHOUT CONTRAST TECHNIQUE: Contiguous axial images were obtained from the base of the skull through the vertex without intravenous contrast. COMPARISON:  05/23/2016 FINDINGS: CT HEAD FINDINGS Brain: Cerebral and cerebellar atrophy for age. No mass lesion, hemorrhage, hydrocephalus, acute infarct, intra-axial, or extra-axial fluid collection. Vascular: No hyperdense vessel or unexpected calcification. Skull: No significant soft tissue swelling.  No skull fracture. Sinuses/Orbits: Normal orbits and globes. Clear paranasal sinuses and mastoid air cells. Other: None CT CERVICAL SPINE FINDINGS Spinal visualization through the bottom of T1. Prevertebral soft tissues are within normal limits. No apical pneumothorax. Skull base intact. Maintenance of vertebral body height. Straightening of expected cervical lordosis. Degenerative disc disease and spondylosis at C4-5 and 5-6. Facets are well-aligned. Coronal reformats demonstrate a normal C1-C2 articulation. IMPRESSION: 1. No acute intracranial abnormality. Age advanced cerebral and cerebellar atrophy. 2. No acute fracture or subluxation in the cervical spine. Straightening of expected cervical lordosis could be positional, due to muscular spasm, or ligamentous injury. Electronically Signed   By: Jeronimo Greaves M.D.   On: 08/25/2016 16:49    Discharge Exam: Vitals:   08/29/16 1300 08/29/16 1400  BP:  (!) 190/137  Pulse: 77 90  Resp: 16 14  Temp:     Vitals:   08/29/16 1100 08/29/16 1200 08/29/16 1300 08/29/16 1400  BP:  (!)  158/116  (!) 190/137  Pulse: 80 87 77 90  Resp: 15 13 16 14   Temp:  98.9 F (37.2 C)    TempSrc:  Oral    SpO2: 100% 100% 100% 100%  Weight:      Height:        General: Pt is alert, follows commands appropriately, not in acute distress Cardiovascular: Regular rate and rhythm, S1/S2 +, no murmurs, no rubs, no gallops Respiratory: Clear to auscultation bilaterally, no wheezing, no crackles, no rhonchi Abdominal: Soft, non tender, non distended, bowel sounds +, no guarding Extremities: no edema, no cyanosis, pulses palpable bilaterally DP and PT Neuro: Grossly nonfocal  Discharge Instructions  Discharge Instructions    Diet - low sodium heart healthy    Complete by:  As directed   Increase activity slowly    Complete by:  As directed       Medication List    STOP taking these medications   cyclobenzaprine 10 MG tablet Commonly known as:  FLEXERIL   DSS 100 MG Caps   ibuprofen 800 MG tablet Commonly known as:  ADVIL,MOTRIN   traZODone 50 MG tablet Commonly known as:  DESYREL     TAKE these medications   benztropine 1 MG tablet Commonly known as:  COGENTIN Take 1 tablet (1 mg total) by mouth 2 (two) times daily.   EPINEPHrine 0.3 mg/0.3 mL Soaj injection Commonly known as:  EPI-PEN Inject 0.3 mLs (0.3 mg total) into the muscle as  needed.   haloperidol 5 MG tablet Commonly known as:  HALDOL Take 1 tablet (5 mg total) by mouth daily with breakfast.   haloperidol 10 MG tablet Commonly known as:  HALDOL Take one tablet (10 mg) at bedtime.   haloperidol decanoate 100 MG/ML injection Commonly known as:  HALDOL DECANOATE Inject 0.5 mLs (50 mg total) into the muscle every 21 ( twenty-one) days.   levETIRAcetam 500 MG tablet Commonly known as:  KEPPRA Take 1 tablet (500 mg total) by mouth 2 (two) times daily.   phenytoin 100 MG ER capsule Commonly known as:  DILANTIN Take 1 capsule (100 mg total) by mouth daily.   phenytoin 200 MG ER capsule Commonly known  as:  DILANTIN Take 1 capsule (200 mg total) by mouth at bedtime.      Follow-up Information    Schedule an appointment as soon as possible for a visit today with Nilda SimmerSMITH,KRISTI, MD.   Specialty:  Family Medicine Contact information: [address] [place] [address] [phone]        Debbora PrestoMAGICK-Maebelle Sulton, MD .   Specialty:  Internal Medicine Contact information: [address] [place] [address] [phone]            The results of significant diagnostics from this hospitalization (including imaging, microbiology, ancillary and laboratory) are listed below for reference.     Microbiology: Recent Results (from the past 240 hour(s))  MRSA PCR Screening     Status: None   Collection Time: 08/26/16 12:03 AM  Result Value Ref Range Status   MRSA by PCR NEGATIVE NEGATIVE Final    Comment:        The GeneXpert MRSA Assay (FDA approved for NASAL specimens only), is one component of a comprehensive MRSA colonization surveillance program. It is not intended to diagnose MRSA infection nor to guide or monitor treatment for MRSA infections.      Labs: Basic Metabolic Panel:  Recent Labs Lab 08/25/16 1338 08/25/16 1730 08/27/16 0851 08/28/16 0322 08/29/16 0331  NA 140 138 135 138 135  K 4.7 3.5 3.0* 3.8 3.4*  CL 103 104 102 106 100*  CO2 17* 23 24 21* 26  GLUCOSE 131* 102* 74 60* 105*  BUN 7 <5* <5* 6 10  CREATININE 0.96 0.77 0.65 0.58* 0.79  CALCIUM 9.3 8.5* 9.2 8.6* 9.2   Liver Function Tests:  Recent Labs Lab 08/25/16 1338 08/28/16 0322 08/29/16 0331  AST 205* 68* 60*  ALT 64* 37 34  ALKPHOS 66 67 64  BILITOT 0.4 1.1 0.7  PROT 8.4* 7.3 7.4  ALBUMIN 4.1 3.6 3.6   CBC:  Recent Labs Lab 08/25/16 1338 08/27/16 0851 08/28/16 0322 08/29/16 0331  WBC 5.7 5.4 4.4 4.7  HGB 11.9* 11.8* 12.6* 11.8*  HCT 35.4* 34.6* 37.3* 34.8*  MCV 94.7 93.5 93.5 92.6  PLT 160 119* 129* 159   SIGNED: Time coordinating discharge: 30  minutes  MAGICK-Irmalee Riemenschneider, MD  [facility] 08/29/2016, 4:22 PM Pager [phone]  If 7PM-7AM, please contact night-coverage [email or web address] Password [email or web address]

## 2016-08-29 NOTE — Progress Notes (Signed)
EEG completed, results pending. 

## 2016-08-29 NOTE — Progress Notes (Signed)
Patient ID: [name], male   DOB: Feb 21, 1976, 40 y.o.   MRN: [id number]   PROGRESS NOTE    [name]  WUJ:[id number] DOB: 28-Jul-1976 DOA: 08/25/2016  PCP: Nilda Simmer, MD   Brief Narrative:  40 y.o. gentleman with a history of alcohol dependence, seizure disorder, schizophrenia, and medical noncompliance who was brought to the ED after having several witnessed seizures in the [facility] store.    Major events since admission: 9/3 - became more agitated and restless on the floor, required transfer to SDU and placement on Precedex drip 9/4 - still on precedex drip, unable to taper off   Assessment & Plan:   Principal Problem:   Seizure disorder (HCC) - in the setting of medical non compliance - confirmed by sub therapeutic Dilantin level - EEG still pending  - continue AED regimen  - no seizures since admission   Active Problems:   S/P left THA, AA   Alcohol use disorder, severe, dependence (HCC) with alcohol withdrawal - did not respond to CIWA ativan and has required 4 point restraints - has been on Precedex drip, monitored in SDU  - now off precedex drip for the past 24 hours and looks stable this AM - keep in SDU today and if pt better can potentially be d/c in AM    Hx of schizophrenia - appreciate psych input, needs evaluation as now he is more alert     Hypoglycemia   - tolerating regular diet well     Fever - no clear etiology - no cardiopulmonary concerns, no specific urinary concerns - no fevers since admission, WBC is WNL    Transaminitis - alcohol induced hepatitis - CMET in AM    Hypokalemia - supplement - BMP in AM    Thrombocytopenia - possibly from alcohol induced bone marrow damage - no signs of bleeding, Plt stable  DVT prophylaxis: SCD's Code Status: Full  Family Communication: Patient at bedside,no family at bedside this AM Disposition Plan: home in AM  Consultants:   None  Procedures:   None  Antimicrobials:   None    Subjective: Alert and pleasant.   Objective: Vitals:   08/29/16 0400 08/29/16 0700 08/29/16 0800 08/29/16 1200  BP:  136/85 (!) 152/119   Pulse:  99 87   Resp:  14    Temp: 98.6 F (37 C)  99 F (37.2 C) 98.9 F (37.2 C)  TempSrc: Oral  Oral Oral  SpO2:  100% 99%   Weight:      Height:        Intake/Output Summary (Last 24 hours) at 08/29/16 1422 Last data filed at 08/29/16 1200  Gross per 24 hour  Intake          1252.33 ml  Output             1810 ml  Net          -557.67 ml   Filed Weights   08/25/16 1311 08/26/16 0000 08/27/16 2141  Weight: 68 kg (150 lb) 68.6 kg (151 lb 3.8 oz) 68.8 kg (151 lb 10.8 oz)    Examination:  General exam: Appears calm and alert, NAD Respiratory system: Clear to auscultation. Respiratory effort normal. Cardiovascular system: S1 & S2 heard, tachycardic. No JVD, murmurs, rubs, gallops or clicks. No pedal edema. Gastrointestinal system: Abdomen is nondistended, soft and nontender. No organomegaly or masses felt.  Central nervous system: Non focal  Data Reviewed: I have personally reviewed following labs and imaging studies  CBC:  Recent Labs Lab 08/25/16 1338 08/27/16 0851 08/28/16 0322 08/29/16 0331  WBC 5.7 5.4 4.4 4.7  HGB 11.9* 11.8* 12.6* 11.8*  HCT 35.4* 34.6* 37.3* 34.8*  MCV 94.7 93.5 93.5 92.6  PLT 160 119* 129* 159   Basic Metabolic Panel:  Recent Labs Lab 08/25/16 1338 08/25/16 1730 08/27/16 0851 08/28/16 0322 08/29/16 0331  NA 140 138 135 138 135  K 4.7 3.5 3.0* 3.8 3.4*  CL 103 104 102 106 100*  CO2 17* 23 24 21* 26  GLUCOSE 131* 102* 74 60* 105*  BUN 7 <5* <5* 6 10  CREATININE 0.96 0.77 0.65 0.58* 0.79  CALCIUM 9.3 8.5* 9.2 8.6* 9.2   Liver Function Tests:  Recent Labs Lab 08/25/16 1338 08/28/16 0322 08/29/16 0331  AST 205* 68* 60*  ALT 64* 37 34  ALKPHOS 66 67 64  BILITOT 0.4 1.1 0.7  PROT 8.4* 7.3 7.4  ALBUMIN 4.1 3.6 3.6   Coagulation Profile:  Recent Labs Lab 08/26/16 0002   INR 1.11   Urine analysis:    Component Value Date/Time   COLORURINE AMBER (A) 08/25/2016 0547   APPEARANCEUR CLEAR 08/25/2016 0547   LABSPEC 1.019 08/25/2016 0547   PHURINE 7.0 08/25/2016 0547   GLUCOSEU NEGATIVE 08/25/2016 0547   HGBUR NEGATIVE 08/25/2016 0547   BILIRUBINUR NEGATIVE 08/25/2016 0547   KETONESUR NEGATIVE 08/25/2016 0547   PROTEINUR NEGATIVE 08/25/2016 0547   UROBILINOGEN 1.0 08/06/2015 1535   NITRITE NEGATIVE 08/25/2016 0547   LEUKOCYTESUR SMALL (A) 08/25/2016 0547    Recent Results (from the past 240 hour(s))  MRSA PCR Screening     Status: None   Collection Time: 08/26/16 12:03 AM  Result Value Ref Range Status   MRSA by PCR NEGATIVE NEGATIVE Final    Comment:        The GeneXpert MRSA Assay (FDA approved for NASAL specimens only), is one component of a comprehensive MRSA colonization surveillance program. It is not intended to diagnose MRSA infection nor to guide or monitor treatment for MRSA infections.       Radiology Studies: No results found.    Scheduled Meds: . folic acid  1 mg Oral Daily  . levETIRAcetam  500 mg Intravenous BID  . multivitamin with minerals  1 tablet Oral Daily  . phenytoin (DILANTIN) IV  100 mg Intravenous Q8H  . thiamine  100 mg Oral Daily   Or  . thiamine  100 mg Intravenous Daily   Continuous Infusions: . dexmedetomidine Stopped (08/28/16 1817)  . dextrose 75 mL/hr at 08/28/16 1418     LOS: 3 days    Time spent: 20 minutes    Debbora PrestoMAGICK-Crewe Heathman, MD [facility] Pager [phone]  If 7PM-7AM, please contact night-coverage [email or web address] Password [email or web address] 08/29/2016, 2:22 PM

## 2016-12-26 ENCOUNTER — Emergency Department (HOSPITAL_COMMUNITY)
Admission: EM | Admit: 2016-12-26 | Discharge: 2016-12-26 | Disposition: A | Discharge status: Home / Self Care | Payer: Medicaid Other | Attending: Emergency Medicine | Admitting: Emergency Medicine

## 2016-12-26 ENCOUNTER — Encounter (HOSPITAL_COMMUNITY): Payer: Self-pay | Admitting: Emergency Medicine

## 2016-12-26 DIAGNOSIS — F1010 Alcohol abuse, uncomplicated: Secondary | ICD-10-CM | POA: Diagnosis not present

## 2016-12-26 DIAGNOSIS — Z96642 Presence of left artificial hip joint: Secondary | ICD-10-CM | POA: Insufficient documentation

## 2016-12-26 DIAGNOSIS — Z760 Encounter for issue of repeat prescription: Secondary | ICD-10-CM | POA: Diagnosis not present

## 2016-12-26 DIAGNOSIS — G40909 Epilepsy, unspecified, not intractable, without status epilepticus: Secondary | ICD-10-CM

## 2016-12-26 DIAGNOSIS — F17210 Nicotine dependence, cigarettes, uncomplicated: Secondary | ICD-10-CM | POA: Diagnosis not present

## 2016-12-26 LAB — CBC WITH DIFFERENTIAL/PLATELET
BASOS ABS: 0 10*3/uL (ref 0.0–0.1)
BASOS PCT: 0 %
EOS ABS: 0 10*3/uL (ref 0.0–0.7)
Eosinophils Relative: 1 %
HEMATOCRIT: 39.2 % (ref 39.0–52.0)
HEMOGLOBIN: 12.9 g/dL — AB (ref 13.0–17.0)
Lymphocytes Relative: 21 %
Lymphs Abs: 0.9 10*3/uL (ref 0.7–4.0)
MCH: 29.9 pg (ref 26.0–34.0)
MCHC: 32.9 g/dL (ref 30.0–36.0)
MCV: 91 fL (ref 78.0–100.0)
MONOS PCT: 20 %
Monocytes Absolute: 0.9 10*3/uL (ref 0.1–1.0)
NEUTROS ABS: 2.6 10*3/uL (ref 1.7–7.7)
Neutrophils Relative %: 58 %
Platelets: 203 10*3/uL (ref 150–400)
RBC: 4.31 MIL/uL (ref 4.22–5.81)
RDW: 14.6 % (ref 11.5–15.5)
WBC: 4.5 10*3/uL (ref 4.0–10.5)

## 2016-12-26 LAB — BASIC METABOLIC PANEL
ANION GAP: 12 (ref 5–15)
BUN: 9 mg/dL (ref 6–20)
CHLORIDE: 99 mmol/L — AB (ref 101–111)
CO2: 26 mmol/L (ref 22–32)
Calcium: 9.8 mg/dL (ref 8.9–10.3)
Creatinine, Ser: 0.77 mg/dL (ref 0.61–1.24)
GFR calc non Af Amer: >60 mL/min (ref >60)
Glucose, Bld: 106 mg/dL — ABNORMAL HIGH (ref 65–99)
POTASSIUM: 4 mmol/L (ref 3.5–5.1)
SODIUM: 137 mmol/L (ref 135–145)

## 2016-12-26 LAB — PHENYTOIN LEVEL, TOTAL: Phenytoin Lvl: <2.5 ug/mL — ABNORMAL LOW (ref 10.0–20.0)

## 2016-12-26 MED ORDER — LEVETIRACETAM 500 MG PO TABS
500.0000 mg | ORAL_TABLET | Freq: Once | ORAL | Status: AC
  Administered 2016-12-26: 500 mg via ORAL
  Filled 2016-12-26: qty 1

## 2016-12-26 MED ORDER — PHENYTOIN SODIUM EXTENDED 200 MG PO CAPS: 200.0000 mg | ORAL_CAPSULE | Freq: Every day | ORAL | 0 refills | Status: DC

## 2016-12-26 MED ORDER — LEVETIRACETAM 500 MG PO TABS: 500.0000 mg | ORAL_TABLET | Freq: Two times a day (BID) | ORAL | 0 refills | Status: DC

## 2016-12-26 MED ORDER — PHENYTOIN SODIUM EXTENDED 100 MG PO CAPS: 100.0000 mg | ORAL_CAPSULE | Freq: Every day | ORAL | 0 refills | Status: DC

## 2016-12-26 MED ORDER — SODIUM CHLORIDE 0.9 % IV SOLN
1000.0000 mg | Freq: Once | INTRAVENOUS | Status: AC
  Administered 2016-12-26: 1000 mg via INTRAVENOUS
  Filled 2016-12-26: qty 20

## 2016-12-26 NOTE — Discharge Instructions (Signed)
STOP DRINKING ALCOHOL! Take your medications like you're instructed to. Stay hydrated with plenty of water. Follow up with [facility] in 1 week to establish medical care. Follow up with [facility] for ongoing management of your seizure and schizophrenia medications. Return to the ER for changes or worsening symptoms

## 2016-12-26 NOTE — ED Notes (Signed)
PT state he have no more of his med and feel as if he is about to have seizure.

## 2016-12-26 NOTE — ED Notes (Signed)
Per PA, patient is ready to D/C.

## 2016-12-26 NOTE — ED Provider Notes (Signed)
[facility]-EMERGENCY DEPT Provider Note   CSN: [id number] Arrival date & time: 12/26/16  1033     History   Chief Complaint Chief Complaint  Patient presents with  . possible seizure    HPI [name] is a 41 y.o. male with a PMHx of substance abuse, alcohol abuse, epilepsy, AVN L hip, medical noncompliance, and schizophrenia, who presents to the ED with complaints of needing medication refill of his seizure medicines. Patient states that he has been out of his Dilantin and Keppra for approx 4-5 months, he supposed to go to [facility] for his medications but he doesn't have any refills so he hasn't gone. He cannot recall the doses of his medicines, but states that it hasn't changed since his last admission in September 2017. Confirmed that he takes Dilantin and Keppra. Additionally he reports that he is supposed to be on Haldol and Cogentin for his schizophrenia. He denies any recent seizures, states that his last seizure was in September when he was admitted, but states he "feels like he's going to have a seizure". Admits to drinking alcohol yesterday, states it was "a couple beers". Denies any other complaints at this time, including fevers, chills, HA, vision changes, lightheadedness, tongue biting, seizure like activity, confusion, CP, SOB, abd pain, N/V/D/C, hematuria, dysuria, myalgias, arthralgias, numbness, tingling, weakness, or any other complaints at this time. Has not yet followed up with finding a PCP.    The history is provided by the patient and medical records. No language interpreter was used.    Past Medical History:  Diagnosis Date  . Arthritis    hands  . Avascular necrosis (HCC)    L HIP  . Avascular necrosis of hip (HCC)    left  . Schizophrenia (HCC)   . Seizures (HCC)    HX [place] seiaures - last seizure 6-7 months ago  . Substance abuse    has not used for 5-6 yrs    Patient Active Problem List   Diagnosis Date Noted  . Alcohol withdrawal (HCC) 08/25/2016    . H/O noncompliance with medical treatment, presenting hazards to health 08/25/2016  . Seizure (HCC) 08/25/2016  . Paranoid schizophrenia (HCC) 08/11/2015  . Seizure disorder (HCC) 08/10/2015  . Alcohol use disorder, severe, dependence (HCC) 08/10/2015  . Hx of schizophrenia   . Expected blood loss anemia 04/30/2013  . Overweight (BMI 25.0-29.9) 04/30/2013  . S/P left THA, AA 04/29/2013    Past Surgical History:  Procedure Laterality Date  . NO PAST SURGERIES    . TOTAL HIP ARTHROPLASTY Left 04/29/2013   Procedure: TOTAL HIP ARTHROPLASTY ANTERIOR APPROACH;  Surgeon: Shelda Pal, MD;  Location: [facility] ORS;  Service: Orthopedics;  Laterality: Left;       Home Medications    Prior to Admission medications   Medication Sig Start Date End Date Taking? Authorizing Provider  benztropine (COGENTIN) 1 MG tablet Take 1 tablet (1 mg total) by mouth 2 (two) times daily. 08/29/16   Dorothea Ogle, MD  EPINEPHrine 0.3 mg/0.3 mL IJ SOAJ injection Inject 0.3 mLs (0.3 mg total) into the muscle as needed. Patient not taking: Reported on 12/04/2015 08/16/15   Thermon Leyland, NP  haloperidol (HALDOL) 10 MG tablet Take one tablet (10 mg) at bedtime. Patient not taking: Reported on 12/04/2015 08/16/15   Thermon Leyland, NP  haloperidol (HALDOL) 5 MG tablet Take 1 tablet (5 mg total) by mouth daily with breakfast. Patient not taking: Reported on 12/04/2015 08/16/15   Vernona Rieger  Jenness CornerA Davis, NP  haloperidol decanoate (HALDOL DECANOATE) 100 MG/ML injection Inject 0.5 mLs (50 mg total) into the muscle every 21 ( twenty-one) days. Patient not taking: Reported on 12/04/2015 09/06/15   Thermon LeylandLaura A Davis, NP  levETIRAcetam (KEPPRA) 500 MG tablet Take 1 tablet (500 mg total) by mouth 2 (two) times daily. 08/25/16   Trixie DredgeEmily West, PA-C  phenytoin (DILANTIN) 100 MG ER capsule Take 1 capsule (100 mg total) by mouth daily. 08/25/16   Trixie DredgeEmily West, PA-C  phenytoin (DILANTIN) 200 MG ER capsule Take 1 capsule (200 mg total) by mouth at bedtime.  08/25/16   Trixie DredgeEmily West, PA-C    Family History Family History  Problem Relation Age of Onset  . Cancer Brother   . Diabetes Mother   . Hypertension Mother   . Cancer Father   . Hypertension Father   . Hypertension Sister   . Diabetes Maternal Grandmother     Social History Social History  Substance Use Topics  . Smoking status: Current Every Day Smoker    Packs/day: 1.50    Years: 5.00    Types: Cigarettes  . Smokeless tobacco: Never Used  . Alcohol use 3.6 oz/week    6 Cans of beer per week     Comment: Pt. states that he drinks beer every couple of days 2 (12 oz cans)     Allergies   Bee venom   Review of Systems Review of Systems  Constitutional: Negative for chills and fever.  HENT: Negative for mouth sores (no tongue biting).   Eyes: Negative for visual disturbance.  Respiratory: Negative for shortness of breath.   Cardiovascular: Negative for chest pain.  Gastrointestinal: Negative for abdominal pain, constipation, diarrhea, nausea and vomiting.  Genitourinary: Negative for dysuria and hematuria.  Musculoskeletal: Negative for arthralgias and myalgias.  Skin: Negative for color change.  Allergic/Immunologic: Negative for immunocompromised state.  Neurological: Negative for seizures, syncope, weakness, light-headedness, numbness and headaches.  Psychiatric/Behavioral: Negative for confusion.   10 Systems reviewed and are negative for acute change except as noted in the HPI.   Physical Exam Updated Vital Signs BP 131/91 (BP Location: Left Arm)   Pulse 79   Temp 98.9 F (37.2 C) (Oral)   Resp 15   SpO2 100%   Physical Exam  Constitutional: He is oriented to person, place, and time. Vital signs are normal. He appears well-developed and well-nourished.  Non-toxic appearance. No distress.  Afebrile, nontoxic, NAD  HENT:  Head: Normocephalic and atraumatic.  Mouth/Throat: Oropharynx is clear and moist and mucous membranes are normal.  Eyes: Conjunctivae  and EOM are normal. Pupils are equal, round, and reactive to light. Right eye exhibits no discharge. Left eye exhibits no discharge.  PERRL, EOMI, no nystagmus, no visual field deficits   Neck: Normal range of motion. Neck supple. No spinous process tenderness and no muscular tenderness present. No neck rigidity. Normal range of motion present.  FROM intact without spinous process TTP, no bony stepoffs or deformities, no paraspinous muscle TTP or muscle spasms. No rigidity or meningeal signs. No bruising or swelling.   Cardiovascular: Normal rate, regular rhythm, normal heart sounds and intact distal pulses.  Exam reveals no gallop and no friction rub.   No murmur heard. Pulmonary/Chest: Effort normal and breath sounds normal. No respiratory distress. He has no decreased breath sounds. He has no wheezes. He has no rhonchi. He has no rales.  Abdominal: Soft. Normal appearance and bowel sounds are normal. He exhibits no distension. There is  no tenderness. There is no rigidity, no rebound, no guarding, no CVA tenderness, no tenderness at McBurney's point and negative Murphy's sign.  Musculoskeletal: Normal range of motion.  MAE x4 Strength and sensation grossly intact in all extremities Distal pulses intact Gait steady  Neurological: He is alert and oriented to person, place, and time. He has normal strength. No cranial nerve deficit or sensory deficit. Coordination and gait normal. GCS eye subscore is 4. GCS verbal subscore is 5. GCS motor subscore is 6.  CN 2-12 grossly intact A&O x4 GCS 15 Sensation and strength intact Gait nonataxic including with tandem walking Coordination with finger-to-nose WNL Neg pronator drift   Skin: Skin is warm, dry and intact. No rash noted.  Psychiatric: He has a normal mood and affect.  Nursing note and vitals reviewed.    ED Treatments / Results  Labs (all labs ordered are listed, but only abnormal results are displayed) Labs Reviewed  PHENYTOIN LEVEL,  TOTAL - Abnormal; Notable for the following:       Result Value   Phenytoin Lvl <2.5 (*)    All other components within normal limits  CBC WITH DIFFERENTIAL/PLATELET - Abnormal; Notable for the following:    Hemoglobin 12.9 (*)    All other components within normal limits  BASIC METABOLIC PANEL - Abnormal; Notable for the following:    Chloride 99 (*)    Glucose, Bld 106 (*)    All other components within normal limits    EKG  EKG Interpretation None       Radiology No results found.  Procedures Procedures (including critical care time)  Medications Ordered in ED Medications  phenytoin (DILANTIN) 1,000 mg in sodium chloride 0.9 % 250 mL IVPB (1,000 mg Intravenous New Bag/Given 12/26/16 1306)  levETIRAcetam (KEPPRA) tablet 500 mg (500 mg Oral Given 12/26/16 1306)     Initial Impression / Assessment and Plan / ED Course  I have reviewed the triage vital signs and the nursing notes.  Pertinent labs & imaging results that were available during my care of the patient were reviewed by me and considered in my medical decision making (see chart for details).  Clinical Course     41 y.o. male here for med refill of his seizure meds, has been out for 4-5 months. No recent seizures, consumed alcohol yesterday and states he "feels like he's going to have a seizure". No other complaints. No focal neuro deficits, A&O x4. Labs unremarkable, phenytoin level low as expected. Will give loading dose of phenytoin and keppra, and refill meds. Advised him to f/up with [facility] in 1wk to establish care, and with [facility] for his med refills and management of schizophrenia. I explained the diagnosis and have given explicit precautions to return to the ER including for any other new or worsening symptoms. The patient understands and accepts the medical plan as it's been dictated and I have answered their questions. Discharge instructions concerning home care and prescriptions have been given. The patient is  STABLE and is discharged to home in good condition.   Final Clinical Impressions(s) / ED Diagnoses   Final diagnoses:  Encounter for medication refill  Seizure disorder (HCC)  Alcohol abuse    New Prescriptions New Prescriptions   LEVETIRACETAM (KEPPRA) 500 MG TABLET    Take 1 tablet (500 mg total) by mouth 2 (two) times daily.   PHENYTOIN (DILANTIN) 100 MG ER CAPSULE    Take 1 capsule (100 mg total) by mouth daily.   PHENYTOIN (DILANTIN)  200 MG ER CAPSULE    Take 1 capsule (200 mg total) by mouth at bedtime.     [address] [place], PA-C 12/26/16 1308    Tilden Fossa, MD 12/26/16 1921

## 2016-12-26 NOTE — ED Notes (Signed)
Informed PA that dilantin drip is still running.  Will D/C when complete.

## 2016-12-26 NOTE — ED Triage Notes (Signed)
Per pt, states he felt like he was going to have a seizure-states has not been taking meds because he ran out-[facility]states [facility] has to [facility] and get [facility] and he just hasnt

## 2017-03-05 ENCOUNTER — Encounter (HOSPITAL_COMMUNITY): Payer: Self-pay | Admitting: Emergency Medicine

## 2017-03-05 ENCOUNTER — Emergency Department (HOSPITAL_COMMUNITY): Payer: Medicaid Other

## 2017-03-05 ENCOUNTER — Emergency Department (HOSPITAL_COMMUNITY)
Admission: EM | Admit: 2017-03-05 | Discharge: 2017-03-06 | Disposition: A | Discharge status: Home / Self Care | Payer: Medicaid Other | Attending: Emergency Medicine | Admitting: Emergency Medicine

## 2017-03-05 DIAGNOSIS — R10817 Generalized abdominal tenderness: Secondary | ICD-10-CM | POA: Insufficient documentation

## 2017-03-05 DIAGNOSIS — Z96642 Presence of left artificial hip joint: Secondary | ICD-10-CM | POA: Diagnosis not present

## 2017-03-05 DIAGNOSIS — R892 Abnormal level of other drugs, medicaments and biological substances in specimens from other organs, systems and tissues: Secondary | ICD-10-CM | POA: Insufficient documentation

## 2017-03-05 DIAGNOSIS — R112 Nausea with vomiting, unspecified: Secondary | ICD-10-CM | POA: Diagnosis present

## 2017-03-05 DIAGNOSIS — Z79899 Other long term (current) drug therapy: Secondary | ICD-10-CM | POA: Insufficient documentation

## 2017-03-05 DIAGNOSIS — F17210 Nicotine dependence, cigarettes, uncomplicated: Secondary | ICD-10-CM | POA: Insufficient documentation

## 2017-03-05 DIAGNOSIS — R7889 Finding of other specified substances, not normally found in blood: Secondary | ICD-10-CM

## 2017-03-05 DIAGNOSIS — E860 Dehydration: Secondary | ICD-10-CM | POA: Insufficient documentation

## 2017-03-05 LAB — CBC WITH DIFFERENTIAL/PLATELET
Basophils Absolute: 0 10*3/uL (ref 0.0–0.1)
Basophils Relative: 0 %
EOS ABS: 0.1 10*3/uL (ref 0.0–0.7)
Eosinophils Relative: 1 %
HEMATOCRIT: 37.6 % — AB (ref 39.0–52.0)
HEMOGLOBIN: 12.6 g/dL — AB (ref 13.0–17.0)
LYMPHS ABS: 1.4 10*3/uL (ref 0.7–4.0)
Lymphocytes Relative: 27 %
MCH: 30.9 pg (ref 26.0–34.0)
MCHC: 33.5 g/dL (ref 30.0–36.0)
MCV: 92.2 fL (ref 78.0–100.0)
MONO ABS: 0.6 10*3/uL (ref 0.1–1.0)
MONOS PCT: 11 %
NEUTROS ABS: 3.1 10*3/uL (ref 1.7–7.7)
NEUTROS PCT: 61 %
Platelets: 162 10*3/uL (ref 150–400)
RBC: 4.08 MIL/uL — ABNORMAL LOW (ref 4.22–5.81)
RDW: 15.4 % (ref 11.5–15.5)
WBC: 5.1 10*3/uL (ref 4.0–10.5)

## 2017-03-05 LAB — COMPREHENSIVE METABOLIC PANEL
ALK PHOS: 50 U/L (ref 38–126)
ALT: 24 U/L (ref 17–63)
ANION GAP: 11 (ref 5–15)
AST: 37 U/L (ref 15–41)
Albumin: 4 g/dL (ref 3.5–5.0)
BILIRUBIN TOTAL: 0.4 mg/dL (ref 0.3–1.2)
BUN: 10 mg/dL (ref 6–20)
CALCIUM: 9.8 mg/dL (ref 8.9–10.3)
CO2: 25 mmol/L (ref 22–32)
Chloride: 98 mmol/L — ABNORMAL LOW (ref 101–111)
Creatinine, Ser: 0.94 mg/dL (ref 0.61–1.24)
GFR calc non Af Amer: >60 mL/min (ref >60)
Glucose, Bld: 113 mg/dL — ABNORMAL HIGH (ref 65–99)
POTASSIUM: 4.2 mmol/L (ref 3.5–5.1)
Sodium: 134 mmol/L — ABNORMAL LOW (ref 135–145)
TOTAL PROTEIN: 7.1 g/dL (ref 6.5–8.1)

## 2017-03-05 LAB — LIPASE, BLOOD: LIPASE: 32 U/L (ref 11–51)

## 2017-03-05 LAB — PHENYTOIN LEVEL, TOTAL

## 2017-03-05 MED ORDER — SODIUM CHLORIDE 0.9 % IV BOLUS (SEPSIS)
1000.0000 mL | Freq: Once | INTRAVENOUS | Status: AC
  Administered 2017-03-05: 1000 mL via INTRAVENOUS

## 2017-03-05 MED ORDER — SODIUM CHLORIDE 0.9 % IV SOLN
1000.0000 mg | Freq: Once | INTRAVENOUS | Status: AC
  Administered 2017-03-05: 1000 mg via INTRAVENOUS
  Filled 2017-03-05: qty 20

## 2017-03-05 MED ORDER — IOPAMIDOL (ISOVUE-300) INJECTION 61%
INTRAVENOUS | Status: AC
  Administered 2017-03-05: 100 mL
  Filled 2017-03-05: qty 100

## 2017-03-05 MED ORDER — LORAZEPAM 2 MG/ML IJ SOLN
1.0000 mg | Freq: Once | INTRAMUSCULAR | Status: AC
  Administered 2017-03-05: 1 mg via INTRAVENOUS
  Filled 2017-03-05: qty 1

## 2017-03-05 NOTE — ED Notes (Signed)
Rinaldo Cloudamela Little called and informed me that PT currently lives with her, and she called to make sure doc/nurse both were aware that PT is a acholic and quit "cold [place]" about 5 days ago and having withdrawals, in addition to not taking the medicines he is prescribed.  Ms. Clarene DukeLittle  Requested we seek help for the PT for his addiction. Call her with any concerns [phone]

## 2017-03-05 NOTE — ED Provider Notes (Signed)
[facility]-EMERGENCY DEPT Provider Note   CSN: [id number] Arrival date & time: 03/05/17  2047     History   Chief Complaint Chief Complaint  Patient presents with  . Emesis    HPI [name] is a 41 y.o. male.  HPI   41 year old male with history of schizophrenia, seizure disorder, chronic alcoholism, who presents with nausea and vomiting. The patient states that over the last 5 days, he has had generalized abdominal pain with nausea and vomiting. He has not been able to tolerate much by mouth intake. Of note, this also correlates with him stopping drinking alcohol. He also has not been taking his seizure medicines, as he states he is out of them. He has not followed up with his neurologist. He has not had any seizures, however. He does not recall waking up and any new locations. He denies any tongue biting or loss of bowel or bladder function. His abdominal pain is crampy and generalized, worse with movement and palpation. No alleviating factors.  Past Medical History:  Diagnosis Date  . Arthritis    hands  . Avascular necrosis (HCC)    L HIP  . Avascular necrosis of hip (HCC)    left  . Schizophrenia (HCC)   . Seizures (HCC)    HX [place] seiaures - last seizure 6-7 months ago  . Substance abuse    has not used for 5-6 yrs    Patient Active Problem List   Diagnosis Date Noted  . Alcohol withdrawal (HCC) 08/25/2016  . H/O noncompliance with medical treatment, presenting hazards to health 08/25/2016  . Seizure (HCC) 08/25/2016  . Paranoid schizophrenia (HCC) 08/11/2015  . Seizure disorder (HCC) 08/10/2015  . Alcohol use disorder, severe, dependence (HCC) 08/10/2015  . Hx of schizophrenia   . Expected blood loss anemia 04/30/2013  . Overweight (BMI 25.0-29.9) 04/30/2013  . S/P left THA, AA 04/29/2013    Past Surgical History:  Procedure Laterality Date  . NO PAST SURGERIES    . TOTAL HIP ARTHROPLASTY Left 04/29/2013   Procedure: TOTAL HIP ARTHROPLASTY ANTERIOR  APPROACH;  Surgeon: Shelda Pal, MD;  Location: [facility];  Service: Orthopedics;  Laterality: Left;       Home Medications    Prior to Admission medications   Medication Sig Start Date End Date Taking? Authorizing Provider  EPINEPHrine 0.3 mg/0.3 mL IJ SOAJ injection Inject 0.3 mLs (0.3 mg total) into the muscle as needed. 08/16/15  Yes Thermon Leyland, NP  benztropine (COGENTIN) 1 MG tablet Take 1 tablet (1 mg total) by mouth 2 (two) times daily. Patient not taking: Reported on 12/26/2016 08/29/16   Dorothea Ogle, MD  chlordiazePOXIDE (LIBRIUM) 25 MG capsule 50mg  PO TID x 1D, then 25-50mg  PO BID X 1D, then 25-50mg  PO QD X 1D 03/06/17   Shaune Pollack, MD  haloperidol (HALDOL) 10 MG tablet Take one tablet (10 mg) at bedtime. Patient not taking: Reported on 12/26/2016 08/16/15   Thermon Leyland, NP  haloperidol (HALDOL) 5 MG tablet Take 1 tablet (5 mg total) by mouth daily with breakfast. Patient not taking: Reported on 12/26/2016 08/16/15   Thermon Leyland, NP  haloperidol decanoate (HALDOL DECANOATE) 100 MG/ML injection Inject 0.5 mLs (50 mg total) into the muscle every 21 ( twenty-one) days. Patient not taking: Reported on 12/26/2016 09/06/15   Thermon Leyland, NP  levETIRAcetam (KEPPRA) 500 MG tablet Take 1 tablet (500 mg total) by mouth 2 (two) times daily. 03/06/17 03/20/17  Shaune Pollack,  MD  phenytoin (DILANTIN) 100 MG ER capsule Take 1 capsule (100 mg total) by mouth daily. 03/06/17 03/20/17  Shaune Pollackameron Kedrick Mcnamee, MD  phenytoin (DILANTIN) 200 MG ER capsule Take 1 capsule (200 mg total) by mouth at bedtime. 03/06/17 03/20/17  Shaune Pollackameron Lilliann Rossetti, MD  thiamine (VITAMIN B-1) 100 MG tablet Take 1 tablet (100 mg total) by mouth daily. 03/06/17 03/20/17  Shaune Pollackameron Dianelys Scinto, MD    Family History Family History  Problem Relation Age of Onset  . Cancer Brother   . Diabetes Mother   . Hypertension Mother   . Cancer Father   . Hypertension Father   . Hypertension Sister   . Diabetes Maternal Grandmother     Social  History Social History  Substance Use Topics  . Smoking status: Current Every Day Smoker    Packs/day: 1.50    Years: 5.00    Types: Cigarettes  . Smokeless tobacco: Never Used  . Alcohol use 3.6 oz/week    6 Cans of beer per week     Comment: Pt. states that he drinks beer every couple of days 2 (12 oz cans)     Allergies   Bee venom   Review of Systems Review of Systems  Constitutional: Positive for fatigue. Negative for chills and fever.  HENT: Negative for congestion and rhinorrhea.   Eyes: Negative for visual disturbance.  Respiratory: Negative for cough, shortness of breath and wheezing.   Cardiovascular: Negative for chest pain and leg swelling.  Gastrointestinal: Positive for abdominal pain, nausea and vomiting. Negative for diarrhea.  Genitourinary: Negative for dysuria and flank pain.  Musculoskeletal: Negative for neck pain and neck stiffness.  Skin: Negative for rash and wound.  Allergic/Immunologic: Negative for immunocompromised state.  Neurological: Negative for syncope, weakness and headaches.  All other systems reviewed and are negative.    Physical Exam Updated Vital Signs BP 107/76   Pulse 85   Temp 98.1 F (36.7 C) (Oral)   Resp 18   Ht 5\' 4"  (1.626 m)   Wt 165 lb (74.8 kg)   SpO2 100%   BMI 28.32 kg/m   Physical Exam  Constitutional: He is oriented to person, place, and time. He appears well-developed and well-nourished. No distress.  HENT:  Head: Normocephalic and atraumatic.  No apparent oral trauma  Eyes: Conjunctivae are normal.  Neck: Neck supple.  Cardiovascular: Normal rate, regular rhythm and normal heart sounds.  Exam reveals no friction rub.   No murmur heard. Pulmonary/Chest: Effort normal and breath sounds normal. No respiratory distress. He has no wheezes. He has no rales.  Abdominal: Soft. Normal appearance. He exhibits no distension. There is generalized tenderness. There is no rigidity, no rebound and no guarding.   Musculoskeletal: He exhibits no edema.  Neurological: He is alert and oriented to person, place, and time. He has normal strength. No cranial nerve deficit or sensory deficit. He exhibits normal muscle tone. GCS eye subscore is 4. GCS verbal subscore is 5. GCS motor subscore is 6.  Skin: Skin is warm. Capillary refill takes less than 2 seconds.  Psychiatric: He has a normal mood and affect.  Nursing note and vitals reviewed.    ED Treatments / Results  Labs (all labs ordered are listed, but only abnormal results are displayed) Labs Reviewed  CBC WITH DIFFERENTIAL/PLATELET - Abnormal; Notable for the following:       Result Value   RBC 4.08 (*)    Hemoglobin 12.6 (*)    HCT 37.6 (*)  All other components within normal limits  COMPREHENSIVE METABOLIC PANEL - Abnormal; Notable for the following:    Sodium 134 (*)    Chloride 98 (*)    Glucose, Bld 113 (*)    All other components within normal limits  PHENYTOIN LEVEL, TOTAL - Abnormal; Notable for the following:    Phenytoin Lvl <2.5 (*)    All other components within normal limits  URINALYSIS, ROUTINE W REFLEX MICROSCOPIC - Abnormal; Notable for the following:    Ketones, ur 15 (*)    All other components within normal limits  LIPASE, BLOOD  MAGNESIUM    EKG  EKG Interpretation None       Radiology Ct Abdomen Pelvis W Contrast  Result Date: 03/06/2017 CLINICAL DATA:  41 year old male with abdominal pain and nausea and vomiting. EXAM: CT ABDOMEN AND PELVIS WITH CONTRAST TECHNIQUE: Multidetector CT imaging of the abdomen and pelvis was performed using the standard protocol following bolus administration of intravenous contrast. CONTRAST:  ISOVUE-300 IOPAMIDOL (ISOVUE-300) INJECTION 61% COMPARISON:  Pelvic radiograph dated 04/29/2013 FINDINGS: Lower chest: The visualized lung bases are clear. No intra-abdominal free air or free fluid. Hepatobiliary: Layering small stones noted within the gallbladder. No pericholecystic  fluid or evidence of acute cholecystitis by CT. The liver is unremarkable. No intrahepatic biliary ductal dilatation. Pancreas: Unremarkable. No pancreatic ductal dilatation or surrounding inflammatory changes. Spleen: Normal in size without focal abnormality. Adrenals/Urinary Tract: Adrenal glands are unremarkable. Kidneys are normal, without renal calculi, focal lesion, or hydronephrosis. Bladder is unremarkable. Stomach/Bowel: There is minimal thickened appearance of the distal esophagus which may be related to underdistention. Esophagitis is not excluded. There is no evidence of bowel obstruction or active inflammation. There is sigmoid diverticulosis without active inflammatory changes. Normal appendix. Vascular/Lymphatic: No significant vascular findings are present. No enlarged abdominal or pelvic lymph nodes. Reproductive: The prostate and seminal vesicles are grossly unremarkable. Other: Small fat containing umbilical hernia. Musculoskeletal: There is a total left hip arthroplasty in anatomic alignment. Heterotopic bone noted adjacent to the left femoral trochanteric area. There is linear sclerotic changes of the right femoral head most consistent with avascular necrosis. No acute fracture. IMPRESSION: 1. No acute intra- abdominopelvic pathology. No evidence of bowel obstruction or active inflammation. Normal appendix. 2. Sigmoid diverticulosis without active inflammatory changes. 3. Cholelithiasis. 4. Underdistention versus mild esophagitis. 5. Sclerotic changes of the right femoral head most consistent with avascular necrosis. No fracture or cortical collapse. Electronically Signed   By: Elgie Collard M.D.   On: 03/06/2017 00:00    Procedures Procedures (including critical care time)  Medications Ordered in ED Medications  sodium chloride 0.9 % bolus 1,000 mL (0 mLs Intravenous Stopped 03/06/17 0308)  sodium chloride 0.9 % bolus 1,000 mL (0 mLs Intravenous Stopped 03/06/17 0308)  phenytoin  (DILANTIN) 1,000 mg in sodium chloride 0.9 % 250 mL IVPB (0 mg Intravenous Stopped 03/06/17 0308)  LORazepam (ATIVAN) injection 1 mg (1 mg Intravenous Given 03/05/17 2257)  iopamidol (ISOVUE-300) 61 % injection (100 mLs  Contrast Given 03/05/17 2326)  gi cocktail (Maalox,Lidocaine,Donnatal) (30 mLs Oral Given 03/06/17 0040)  levETIRAcetam (KEPPRA) tablet 500 mg (500 mg Oral Given 03/06/17 0040)  chlordiazePOXIDE (LIBRIUM) capsule 25 mg (25 mg Oral Given 03/06/17 0044)     Initial Impression / Assessment and Plan / ED Course  I have reviewed the triage vital signs and the nursing notes.  Pertinent labs & imaging results that were available during my care of the patient were reviewed by me and considered  in my medical decision making (see chart for details).     41 year old male here with nausea and vomiting. I suspect his nausea and vomiting secondary to viral GI bug, versus mild alcohol withdrawal with GI upset secondary to poor appetite and diet. He is not tachycardic, hypertensive, and is otherwise hemodynamically stable. He has no focal neurological deficits and has had no seizures. His screening labwork is overall unremarkable and CT of his abdomen and pelvis is negative. No evidence of acute intra-abdominal pathology. He is tolerating by mouth in the ED without difficulty. I suspect his symptoms are secondary to poor diet versus alcohol withdrawal. He does have a history of alcohol withdrawal and I discussed this with the patient in detail. At this time, he has no tremors or clinical evidence of objective withdrawal on exam and he would like to return home. I believe this is reasonable. We discussed with his roommates as well. He denies any SI, HI, or auditory or visual hallucinations and does not appear decompensated from a psychiatric standpoint. Will treat him with Librium, which will also help with his nausea, as well as refill 2 weeks worth of his antiepileptics. He has been given an IV Dilantin  as well as Keppra load here. Strict return precautions given. I discussed the risks of drinking well Librium and patient understands.  Final Clinical Impressions(s) / ED Diagnoses   Final diagnoses:  Dehydration  Non-intractable vomiting with nausea, unspecified vomiting type  Subtherapeutic serum dilantin level    New Prescriptions Discharge Medication List as of 03/06/2017 12:46 AM    START taking these medications   Details  chlordiazePOXIDE (LIBRIUM) 25 MG capsule 50mg  PO TID x 1D, then 25-50mg  PO BID X 1D, then 25-50mg  PO QD X 1D, Print    levETIRAcetam (KEPPRA) 500 MG tablet Take 1 tablet (500 mg total) by mouth 2 (two) times daily., Starting Tue 03/06/2017, Until Tue 03/20/2017, Print    thiamine (VITAMIN B-1) 100 MG tablet Take 1 tablet (100 mg total) by mouth daily., Starting Tue 03/06/2017, Until Tue 03/20/2017, Print         Shaune Pollack, MD 03/06/17 [phone]

## 2017-03-05 NOTE — ED Triage Notes (Addendum)
Pt c/o nausea and vomiting x's 5 days.  Pt denies diarrhea.  Pt also st's he is out of his seizure med (dilantin) and his schizophrenia meds.  Pt also c/o abd pain and st's he feels like he is going to have a seizure

## 2017-03-05 NOTE — ED Notes (Signed)
Pt off medicine 6-7 day; started getting nauseated and vomiting for 8-9 days.  Pt has been unable to find a ride to the hospital.  Pt does not know his seizure aura, unsure if he has had a seizure since being off his meds.  Also c/o abd pn, cold symptoms.

## 2017-03-06 LAB — URINALYSIS, ROUTINE W REFLEX MICROSCOPIC
Bilirubin Urine: NEGATIVE
Glucose, UA: NEGATIVE mg/dL
Hgb urine dipstick: NEGATIVE
Ketones, ur: 15 mg/dL — AB
Leukocytes, UA: NEGATIVE
NITRITE: NEGATIVE
Protein, ur: NEGATIVE mg/dL
SPECIFIC GRAVITY, URINE: 1.015 (ref 1.005–1.030)
pH: 7 (ref 5.0–8.0)

## 2017-03-06 LAB — MAGNESIUM: Magnesium: 1.7 mg/dL (ref 1.7–2.4)

## 2017-03-06 MED ORDER — PHENYTOIN SODIUM EXTENDED 200 MG PO CAPS: 200.0000 mg | ORAL_CAPSULE | Freq: Every day | ORAL | 0 refills | Status: AC

## 2017-03-06 MED ORDER — GI COCKTAIL ~~LOC~~
30.0000 mL | Freq: Once | ORAL | Status: AC
  Administered 2017-03-06: 30 mL via ORAL
  Filled 2017-03-06: qty 30

## 2017-03-06 MED ORDER — VITAMIN B-1 100 MG PO TABS: 100.0000 mg | ORAL_TABLET | Freq: Every day | ORAL | 0 refills | Status: AC

## 2017-03-06 MED ORDER — CHLORDIAZEPOXIDE HCL 25 MG PO CAPS: ORAL_CAPSULE | ORAL | 0 refills | Status: DC

## 2017-03-06 MED ORDER — LEVETIRACETAM 500 MG PO TABS: 500.0000 mg | ORAL_TABLET | Freq: Two times a day (BID) | ORAL | 0 refills | Status: DC

## 2017-03-06 MED ORDER — CHLORDIAZEPOXIDE HCL 5 MG PO CAPS
25.0000 mg | ORAL_CAPSULE | Freq: Once | ORAL | Status: AC
  Administered 2017-03-06: 25 mg via ORAL
  Filled 2017-03-06: qty 5

## 2017-03-06 MED ORDER — PHENYTOIN SODIUM EXTENDED 100 MG PO CAPS: 100.0000 mg | ORAL_CAPSULE | Freq: Every day | ORAL | 0 refills | Status: AC

## 2017-03-06 MED ORDER — LEVETIRACETAM 500 MG PO TABS
500.0000 mg | ORAL_TABLET | Freq: Once | ORAL | Status: AC
  Administered 2017-03-06: 500 mg via ORAL
  Filled 2017-03-06: qty 1

## 2017-03-06 NOTE — Discharge Instructions (Signed)
-   DO NOT DRINK while taking Librium - IT IS VERY IMPORTANT TO TAKE YOUR MEDICATIONS AS PRESCIBED - Return to the ER if you have a seizure

## 2017-04-21 ENCOUNTER — Emergency Department (HOSPITAL_COMMUNITY)
Admission: EM | Admit: 2017-04-21 | Discharge: 2017-04-21 | Disposition: A | Discharge status: Home / Self Care | Payer: Medicaid Other | Attending: Emergency Medicine | Admitting: Emergency Medicine

## 2017-04-21 ENCOUNTER — Emergency Department (HOSPITAL_COMMUNITY): Payer: Medicaid Other

## 2017-04-21 ENCOUNTER — Encounter (HOSPITAL_COMMUNITY): Payer: Self-pay

## 2017-04-21 DIAGNOSIS — Z5181 Encounter for therapeutic drug level monitoring: Secondary | ICD-10-CM | POA: Insufficient documentation

## 2017-04-21 DIAGNOSIS — F17210 Nicotine dependence, cigarettes, uncomplicated: Secondary | ICD-10-CM | POA: Insufficient documentation

## 2017-04-21 DIAGNOSIS — R569 Unspecified convulsions: Secondary | ICD-10-CM

## 2017-04-21 DIAGNOSIS — G40909 Epilepsy, unspecified, not intractable, without status epilepticus: Secondary | ICD-10-CM | POA: Insufficient documentation

## 2017-04-21 DIAGNOSIS — Z9114 Patient's other noncompliance with medication regimen: Secondary | ICD-10-CM | POA: Insufficient documentation

## 2017-04-21 DIAGNOSIS — Z96642 Presence of left artificial hip joint: Secondary | ICD-10-CM | POA: Insufficient documentation

## 2017-04-21 LAB — COMPREHENSIVE METABOLIC PANEL
ALK PHOS: 61 U/L (ref 38–126)
ALT: 121 U/L — ABNORMAL HIGH (ref 17–63)
ANION GAP: 19 — AB (ref 5–15)
AST: 150 U/L — ABNORMAL HIGH (ref 15–41)
Albumin: 4.5 g/dL (ref 3.5–5.0)
BILIRUBIN TOTAL: 1.6 mg/dL — AB (ref 0.3–1.2)
BUN: 11 mg/dL (ref 6–20)
CALCIUM: 9.7 mg/dL (ref 8.9–10.3)
CO2: 17 mmol/L — ABNORMAL LOW (ref 22–32)
Chloride: 98 mmol/L — ABNORMAL LOW (ref 101–111)
Creatinine, Ser: 1.65 mg/dL — ABNORMAL HIGH (ref 0.61–1.24)
GFR, EST AFRICAN AMERICAN: 59 mL/min — AB (ref >60)
GFR, EST NON AFRICAN AMERICAN: 51 mL/min — AB (ref >60)
GLUCOSE: 97 mg/dL (ref 65–99)
POTASSIUM: 3.6 mmol/L (ref 3.5–5.1)
Sodium: 134 mmol/L — ABNORMAL LOW (ref 135–145)
TOTAL PROTEIN: 7.9 g/dL (ref 6.5–8.1)

## 2017-04-21 LAB — CK: CK TOTAL: 385 U/L (ref 49–397)

## 2017-04-21 LAB — CBC
HCT: 36.9 % — ABNORMAL LOW (ref 39.0–52.0)
HEMOGLOBIN: 12.4 g/dL — AB (ref 13.0–17.0)
MCH: 31.5 pg (ref 26.0–34.0)
MCHC: 33.6 g/dL (ref 30.0–36.0)
MCV: 93.7 fL (ref 78.0–100.0)
Platelets: 82 10*3/uL — ABNORMAL LOW (ref 150–400)
RBC: 3.94 MIL/uL — AB (ref 4.22–5.81)
RDW: 14.4 % (ref 11.5–15.5)
WBC: 8.3 10*3/uL (ref 4.0–10.5)

## 2017-04-21 LAB — DIFFERENTIAL
Basophils Absolute: 0 10*3/uL (ref 0.0–0.1)
Basophils Relative: 0 %
EOS ABS: 0 10*3/uL (ref 0.0–0.7)
EOS PCT: 0 %
LYMPHS ABS: 1.4 10*3/uL (ref 0.7–4.0)
LYMPHS PCT: 17 %
MONO ABS: 0.7 10*3/uL (ref 0.1–1.0)
MONOS PCT: 9 %
Neutro Abs: 6.1 10*3/uL (ref 1.7–7.7)
Neutrophils Relative %: 74 %

## 2017-04-21 LAB — I-STAT CHEM 8, ED
BUN: 14 mg/dL (ref 6–20)
Calcium, Ion: 1.1 mmol/L — ABNORMAL LOW (ref 1.15–1.40)
Chloride: 99 mmol/L — ABNORMAL LOW (ref 101–111)
Creatinine, Ser: 1.6 mg/dL — ABNORMAL HIGH (ref 0.61–1.24)
Glucose, Bld: 99 mg/dL (ref 65–99)
HEMATOCRIT: 41 % (ref 39.0–52.0)
HEMOGLOBIN: 13.9 g/dL (ref 13.0–17.0)
Potassium: 3.5 mmol/L (ref 3.5–5.1)
SODIUM: 134 mmol/L — AB (ref 135–145)
TCO2: 21 mmol/L (ref 0–100)

## 2017-04-21 LAB — PROTIME-INR
INR: 1.11
Prothrombin Time: 14.4 seconds (ref 11.4–15.2)

## 2017-04-21 LAB — I-STAT TROPONIN, ED: TROPONIN I, POC: 0 ng/mL (ref 0.00–0.08)

## 2017-04-21 LAB — PHENYTOIN LEVEL, TOTAL: Phenytoin Lvl: <2.5 ug/mL — ABNORMAL LOW (ref 10.0–20.0)

## 2017-04-21 LAB — APTT: aPTT: 25 seconds (ref 24–36)

## 2017-04-21 MED ORDER — LEVETIRACETAM 500 MG PO TABS: 500.0000 mg | ORAL_TABLET | Freq: Two times a day (BID) | ORAL | 0 refills | Status: DC

## 2017-04-21 MED ORDER — SODIUM CHLORIDE 0.9 % IV BOLUS (SEPSIS)
1000.0000 mL | Freq: Once | INTRAVENOUS | Status: AC
  Administered 2017-04-21: 1000 mL via INTRAVENOUS

## 2017-04-21 MED ORDER — SODIUM CHLORIDE 0.9 % IV SOLN
1000.0000 mg | Freq: Once | INTRAVENOUS | Status: AC
  Administered 2017-04-21: 1000 mg via INTRAVENOUS
  Filled 2017-04-21: qty 10

## 2017-04-21 MED ORDER — LORAZEPAM 2 MG/ML IJ SOLN
1.0000 mg | Freq: Once | INTRAMUSCULAR | Status: AC
  Administered 2017-04-21: 1 mg via INTRAVENOUS

## 2017-04-21 MED ORDER — LORAZEPAM 2 MG/ML IJ SOLN
INTRAMUSCULAR | Status: AC
  Filled 2017-04-21: qty 1

## 2017-04-21 NOTE — Progress Notes (Signed)
Code stroke called at 1613, Patient arrived to [facility] ED via [facility] at 1620.  As per EMS he was LSN 1545, when at work he became confused and altered.  NIHSS 0, Patient states his jaw locked up and his left arm locked up, now complains of Left hand locking up, 1 mg ativan given.  Code stroke cancelled at 1643

## 2017-04-21 NOTE — ED Triage Notes (Signed)
Pt presents to the ed with ems from work, he was at work today with a friend and began acting altered, he was instructed to move some tools and moved plants instead, on ems arrival he was unresponsive for a brief period and then became confused and combative, on arrival to the ed the patient is alert oriented and has no weakness. Neurologist at bedside and taken straight to ct

## 2017-04-21 NOTE — Consult Note (Signed)
Neurology Consultation Reason for Consult: Altered mental status Referring Physician: Effie Shy, E  CC: Altered mental status  History is obtained from: Patient  HPI: [name] is a 41 y.o. male with a history of schizophrenia, seizure disorder who presents with altered mental status started acutely earlier today. Around 1545, he reports that he felt his jaw and left arm "locking up" and then became acutely confused. He was seen to be doing things that did not make sense at his job site such as moving plants when told to move something else. He was then transported EMS and was unable to tell his name and therefore a code stroke was called.  On arrival, his symptoms cleared and he was back to baseline, however he complained that his left hand felt like it was "locking up again"and therefore he was given Ativan.  He states that he does have seizures without convulsions sometimes, sometimes just becoming confused. He does not remember much of the episode.   Of note, he was on single agent Dilantin until he was admitted to behavioral health in 2016 and had a Dilantin level of 20.6. He reported several seizures in the days prior to this and therefore Keppra was added.  He is currently out of his seizure medications, and even without his seizure medications and seizures are relatively infrequent.  He reports that he last drink alcohol 3 days ago, and prior to that admitted been several weeks.  ROS: A 14 point ROS was performed and is negative except as noted in the HPI.   Past Medical History:  Diagnosis Date  . Arthritis    hands  . Avascular necrosis (HCC)    L HIP  . Avascular necrosis of hip (HCC)    left  . Schizophrenia (HCC)   . Seizures (HCC)    HX [place] seiaures - last seizure 6-7 months ago  . Substance abuse    has not used for 5-6 yrs     Family History  Problem Relation Age of Onset  . Cancer Brother   . Diabetes Mother   . Hypertension Mother   . Cancer Father    . Hypertension Father   . Hypertension Sister   . Diabetes Maternal Grandmother      Social History:  reports that he has been smoking Cigarettes.  He has a 7.50 pack-year smoking history. He has never used smokeless tobacco. He reports that he drinks about 3.6 oz of alcohol per week . He reports that he does not use drugs.   Exam: Current vital signs: BP 113/81 (BP Location: Right Arm)   Pulse 99   Temp 98 F (36.7 C)   Resp 18   Ht  (1.6 m)   Wt 62.7 kg (138 lb 3.7 oz)   SpO2 100%   BMI 24.49 kg/m  Vital signs in last 24 hours: Temp:  [98 F (36.7 C)] 98 F (36.7 C) (04/28 1647) Pulse Rate:  [99] 99 (04/28 1647) Resp:  [18] 18 (04/28 1647) BP: (113)/(81) 113/81 (04/28 1647) SpO2:  [100 %] 100 % (04/28 1647) Weight:  [62.7 kg (138 lb 3.7 oz)] 62.7 kg (138 lb 3.7 oz) (04/28 1637)   Physical Exam  Constitutional: Appears well-developed and well-nourished.  Psych: Affect appropriate to situation Eyes: No scleral injection HENT: No OP obstrucion Head: Normocephalic.  Cardiovascular: Normal rate and regular rhythm.  Respiratory: Effort normal and breath sounds normal to anterior ascultation GI: Soft.  No distension. There is no tenderness.  Skin: WDI  Neuro: Mental Status: Patient is awake, alert, oriented to person, place, month, year, and situation. Patient is able to give a clear and coherent history. No signs of aphasia or neglect He is slow to respond, but does answer correctly. Cranial Nerves: II: Visual Fields are full. Pupils are equal, round, and reactive to light.   III,IV, VI: EOMI without ptosis or diploplia.  V: Facial sensation is symmetric to temperature VII: Facial movement is symmetric.  VIII: hearing is intact to voice X: Uvula elevates symmetrically XI: Shoulder shrug is symmetric. XII: tongue is midline without atrophy or fasciculations.  Motor: Tone is normal. Bulk is normal. 5/5 strength was present in all four extremities.   Sensory: Sensation is symmetric to light touch and temperature in the arms and legs. Cerebellar: FNF and HKS are intact bilaterally   I have reviewed labs in epic and the results pertinent to this consultation are: CMP mildly elevated AST and ALT Borderline creatinine  I have reviewed the images obtained:   Impression: 41 year old male with a history of seizures with transient episode of altered mental status which is highly suspicious for partial seizure.  I'm not certain what his Dilantin level was when he actually had those seizures in 2016 and he may be more compliant if he was only to take single agent rather than 2 agents. For this reason, I would favor starting single agent Keppra, rather than continuing his trend of noncompliance with two medicines.  With his history of bipolar disorder(or schizoaffective) Depakote or Lamictal could be considered but I would not favor starting these unless he can be monitored by an outpatient physician.  Recommendations: 1) Keppra 500 mg twice a day 2) no further recommendations at this time, please call with further questions or concerns.   Ritta Slot, MD [facility] [phone]  If 7pm- 7am, please page neurology on call as listed in [facility].

## 2017-04-21 NOTE — ED Provider Notes (Signed)
[facility]EMER[facility]ENCY DEPT Provider Note   CSN: [id number] Arrival date & time: 04/21/17  1620   An emergency department physician performed an initial assessment on this suspected stroke patient at 1620.  History   Chief Complaint Chief Complaint  Patient presents with  . Code Stroke    HPI [name] is a 41 y.o. male.  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  He was transferred by EMS, and apparently improved during transport.  Upon arrival he presented as code stroke and was seen immediately by neurology.  It was quickly determined that he had likely had a seizure, and not an acute vascular event.  When I talked to the patient he was calm and comfortable.  He could not remember the events of today after arriving at work.  He does recall that he is almost out of medications and has been holding onto a few Dilantin pills, but not taking them.  He denies recent fever, chills, chest pain, nausea, vomiting, weakness or dizziness.  There are no other known modifying factors.  HPI  Past Medical History:  Diagnosis Date  . Arthritis    hands  . Avascular necrosis (HCC)    L HIP  . Avascular necrosis of hip (HCC)    left  . Schizophrenia (HCC)   . Seizures (HCC)    HX [place] seiaures - last seizure 6-7 months ago  . Substance abuse    has not used for 5-6 yrs    Patient Active Problem List   Diagnosis Date Noted  . Alcohol withdrawal (HCC) 08/25/2016  . H/O noncompliance with medical treatment, presenting hazards to health 08/25/2016  . Seizure (HCC) 08/25/2016  . Paranoid schizophrenia (HCC) 08/11/2015  . Seizure disorder (HCC) 08/10/2015  . Alcohol use disorder, severe, dependence (HCC) 08/10/2015  . Hx of schizophrenia   . Expected blood loss anemia 04/30/2013  . Overweight (BMI 25.0-29.9) 04/30/2013  . S/P left THA, AA 04/29/2013    Past Surgical History:  Procedure Laterality Date  . NO PAST SURGERIES    . TOTAL HIP ARTHROPLASTY  Left 04/29/2013   Procedure: TOTAL HIP ARTHROPLASTY ANTERIOR APPROACH;  Surgeon: Shelda Pal, MD;  Location: [facility];  Service: Orthopedics;  Laterality: Left;       Home Medications    Prior to Admission medications   Medication Sig Start Date End Date Taking? Authorizing Provider  benztropine (COGENTIN) 1 MG tablet Take 1 tablet (1 mg total) by mouth 2 (two) times daily. Patient not taking: Reported on 12/26/2016 08/29/16   Dorothea Ogle, MD  chlordiazePOXIDE (LIBRIUM) 25 MG capsule  PO TID x 1D, then 25-50mg  PO BID X 1D, then 25-50mg  PO QD X 1D 03/06/17   Shaune Pollack, MD  EPINEPHrine 0.3 mg/0.3 mL IJ SOAJ injection Inject 0.3 mLs (0.3 mg total) into the muscle as needed. 08/16/15   Thermon Leyland, NP  haloperidol (HALDOL) 10 MG tablet Take one tablet (10 mg) at bedtime. Patient not taking: Reported on 12/26/2016 08/16/15   Thermon Leyland, NP  haloperidol (HALDOL) 5 MG tablet Take 1 tablet (5 mg total) by mouth daily with breakfast. Patient not taking: Reported on 12/26/2016 08/16/15   Thermon Leyland, NP  haloperidol decanoate (HALDOL DECANOATE) 100 MG/ML injection Inject 0.5 mLs (50 mg total) into the muscle every 21 ( twenty-one) days. Patient not taking: Reported on 12/26/2016 09/06/15   Thermon Leyland, NP  levETIRAcetam (KEPPRA) 500 MG tablet Take 1  tablet (500 mg total) by mouth 2 (two) times daily. 03/06/17 03/20/17  Shaune Pollack, MD  levETIRAcetam (KEPPRA) 500 MG tablet Take 1 tablet (500 mg total) by mouth 2 (two) times daily. 04/21/17   Mancel Bale, MD    Family History Family History  Problem Relation Age of Onset  . Cancer Brother   . Diabetes Mother   . Hypertension Mother   . Cancer Father   . Hypertension Father   . Hypertension Sister   . Diabetes Maternal Grandmother     Social History Social History  Substance Use Topics  . Smoking status: Current Every Day Smoker    Packs/day: 1.50    Years: 5.00    Types: Cigarettes  . Smokeless tobacco: Never Used  . Alcohol  use 3.6 oz/week    6 Cans of beer per week     Comment: Pt. states that he drinks beer every couple of days 2 (12 oz cans)     Allergies   Bee venom   Review of Systems Review of Systems  All other systems reviewed and are negative.    Physical Exam Updated Vital Signs BP (!) 136/95   Pulse 93   Temp 98 F (36.7 C)   Resp 19   Ht  (1.6 m)   Wt 138 lb 3.7 oz (62.7 kg)   SpO2 100%   BMI 24.49 kg/m   Physical Exam  Constitutional: He is oriented to person, place, and time. He appears well-developed and well-nourished. No distress.  HENT:  Head: Normocephalic and atraumatic.  Right Ear: External ear normal.  Left Ear: External ear normal.  Eyes: Conjunctivae and EOM are normal. Pupils are equal, round, and reactive to light.  Neck: Normal range of motion and phonation normal. Neck supple.  Cardiovascular: Normal rate, regular rhythm and normal heart sounds.   Pulmonary/Chest: Effort normal and breath sounds normal. He exhibits no bony tenderness.  Abdominal: Soft. There is no tenderness.  Musculoskeletal: Normal range of motion.  Neurological: He is alert and oriented to person, place, and time. No cranial nerve deficit or sensory deficit. He exhibits normal muscle tone. Coordination normal.  Skin: Skin is warm, dry and intact.  Psychiatric: He has a normal mood and affect. His behavior is normal. Judgment and thought content normal.  Nursing note and vitals reviewed.    ED Treatments / Results  Labs (all labs ordered are listed, but only abnormal results are displayed) Labs Reviewed  CBC - Abnormal; Notable for the following:       Result Value   RBC 3.94 (*)    Hemoglobin 12.4 (*)    HCT 36.9 (*)    Platelets 82 (*)    All other components within normal limits  COMPREHENSIVE METABOLIC PANEL - Abnormal; Notable for the following:    Sodium 134 (*)    Chloride 98 (*)    CO2 17 (*)    Creatinine, Ser 1.65 (*)    AST 150 (*)    ALT 121 (*)    Total  Bilirubin 1.6 (*)    GFR calc non Af Amer 51 (*)    GFR calc Af Amer 59 (*)    Anion gap 19 (*)    All other components within normal limits  PHENYTOIN LEVEL, TOTAL - Abnormal; Notable for the following:    Phenytoin Lvl <2.5 (*)    All other components within normal limits  I-STAT CHEM 8, ED - Abnormal; Notable for the following:  Sodium 134 (*)    Chloride 99 (*)    Creatinine, Ser 1.60 (*)    Calcium, Ion 1.10 (*)    All other components within normal limits  PROTIME-INR  APTT  DIFFERENTIAL  CK  I-STAT TROPOININ, ED  CBG MONITORING, ED    EKG  EKG Interpretation  Date/Time:  Saturday April 21 2017 16:46:35 EDT Ventricular Rate:  102 PR Interval:    QRS Duration: 81 QT Interval:  329 QTC Calculation: 429 R Axis:   76 Text Interpretation:  Sinus tachycardia Ventricular premature complex Baseline wander Since last tracing rate faster Confirmed by Effie Shy  MD, Yaslene Lindamood [phone]) on 04/21/2017 4:50:55 PM       Radiology Ct Head Code Stroke W/o Cm  Result Date: 04/21/2017 CLINICAL DATA:  Code stroke. Last seen normal 1530 hours. Aphasia. Altered mental status. EXAM: CT HEAD WITHOUT CONTRAST TECHNIQUE: Contiguous axial images were obtained from the base of the skull through the vertex without intravenous contrast. COMPARISON:  08/25/2016 FINDINGS: Brain: Mild generalized atrophy. No evidence of old or acute focal infarction, mass lesion, hemorrhage, hydrocephalus or extra-axial collection. Vascular: No abnormal vascular finding. Skull: Normal Sinuses/Orbits: Mild mucosal inflammatory changes of the paranasal sinuses. Orbits negative. Other: None ASPECTS (Alberta Stroke Program Early CT Score) - Ganglionic level infarction (caudate, lentiform nuclei, internal capsule, insula, M1-M3 cortex): 7 - Supraganglionic infarction (M4-M6 cortex): 3 Total score (0-10 with 10 being normal): 10 IMPRESSION: 1. Negative head CT.  Mild atrophy. 2. ASPECTS is 10 These results were called by telephone  at the time of interpretation on 04/21/2017 at 4:43 pm to Dr. Amada Jupiter, who verbally acknowledged these results. Electronically Signed   By: Paulina Fusi M.D.   On: 04/21/2017 16:45    Procedures Procedures (including critical care time)  Medications Ordered in ED Medications  LORazepam (ATIVAN) injection 1 mg (1 mg Intravenous Given 04/21/17 1646)  levETIRAcetam (KEPPRA) 1,000 mg in sodium chloride 0.9 % 100 mL IVPB (0 mg Intravenous Stopped 04/21/17 1739)  sodium chloride 0.9 % bolus 1,000 mL (0 mLs Intravenous Stopped 04/21/17 1910)  sodium chloride 0.9 % bolus 1,000 mL (1,000 mLs Intravenous New Bag/Given 04/21/17 1940)     Initial Impression / Assessment and Plan / ED Course  I have reviewed the triage vital signs and the nursing notes.  Pertinent labs & imaging results that were available during my care of the patient were reviewed by me and considered in my medical decision making (see chart for details).     Medications  LORazepam (ATIVAN) injection 1 mg (1 mg Intravenous Given 04/21/17 1646)  levETIRAcetam (KEPPRA) 1,000 mg in sodium chloride 0.9 % 100 mL IVPB (0 mg Intravenous Stopped 04/21/17 1739)  sodium chloride 0.9 % bolus 1,000 mL (0 mLs Intravenous Stopped 04/21/17 1910)  sodium chloride 0.9 % bolus 1,000 mL (1,000 mLs Intravenous New Bag/Given 04/21/17 1940)    Patient Vitals for the past 24 hrs:  BP Temp Pulse Resp SpO2 Height Weight  04/21/17 1800 (!) 136/95 - 93 19 100 % - -  04/21/17 1745 125/90 - 94 18 100 % - -  04/21/17 1730 (!) 131/95 - 96 18 99 % - -  04/21/17 1715 123/89 - 98 (!) 21 99 % - -  04/21/17 1700 130/90 - (!) 125 (!) 25 100 % - -  04/21/17 1647 113/81 98 F (36.7 C) 99 18 100 % - -  04/21/17 1645 113/81 - (!) 101 19 98 % - -  04/21/17 1637 - - - - -   (1.6 m) 138 lb 3.7 oz (62.7 kg)    9:17 PM Reevaluation with update and discussion. After initial assessment and treatment, an updated evaluation reveals no seizure in the emergency  department.  Findings discussed with the patient and all questions answered. Tala Eber L    Final Clinical Impressions(s) / ED Diagnoses   Final diagnoses:  Seizure (HCC)   Apparent seizure, related to noncompliance with medication.  She was seen by neurology who elected to take him off Dilantin, and maintain him on Keppra 500 mg twice daily, since that seemed to work in the past, to prevent seizures.  Nursing Notes Reviewed/ Care Coordinated Applicable Imaging Reviewed Interpretation of Laboratory Data incorporated into ED treatment  The patient appears reasonably screened and/or stabilized for discharge and I doubt any other medical condition or other [facility] requiring further screening, evaluation, or treatment in the ED at this time prior to discharge.  Plan: Home Medications-discontinue Dilantin, start Keppra 500 twice daily tomorrow; Home Treatments-get plenty of rest, seizure precautions; return here if the recommended treatment, does not improve the symptoms; Recommended follow up-neurology and PCP, as soon as possible   New Prescriptions New Prescriptions   LEVETIRACETAM (KEPPRA) 500 MG TABLET    Take 1 tablet (500 mg total) by mouth 2 (two) times daily.     Mancel Bale, MD 04/21/17 2126

## 2017-04-21 NOTE — Discharge Instructions (Signed)
Start taking the medications, to prevent seizures, tomorrow.  Make sure that you are getting plenty of rest, drinking a lot of fluids, and avoid all forms of alcohol.  Do not drive a vehicle, climb a ladder, or bathe in a bathtub, until you follow-up with a neurologist or your primary care doctor, for further evaluation and treatment.

## 2017-04-26 ENCOUNTER — Encounter (HOSPITAL_COMMUNITY): Payer: Self-pay

## 2017-04-26 ENCOUNTER — Emergency Department (HOSPITAL_COMMUNITY)
Admission: EM | Admit: 2017-04-26 | Discharge: 2017-04-26 | Disposition: A | Discharge status: Home / Self Care | Payer: Medicaid Other | Attending: Emergency Medicine | Admitting: Emergency Medicine

## 2017-04-26 DIAGNOSIS — Z96642 Presence of left artificial hip joint: Secondary | ICD-10-CM | POA: Diagnosis not present

## 2017-04-26 DIAGNOSIS — Z760 Encounter for issue of repeat prescription: Secondary | ICD-10-CM | POA: Insufficient documentation

## 2017-04-26 DIAGNOSIS — F17210 Nicotine dependence, cigarettes, uncomplicated: Secondary | ICD-10-CM | POA: Diagnosis not present

## 2017-04-26 MED ORDER — LEVETIRACETAM 500 MG PO TABS: 500.0000 mg | ORAL_TABLET | Freq: Two times a day (BID) | ORAL | 0 refills | Status: DC

## 2017-04-26 NOTE — Discharge Instructions (Signed)
Please read attached information. If you experience any new or worsening signs or symptoms please return to the emergency room for evaluation. Please follow-up with your primary care provider or specialist as discussed. Please use medication prescribed only as directed and discontinue taking if you have any concerning signs or symptoms.   °

## 2017-04-26 NOTE — ED Triage Notes (Signed)
Pt states he was seen here Monday for seizure. He was d/c with a script for seizure meds and cannot find the paper. Pt sates he has not had any seizures just needs med refill. Pt alert and oriented.

## 2017-04-26 NOTE — ED Provider Notes (Signed)
[facility]PT Provider Note   CSN: [id number] Arrival date & time: 04/26/17  1437  By signing my name below, I, Rosana Fretana Waskiewicz, attest that this documentation has been prepared under the direction and in the presence of Newell RubbermaidJeffrey Nikki Glanzer, PA-C.  Electronically Signed: Rosana Fretana Waskiewicz, ED Scribe. 04/26/17. 3:03 PM.   History   Chief Complaint Chief Complaint  Patient presents with  . Medication Refill   The history is provided by the patient. No language interpreter was used.    HPI Comments: [name] is a 41 y.o. male who presents to the Emergency Department requesting a refill of his prescribed Keppra. Per pt, he recently received a prescription to refill this medication; however, he recently misplaced the prescription and is now requesting a refill. He has not recently had any episodes of seizure-like activity. He has otherwise been feeling at his baseline. There are no other associated symptoms or complaints at this time.   Past Medical History:  Diagnosis Date  . Arthritis    hands  . Avascular necrosis (HCC)    L HIP  . Avascular necrosis of hip (HCC)    left  . Schizophrenia (HCC)   . Seizures (HCC)    HX [place] seiaures - last seizure 6-7 months ago  . Substance abuse    has not used for 5-6 yrs    Patient Active Problem List   Diagnosis Date Noted  . Alcohol withdrawal (HCC) 08/25/2016  . H/O noncompliance with medical treatment, presenting hazards to health 08/25/2016  . Seizure (HCC) 08/25/2016  . Paranoid schizophrenia (HCC) 08/11/2015  . Seizure disorder (HCC) 08/10/2015  . Alcohol use disorder, severe, dependence (HCC) 08/10/2015  . Hx of schizophrenia   . Expected blood loss anemia 04/30/2013  . Overweight (BMI 25.0-29.9) 04/30/2013  . S/P left THA, AA 04/29/2013    Past Surgical History:  Procedure Laterality Date  . NO PAST SURGERIES    . TOTAL HIP ARTHROPLASTY Left 04/29/2013   Procedure: TOTAL HIP ARTHROPLASTY ANTERIOR APPROACH;   Surgeon: Shelda PalMatthew D Olin, MD;  Location: [facility];  Service: Orthopedics;  Laterality: Left;       Home Medications    Prior to Admission medications   Medication Sig Start Date End Date Taking? Authorizing Provider  benztropine (COGENTIN) 1 MG tablet Take 1 tablet (1 mg total) by mouth 2 (two) times daily. Patient not taking: Reported on 12/26/2016 08/29/16   Dorothea OgleIskra M Myers, MD  chlordiazePOXIDE (LIBRIUM) 25 MG capsule 50mg  PO TID x 1D, then 25-50mg  PO BID X 1D, then 25-50mg  PO QD X 1D 03/06/17   Shaune Pollackameron Isaacs, MD  EPINEPHrine 0.3 mg/0.3 mL IJ SOAJ injection Inject 0.3 mLs (0.3 mg total) into the muscle as needed. 08/16/15   Thermon LeylandLaura A Davis, NP  haloperidol (HALDOL) 10 MG tablet Take one tablet (10 mg) at bedtime. Patient not taking: Reported on 12/26/2016 08/16/15   Thermon LeylandLaura A Davis, NP  haloperidol (HALDOL) 5 MG tablet Take 1 tablet (5 mg total) by mouth daily with breakfast. Patient not taking: Reported on 12/26/2016 08/16/15   Thermon LeylandLaura A Davis, NP  haloperidol decanoate (HALDOL DECANOATE) 100 MG/ML injection Inject 0.5 mLs (50 mg total) into the muscle every 21 ( twenty-one) days. Patient not taking: Reported on 12/26/2016 09/06/15   Thermon LeylandLaura A Davis, NP  levETIRAcetam (KEPPRA) 500 MG tablet Take 1 tablet (500 mg total) by mouth 2 (two) times daily. 03/06/17 03/20/17  Shaune Pollackameron Isaacs, MD  levETIRAcetam (KEPPRA) 500 MG tablet Take 1 tablet (500 mg  total) by mouth 2 (two) times daily. 04/26/17   Eyvonne Mechanic, PA-C    Family History Family History  Problem Relation Age of Onset  . Cancer Brother   . Diabetes Mother   . Hypertension Mother   . Cancer Father   . Hypertension Father   . Hypertension Sister   . Diabetes Maternal Grandmother     Social History Social History  Substance Use Topics  . Smoking status: Current Every Day Smoker    Packs/day: 1.50    Years: 5.00    Types: Cigarettes  . Smokeless tobacco: Never Used  . Alcohol use 3.6 oz/week    6 Cans of beer per week     Comment: Pt. states  that he drinks beer every couple of days 2 (12 oz cans)     Allergies   Bee venom   Review of Systems Review of Systems  Constitutional: Negative for fever.  Respiratory: Negative for shortness of breath.   Cardiovascular: Negative for chest pain.  Neurological: Negative for seizures.  All other systems reviewed and are negative.  Physical Exam Updated Vital Signs BP (!) 128/96 (BP Location: Right Arm)   Pulse 93   Temp 99.2 F (37.3 C) (Oral)   Ht 5\' 4"  (1.626 m)   Wt 63.5 kg   SpO2 100%   BMI 24.03 kg/m   Physical Exam  Constitutional: He is oriented to person, place, and time. He appears well-developed and well-nourished.  HENT:  Head: Normocephalic and atraumatic.  Cardiovascular: Normal rate.   Pulmonary/Chest: Effort normal.  Neurological: He is alert and oriented to person, place, and time.  Skin: Skin is warm and dry.  Psychiatric: He has a normal mood and affect.  Nursing note and vitals reviewed.    ED Treatments / Results   DIAGNOSTIC STUDIES: Oxygen Saturation is 100% on RA, normal by my interpretation.   COORDINATION OF CARE: 3:03 PM-Discussed next steps with pt. Pt verbalized understanding and is agreeable with the plan.    Labs (all labs ordered are listed, but only abnormal results are displayed) Labs Reviewed - No data to display  EKG  EKG Interpretation None       Radiology No results found.  Procedures Procedures (including critical care time)  Medications Ordered in ED Medications - No data to display   Initial Impression / Assessment and Plan / ED Course  I have reviewed the triage vital signs and the nursing notes.  Pertinent labs & imaging results that were available during my care of the patient were reviewed by me and considered in my medical decision making (see chart for details).      Final Clinical Impressions(s) / ED Diagnoses   Final diagnoses:  Medication refill    41-year-old male presents today for  medication refill.  He has no complaints.  No recent seizures.  Patient will be given new prescription for lost prescription.  Return precautions given.  New Prescriptions Discharge Medication List as of 04/26/2017  2:58 PM    I personally performed the services described in this documentation, which was scribed in my presence. The recorded information has been reviewed and is accurate.   Eyvonne Mechanic, PA-C 04/26/17 1621    Vanetta Mulders, MD 04/26/17 2100

## 2017-05-14 ENCOUNTER — Encounter (HOSPITAL_COMMUNITY): Payer: Self-pay | Admitting: Emergency Medicine

## 2017-05-14 ENCOUNTER — Emergency Department (HOSPITAL_COMMUNITY)
Admission: EM | Admit: 2017-05-14 | Discharge: 2017-05-14 | Disposition: A | Discharge status: Home / Self Care | Payer: Medicaid Other | Attending: Emergency Medicine | Admitting: Emergency Medicine

## 2017-05-14 ENCOUNTER — Emergency Department (HOSPITAL_COMMUNITY): Payer: Medicaid Other

## 2017-05-14 DIAGNOSIS — W1839XA Other fall on same level, initial encounter: Secondary | ICD-10-CM | POA: Diagnosis not present

## 2017-05-14 DIAGNOSIS — S4991XA Unspecified injury of right shoulder and upper arm, initial encounter: Secondary | ICD-10-CM | POA: Diagnosis present

## 2017-05-14 DIAGNOSIS — Z79899 Other long term (current) drug therapy: Secondary | ICD-10-CM | POA: Insufficient documentation

## 2017-05-14 DIAGNOSIS — Y9289 Other specified places as the place of occurrence of the external cause: Secondary | ICD-10-CM | POA: Diagnosis not present

## 2017-05-14 DIAGNOSIS — Y999 Unspecified external cause status: Secondary | ICD-10-CM | POA: Diagnosis not present

## 2017-05-14 DIAGNOSIS — M25511 Pain in right shoulder: Secondary | ICD-10-CM

## 2017-05-14 DIAGNOSIS — Y939 Activity, unspecified: Secondary | ICD-10-CM | POA: Diagnosis not present

## 2017-05-14 DIAGNOSIS — F17210 Nicotine dependence, cigarettes, uncomplicated: Secondary | ICD-10-CM | POA: Insufficient documentation

## 2017-05-14 MED ORDER — IBUPROFEN 600 MG PO TABS: 600.0000 mg | ORAL_TABLET | Freq: Four times a day (QID) | ORAL | 0 refills | Status: DC | PRN

## 2017-05-14 NOTE — ED Triage Notes (Signed)
Pt sts fell off porch on Friday and having right shoulder pain

## 2017-05-14 NOTE — ED Provider Notes (Signed)
[facility]EMER[facility]ENCY DEPT Provider Note   CSN: [id number] Arrival date & time: 05/14/17  1422  By signing my name below, Alexia Julien GirtPerkins, attest that this documentation has been prepared under the direction and in the presence of Benjiman CorePickering, Evangelene Vora, MD.  Electronically Signed: Sandrea HammondAlexia Perkins, Scribe 05/14/2017. 4:54 PM.  History   Chief Complaint Chief Complaint  Patient presents with  . Shoulder Pain     The history is provided by the patient. No language interpreter was used.    HPI Comments:  [name] is a 41 y.o. male who presents to the Emergency Department complaining of gradually worsened right shoulder pain s/p fall that occurred 4 days ago. He reports falling off of a porch that had no hand rail, landing on his right shoulder. He denies head injury or LOC. He denies any other injuries or pain at this time. He denies weakness, numbness, or neck pain.   Past Medical History:  Diagnosis Date  . Arthritis    hands  . Avascular necrosis (HCC)    L HIP  . Avascular necrosis of hip (HCC)    left  . Schizophrenia (HCC)   . Seizures (HCC)    HX [place] seiaures - last seizure 6-7 months ago  . Substance abuse    has not used for 5-6 yrs    Patient Active Problem List   Diagnosis Date Noted  . Alcohol withdrawal (HCC) 08/25/2016  . H/O noncompliance with medical treatment, presenting hazards to health 08/25/2016  . Seizure (HCC) 08/25/2016  . Paranoid schizophrenia (HCC) 08/11/2015  . Seizure disorder (HCC) 08/10/2015  . Alcohol use disorder, severe, dependence (HCC) 08/10/2015  . Hx of schizophrenia   . Expected blood loss anemia 04/30/2013  . Overweight (BMI 25.0-29.9) 04/30/2013  . S/P left THA, AA 04/29/2013    Past Surgical History:  Procedure Laterality Date  . NO PAST SURGERIES    . TOTAL HIP ARTHROPLASTY Left 04/29/2013   Procedure: TOTAL HIP ARTHROPLASTY ANTERIOR APPROACH;  Surgeon: Shelda PalMatthew D Olin, MD;  Location: [facility];  Service: Orthopedics;  Laterality:  Left;       Home Medications    Prior to Admission medications   Medication Sig Start Date End Date Taking? Authorizing Provider  benztropine (COGENTIN) 1 MG tablet Take 1 tablet (1 mg total) by mouth 2 (two) times daily. Patient not taking: Reported on 12/26/2016 08/29/16   Dorothea OgleMyers, Iskra M, MD  chlordiazePOXIDE (LIBRIUM) 25 MG capsule 50mg  PO TID x 1D, then 25-50mg  PO BID X 1D, then 25-50mg  PO QD X 1D 03/06/17   Shaune PollackIsaacs, Cameron, MD  EPINEPHrine 0.3 mg/0.3 mL IJ SOAJ injection Inject 0.3 mLs (0.3 mg total) into the muscle as needed. 08/16/15   Thermon Leylandavis, Laura A, NP  haloperidol (HALDOL) 10 MG tablet Take one tablet (10 mg) at bedtime. Patient not taking: Reported on 12/26/2016 08/16/15   Thermon Leylandavis, Laura A, NP  haloperidol (HALDOL) 5 MG tablet Take 1 tablet (5 mg total) by mouth daily with breakfast. Patient not taking: Reported on 12/26/2016 08/16/15   Thermon Leylandavis, Laura A, NP  haloperidol decanoate (HALDOL DECANOATE) 100 MG/ML injection Inject 0.5 mLs (50 mg total) into the muscle every 21 ( twenty-one) days. Patient not taking: Reported on 12/26/2016 09/06/15   Thermon Leylandavis, Laura A, NP  ibuprofen (ADVIL,MOTRIN) 600 MG tablet Take 1 tablet (600 mg total) by mouth every 6 (six) hours as needed. 05/14/17   Benjiman CorePickering, Martrell Eguia, MD  levETIRAcetam (KEPPRA) 500 MG tablet Take 1 tablet (500 mg total) by mouth  2 (two) times daily. 03/06/17 03/20/17  Shaune Pollack, MD  levETIRAcetam (KEPPRA) 500 MG tablet Take 1 tablet (500 mg total) by mouth 2 (two) times daily. 04/26/17   Eyvonne Mechanic, PA-C    Family History Family History  Problem Relation Age of Onset  . Cancer Brother   . Diabetes Mother   . Hypertension Mother   . Cancer Father   . Hypertension Father   . Hypertension Sister   . Diabetes Maternal Grandmother     Social History Social History  Substance Use Topics  . Smoking status: Current Every Day Smoker    Packs/day: 1.50    Years: 5.00    Types: Cigarettes  . Smokeless tobacco: Never Used  . Alcohol use  3.6 oz/week    6 Cans of beer per week     Comment: Pt. states that he drinks beer every couple of days 2 (12 oz cans)     Allergies   Bee venom   Review of Systems Review of Systems  Musculoskeletal: Positive for arthralgias.  Neurological: Negative for syncope, weakness and numbness.     Physical Exam Updated Vital Signs BP (!) 112/99 (BP Location: Left Arm)   Pulse 99   Temp 98.4 F (36.9 C) (Oral)   Resp 18   SpO2 98%   Physical Exam  Constitutional: He appears well-developed and well-nourished. No distress.  HENT:  Head: Normocephalic and atraumatic.  Eyes: Conjunctivae are normal.  Neck: Normal range of motion. Neck supple. No tracheal deviation present.  No midline C spine tenderness. Good ROM of the neck. No other extremity tenderness.    Cardiovascular: Normal rate.   Pulmonary/Chest: Effort normal. He exhibits no tenderness.  Abdominal: Soft.  Musculoskeletal: Normal range of motion.  Tenderness over right shoulder. No deformity but there is swelling. No tenderness over elbow or wrist. NVI to hand. Strong radial pulse.   Neurological: He is alert.  Skin: Skin is warm and dry.  Psychiatric: He has a normal mood and affect.  Nursing note and vitals reviewed.    ED Treatments / Results  COORDINATION OF CARE:  4:54 PM Discussed treatment plan with pt at bedside and pt agreed to plan.  Labs (all labs ordered are listed, but only abnormal results are displayed) Labs Reviewed - No data to display  EKG  EKG Interpretation None       Radiology Dg Shoulder Right  Result Date: 05/14/2017 CLINICAL DATA:  Pain and swelling of the right shoulder since a fall 4 days ago. EXAM: RIGHT SHOULDER - 2+ VIEW COMPARISON:  None. FINDINGS: There is no evidence of fracture or dislocation. There is no evidence of arthropathy or other focal bone abnormality. Soft tissues are unremarkable. IMPRESSION: Negative. Electronically Signed   By: Francene Boyers M.D.   On:  05/14/2017 17:28    Procedures Procedures (including critical care time)  Medications Ordered in ED Medications - No data to display   Initial Impression / Assessment and Plan / ED Course  I have reviewed the triage vital signs and the nursing notes.  Pertinent labs & imaging results that were available during my care of the patient were reviewed by me and considered in my medical decision making (see chart for details).     Patient with right shoulder pain after fall off porch. X-ray reassuring. Sling given for comfort. Range of motion exercises given. Will have follow-up with orthopedic surgeries needed.  Final Clinical Impressions(s) / ED Diagnoses   Final diagnoses:  Acute  pain of right shoulder    New Prescriptions New Prescriptions   IBUPROFEN (ADVIL,MOTRIN) 600 MG TABLET    Take 1 tablet (600 mg total) by mouth every 6 (six) hours as needed.   I personally performed the services described in this documentation, which was scribed in my presence. The recorded information has been reviewed and is accurate.       Benjiman Core, MD 05/14/17 1755

## 2017-08-20 ENCOUNTER — Encounter (HOSPITAL_COMMUNITY): Payer: Self-pay | Admitting: Emergency Medicine

## 2017-08-20 ENCOUNTER — Emergency Department (HOSPITAL_COMMUNITY)
Admission: EM | Admit: 2017-08-20 | Discharge: 2017-08-21 | Disposition: A | Discharge status: Home / Self Care | Payer: Medicaid Other | Attending: Emergency Medicine | Admitting: Emergency Medicine

## 2017-08-20 ENCOUNTER — Emergency Department (HOSPITAL_COMMUNITY): Payer: Medicaid Other

## 2017-08-20 DIAGNOSIS — F17210 Nicotine dependence, cigarettes, uncomplicated: Secondary | ICD-10-CM | POA: Diagnosis not present

## 2017-08-20 DIAGNOSIS — Z96642 Presence of left artificial hip joint: Secondary | ICD-10-CM | POA: Insufficient documentation

## 2017-08-20 DIAGNOSIS — R197 Diarrhea, unspecified: Secondary | ICD-10-CM | POA: Insufficient documentation

## 2017-08-20 DIAGNOSIS — R1110 Vomiting, unspecified: Secondary | ICD-10-CM | POA: Insufficient documentation

## 2017-08-20 DIAGNOSIS — G40909 Epilepsy, unspecified, not intractable, without status epilepticus: Secondary | ICD-10-CM | POA: Diagnosis not present

## 2017-08-20 DIAGNOSIS — R1010 Upper abdominal pain, unspecified: Secondary | ICD-10-CM | POA: Diagnosis present

## 2017-08-20 DIAGNOSIS — F1010 Alcohol abuse, uncomplicated: Secondary | ICD-10-CM | POA: Insufficient documentation

## 2017-08-20 DIAGNOSIS — R1013 Epigastric pain: Secondary | ICD-10-CM

## 2017-08-20 LAB — URINALYSIS, ROUTINE W REFLEX MICROSCOPIC
Bacteria, UA: NONE SEEN
GLUCOSE, UA: NEGATIVE mg/dL
Hgb urine dipstick: NEGATIVE
KETONES UR: 20 mg/dL — AB
Nitrite: NEGATIVE
PH: 9 — AB (ref 5.0–8.0)
Protein, ur: >=300 mg/dL — AB
SPECIFIC GRAVITY, URINE: 1.024 (ref 1.005–1.030)

## 2017-08-20 LAB — COMPREHENSIVE METABOLIC PANEL
ALK PHOS: 45 U/L (ref 38–126)
ALT: 161 U/L — AB (ref 17–63)
AST: 329 U/L — AB (ref 15–41)
Albumin: 4.5 g/dL (ref 3.5–5.0)
Anion gap: 14 (ref 5–15)
BILIRUBIN TOTAL: 1.6 mg/dL — AB (ref 0.3–1.2)
BUN: 8 mg/dL (ref 6–20)
CALCIUM: 9.5 mg/dL (ref 8.9–10.3)
CO2: 27 mmol/L (ref 22–32)
CREATININE: 0.92 mg/dL (ref 0.61–1.24)
Chloride: 97 mmol/L — ABNORMAL LOW (ref 101–111)
Glucose, Bld: 94 mg/dL (ref 65–99)
Potassium: 3.8 mmol/L (ref 3.5–5.1)
Sodium: 138 mmol/L (ref 135–145)
Total Protein: 8 g/dL (ref 6.5–8.1)

## 2017-08-20 LAB — CBC
HCT: 37.2 % — ABNORMAL LOW (ref 39.0–52.0)
Hemoglobin: 12.6 g/dL — ABNORMAL LOW (ref 13.0–17.0)
MCH: 31.7 pg (ref 26.0–34.0)
MCHC: 33.9 g/dL (ref 30.0–36.0)
MCV: 93.5 fL (ref 78.0–100.0)
Platelets: 121 10*3/uL — ABNORMAL LOW (ref 150–400)
RBC: 3.98 MIL/uL — AB (ref 4.22–5.81)
RDW: 14.1 % (ref 11.5–15.5)
WBC: 4.5 10*3/uL (ref 4.0–10.5)

## 2017-08-20 LAB — LIPASE, BLOOD: Lipase: 35 U/L (ref 11–51)

## 2017-08-20 LAB — RAPID URINE DRUG SCREEN, HOSP PERFORMED
Amphetamines: NOT DETECTED
BARBITURATES: NOT DETECTED
BENZODIAZEPINES: NOT DETECTED
COCAINE: NOT DETECTED
Opiates: NOT DETECTED
TETRAHYDROCANNABINOL: NOT DETECTED

## 2017-08-20 MED ORDER — SODIUM CHLORIDE 0.9 % IV SOLN
1000.0000 mg | Freq: Once | INTRAVENOUS | Status: AC
  Administered 2017-08-20: 1000 mg via INTRAVENOUS
  Filled 2017-08-20: qty 10

## 2017-08-20 MED ORDER — METOCLOPRAMIDE HCL 5 MG/ML IJ SOLN
10.0000 mg | Freq: Once | INTRAMUSCULAR | Status: AC
  Administered 2017-08-20: 10 mg via INTRAVENOUS
  Filled 2017-08-20: qty 2

## 2017-08-20 MED ORDER — LEVETIRACETAM 500 MG PO TABS: 500.0000 mg | ORAL_TABLET | Freq: Two times a day (BID) | ORAL | 0 refills | Status: DC

## 2017-08-20 MED ORDER — CHLORDIAZEPOXIDE HCL 25 MG PO CAPS: ORAL_CAPSULE | ORAL | 0 refills | Status: AC

## 2017-08-20 MED ORDER — ONDANSETRON 4 MG PO TBDP: 4.0000 mg | ORAL_TABLET | Freq: Three times a day (TID) | ORAL | 0 refills | Status: AC | PRN

## 2017-08-20 MED ORDER — SODIUM CHLORIDE 0.9 % IV BOLUS (SEPSIS)
1000.0000 mL | Freq: Once | INTRAVENOUS | Status: AC
  Administered 2017-08-20: 1000 mL via INTRAVENOUS

## 2017-08-20 MED ORDER — CHLORDIAZEPOXIDE HCL 25 MG PO CAPS
50.0000 mg | ORAL_CAPSULE | Freq: Once | ORAL | Status: AC
  Administered 2017-08-20: 50 mg via ORAL
  Filled 2017-08-20: qty 2

## 2017-08-20 MED ORDER — PANTOPRAZOLE SODIUM 40 MG PO TBEC: 40.0000 mg | DELAYED_RELEASE_TABLET | Freq: Every day | ORAL | 0 refills | Status: AC

## 2017-08-20 NOTE — ED Notes (Signed)
Called patient x 2 , no answer/

## 2017-08-20 NOTE — ED Notes (Signed)
Patient transported to [facility] 

## 2017-08-20 NOTE — ED Provider Notes (Signed)
[facility] Provider Note   CSN: [id number] Arrival date & time: 08/20/17  1330     History   Chief Complaint Chief Complaint  Patient presents with  . Emesis  . Abdominal Pain  . Withdrawal    HPI [name] is a 41 y.o. male.  HPI  41 year old male presents with abdominal pain and vomiting. Vomiting started yesterday. Has had a couple episodes of diarrhea. Once in the waiting room he noticed a very small amount of blood when vomiting. Has not noticed blood otherwise. He has been having abdominal pain, he points to his right upper quadrant, left upper quadrant, and epigastrium. He states he has had pain and vomiting like this on and off for a couple years. Feels similar. He tells me that he has had no alcohol for the last couple days but his family states he had alcohol yesterday and kept it down. He drinks almost every day. No alcohol today. He feels slightly tremulous. He has been feeling dizzy which typically precedes seizures. He has not taken his seizure medicine in a couple months because he has not followed up with his PCP. Originally he tells me he is on Dilantin but it seemed like that was his old medicine and he is supposed to be on Keppra. No urinary symptoms. No seizures for last couple months.  Past Medical History:  Diagnosis Date  . Arthritis    hands  . Avascular necrosis (HCC)    L HIP  . Avascular necrosis of hip (HCC)    left  . Schizophrenia (HCC)   . Seizures (HCC)    HX [place] seiaures - last seizure 6-7 months ago  . Substance abuse    has not used for 5-6 yrs    Patient Active Problem List   Diagnosis Date Noted  . Alcohol withdrawal (HCC) 08/25/2016  . H/O noncompliance with medical treatment, presenting hazards to health 08/25/2016  . Seizure (HCC) 08/25/2016  . Paranoid schizophrenia (HCC) 08/11/2015  . Seizure disorder (HCC) 08/10/2015  . Alcohol use disorder, severe, dependence (HCC) 08/10/2015  . Hx of schizophrenia   .  Expected blood loss anemia 04/30/2013  . Overweight (BMI 25.0-29.9) 04/30/2013  . S/P left THA, AA 04/29/2013    Past Surgical History:  Procedure Laterality Date  . NO PAST SURGERIES    . TOTAL HIP ARTHROPLASTY Left 04/29/2013   Procedure: TOTAL HIP ARTHROPLASTY ANTERIOR APPROACH;  Surgeon: Shelda Pal, MD;  Location: [facility];  Service: Orthopedics;  Laterality: Left;       Home Medications    Prior to Admission medications   Medication Sig Start Date End Date Taking? Authorizing Provider  benztropine (COGENTIN) 1 MG tablet Take 1 tablet (1 mg total) by mouth 2 (two) times daily. Patient not taking: Reported on 12/26/2016 08/29/16   Dorothea Ogle, MD  chlordiazePOXIDE (LIBRIUM) 25 MG capsule 50mg  PO TID x 1D, then 25-50mg  PO BID X 1D, then 25-50mg  PO QD X 1D 08/20/17   Pricilla Loveless, MD  EPINEPHrine 0.3 mg/0.3 mL IJ SOAJ injection Inject 0.3 mLs (0.3 mg total) into the muscle as needed. Patient not taking: Reported on 08/20/2017 08/16/15   Thermon Leyland, NP  haloperidol (HALDOL) 10 MG tablet Take one tablet (10 mg) at bedtime. Patient not taking: Reported on 12/26/2016 08/16/15   Thermon Leyland, NP  haloperidol (HALDOL) 5 MG tablet Take 1 tablet (5 mg total) by mouth daily with breakfast. Patient not taking: Reported on 12/26/2016  08/16/15   Thermon Leyland, NP  haloperidol decanoate (HALDOL DECANOATE) 100 MG/ML injection Inject 0.5 mLs (50 mg total) into the muscle every 21 ( twenty-one) days. Patient not taking: Reported on 12/26/2016 09/06/15   Thermon Leyland, NP  ibuprofen (ADVIL,MOTRIN) 600 MG tablet Take 1 tablet (600 mg total) by mouth every 6 (six) hours as needed. Patient not taking: Reported on 08/20/2017 05/14/17   Benjiman Core, MD  levETIRAcetam (KEPPRA) 500 MG tablet Take 1 tablet (500 mg total) by mouth 2 (two) times daily. 08/20/17 09/19/17  Pricilla Loveless, MD  ondansetron (ZOFRAN ODT) 4 MG disintegrating tablet Take 1 tablet (4 mg total) by mouth every 8 (eight) hours as needed  for nausea or vomiting. 08/20/17   Pricilla Loveless, MD  pantoprazole (PROTONIX) 40 MG tablet Take 1 tablet (40 mg total) by mouth daily. 08/20/17   Pricilla Loveless, MD    Family History Family History  Problem Relation Age of Onset  . Cancer Brother   . Diabetes Mother   . Hypertension Mother   . Cancer Father   . Hypertension Father   . Hypertension Sister   . Diabetes Maternal Grandmother     Social History Social History  Substance Use Topics  . Smoking status: Current Every Day Smoker    Packs/day: 1.50    Years: 5.00    Types: Cigarettes  . Smokeless tobacco: Never Used  . Alcohol use 3.6 oz/week    6 Cans of beer per week     Comment: Pt. states that he drinks beer every couple of days 2 (12 oz cans)     Allergies   Bee venom   Review of Systems Review of Systems  Constitutional: Negative for fever.  Gastrointestinal: Positive for abdominal pain, diarrhea, nausea and vomiting. Negative for blood in stool.  Genitourinary: Negative for dysuria.  Neurological: Positive for dizziness. Negative for seizures.  All other systems reviewed and are negative.    Physical Exam Updated Vital Signs BP (!) 126/92   Pulse 81   Temp 98.7 F (37.1 C) (Oral)   Resp 16   SpO2 100%   Physical Exam  Constitutional: He is oriented to person, place, and time. He appears well-developed and well-nourished.  HENT:  Head: Normocephalic and atraumatic.  Right Ear: External ear normal.  Left Ear: External ear normal.  Nose: Nose normal.  Eyes: Right eye exhibits no discharge. Left eye exhibits no discharge.  Neck: Neck supple.  Cardiovascular: Normal rate, regular rhythm and normal heart sounds.   Pulmonary/Chest: Effort normal and breath sounds normal.  Abdominal: Soft. There is tenderness (mild) in the epigastric area.  Musculoskeletal: He exhibits no edema.  Neurological: He is alert and oriented to person, place, and time.  CN 3-12 grossly intact. 5/5 strength in all 4  extremities. Grossly normal sensation. Normal finger to nose. No tremor.  Skin: Skin is warm and dry.  Nursing note and vitals reviewed.    ED Treatments / Results  Labs (all labs ordered are listed, but only abnormal results are displayed) Labs Reviewed  COMPREHENSIVE METABOLIC PANEL - Abnormal; Notable for the following:       Result Value   Chloride 97 (*)    AST 329 (*)    ALT 161 (*)    Total Bilirubin 1.6 (*)    All other components within normal limits  CBC - Abnormal; Notable for the following:    RBC 3.98 (*)    Hemoglobin 12.6 (*)  HCT 37.2 (*)    Platelets 121 (*)    All other components within normal limits  URINALYSIS, ROUTINE W REFLEX MICROSCOPIC - Abnormal; Notable for the following:    Color, Urine AMBER (*)    pH 9.0 (*)    Bilirubin Urine SMALL (*)    Ketones, ur 20 (*)    Protein, ur >=300 (*)    Leukocytes, UA MODERATE (*)    Squamous Epithelial / LPF 0-5 (*)    All other components within normal limits  LIPASE, BLOOD  RAPID URINE DRUG SCREEN, HOSP PERFORMED    EKG  EKG Interpretation None       Radiology US Abdomen Limited Ruq  Result Date: 08/20/2017 CLINICAL DATA:  Epigastric abdominal pain tonight, with nausea. EXAM: ULTRASOUND ABDOMEN LIMITED RIGHT UPPER QUADRANT COMPARISON:  CT 03/05/2017 FINDINGS: Gallbladder: There is cholelithiasis, with calculi measuring up to 6.8 mm. No gallbladder mural thickening. No pericholecystic fluid. No significant tenderness over the gallbladder during the exam. Common bile duct: Diameter: 3.5 mm Liver: Generalized echogenicity of hepatic parenchyma, consistent with fatty infiltration. No focal liver lesion. Portal vein is patent on color Doppler imaging with normal direction of blood flow towards the liver. IMPRESSION: 1. Cholelithiasis without sonographic evidence of acute cholecystitis. 2. Generalized echogenicity of hepatic parenchyma, suggesting fatty infiltration. Electronically Signed   By: Ellery Plunk M.D.   On: 08/20/2017 23:08    Procedures Procedures (including critical care time)  Medications Ordered in ED Medications  sodium chloride 0.9 % bolus 1,000 mL (0 mLs Intravenous Stopped 08/20/17 2310)  metoCLOPramide (REGLAN) injection 10 mg (10 mg Intravenous Given 08/20/17 1955)  levETIRAcetam (KEPPRA) 1,000 mg in sodium chloride 0.9 % 100 mL IVPB (0 mg Intravenous Stopped 08/20/17 2200)  chlordiazePOXIDE (LIBRIUM) capsule 50 mg (50 mg Oral Given 08/20/17 2145)     Initial Impression / Assessment and Plan / ED Course  I have reviewed the triage vital signs and the nursing notes.  Pertinent labs & imaging results that were available during my care of the patient were reviewed by me and considered in my medical decision making (see chart for details).     Patient's abdominal pain has resolved. No vomiting after treatment. He is able to eat and drink. Ultrasound shows cholelithiasis but no cholecystitis. However he also states this abdominal pain has been on off for a while. Is having diarrhea as well. Given no further abdominal pain on repeat exam, I do not think further imaging or testing is needed. He has not appeared to have significant withdrawal symptoms at this time, no tremors, agitation, tachycardia, or hypertension. He is okay with trying to wean himself off alcohol and thus he will be given Librium prescription. Chart review shows he is supposed to be on Keppra, I have given him a bolus of this and will prescribe this as an outpatient. Follow-up with PCP. Discussed return precautions. Discussed need to follow-up with an outpatient facility to help facilitate alcohol detox.  Final Clinical Impressions(s) / ED Diagnoses   Final diagnoses:  Epigastric abdominal pain  Alcohol abuse    New Prescriptions New Prescriptions   CHLORDIAZEPOXIDE (LIBRIUM) 25 MG CAPSULE    50mg  PO TID x 1D, then 25-50mg  PO BID X 1D, then 25-50mg  PO QD X 1D   ONDANSETRON (ZOFRAN ODT) 4 MG  DISINTEGRATING TABLET    Take 1 tablet (4 mg total) by mouth every 8 (eight) hours as needed for nausea or vomiting.   PANTOPRAZOLE (PROTONIX) 40 MG TABLET  Take 1 tablet (40 mg total) by mouth daily.     Pricilla Loveless, MD 08/20/17 [phone]

## 2017-08-20 NOTE — ED Notes (Signed)
Pt ambulatory to restroom and back with steady gait.

## 2017-08-20 NOTE — ED Triage Notes (Signed)
Per family states pt has had n/v/d and is going thru withdrawal from ETOH last drink was 2 days ago , pt has the shakes and abd pain

## 2017-10-21 ENCOUNTER — Emergency Department (HOSPITAL_COMMUNITY)
Admission: EM | Admit: 2017-10-21 | Discharge: 2017-10-21 | Disposition: A | Discharge status: Home / Self Care | Payer: Medicaid Other | Attending: Emergency Medicine | Admitting: Emergency Medicine

## 2017-10-21 ENCOUNTER — Encounter (HOSPITAL_COMMUNITY): Payer: Self-pay | Admitting: Emergency Medicine

## 2017-10-21 DIAGNOSIS — F17210 Nicotine dependence, cigarettes, uncomplicated: Secondary | ICD-10-CM | POA: Diagnosis not present

## 2017-10-21 DIAGNOSIS — G40909 Epilepsy, unspecified, not intractable, without status epilepticus: Secondary | ICD-10-CM | POA: Diagnosis not present

## 2017-10-21 DIAGNOSIS — Z79899 Other long term (current) drug therapy: Secondary | ICD-10-CM | POA: Diagnosis not present

## 2017-10-21 DIAGNOSIS — Z760 Encounter for issue of repeat prescription: Secondary | ICD-10-CM

## 2017-10-21 MED ORDER — LEVETIRACETAM 500 MG PO TABS: 500.0000 mg | ORAL_TABLET | Freq: Two times a day (BID) | ORAL | 0 refills | Status: AC

## 2017-10-21 NOTE — ED Notes (Addendum)
Pt here for a medication refill. Pt states he does not have a PCP and comes to the ED for refills. Pt getting agitated and stopped answering the PA's questions.

## 2017-10-21 NOTE — Discharge Instructions (Signed)
I have refilled your Keppra for 1 month. You must follow up with a primary care physician for further refills of your medications. Return to the ED if any concerning signs or symptoms develop.

## 2017-10-21 NOTE — ED Provider Notes (Signed)
[facility] EMERGENCY DEPARTMENT Provider Note   CSN: [id number] Arrival date & time: 10/21/17  2216     History   Chief Complaint Chief Complaint  Patient presents with  . Medication Refill    HPI [name] is a 41 y.o. male with history of AVN of the left hip, schizophrenia, substance abuse, and seizures. Also has a history of noncompliance with his medications. Presents today requesting medication refill of his Keppra. He states that he has 1 tablet left. He is also requesting refills of his schizophrenia medications. States that he does not have a primary care physician. Denies any recent seizures.  Denies any other symptoms at this time. He states that he "drank a few beers "yesterday, denies alcohol or drug use today.  The history is provided by the patient.    Past Medical History:  Diagnosis Date  . Arthritis    hands  . Avascular necrosis (HCC)    L HIP  . Avascular necrosis of hip (HCC)    left  . Schizophrenia (HCC)   . Seizures (HCC)    HX [place] seiaures - last seizure 6-7 months ago  . Substance abuse (HCC)    has not used for 5-6 yrs    Patient Active Problem List   Diagnosis Date Noted  . Alcohol withdrawal (HCC) 08/25/2016  . H/O noncompliance with medical treatment, presenting hazards to health 08/25/2016  . Seizure (HCC) 08/25/2016  . Paranoid schizophrenia (HCC) 08/11/2015  . Seizure disorder (HCC) 08/10/2015  . Alcohol use disorder, severe, dependence (HCC) 08/10/2015  . Hx of schizophrenia   . Expected blood loss anemia 04/30/2013  . Overweight (BMI 25.0-29.9) 04/30/2013  . S/P left THA, AA 04/29/2013    Past Surgical History:  Procedure Laterality Date  . NO PAST SURGERIES    . TOTAL HIP ARTHROPLASTY Left 04/29/2013   Procedure: TOTAL HIP ARTHROPLASTY ANTERIOR APPROACH;  Surgeon: Shelda Pal, MD;  Location: [facility];  Service: Orthopedics;  Laterality: Left;       Home Medications    Prior to Admission  medications   Medication Sig Start Date End Date Taking? Authorizing Provider  benztropine (COGENTIN) 1 MG tablet Take 1 tablet (1 mg total) by mouth 2 (two) times daily. Patient not taking: Reported on 12/26/2016 08/29/16   Dorothea Ogle, MD  chlordiazePOXIDE (LIBRIUM) 25 MG capsule 50mg  PO TID x 1D, then 25-50mg  PO BID X 1D, then 25-50mg  PO QD X 1D 08/20/17   Pricilla Loveless, MD  EPINEPHrine 0.3 mg/0.3 mL IJ SOAJ injection Inject 0.3 mLs (0.3 mg total) into the muscle as needed. Patient not taking: Reported on 08/20/2017 08/16/15   Thermon Leyland, NP  haloperidol (HALDOL) 10 MG tablet Take one tablet (10 mg) at bedtime. Patient not taking: Reported on 12/26/2016 08/16/15   Thermon Leyland, NP  haloperidol (HALDOL) 5 MG tablet Take 1 tablet (5 mg total) by mouth daily with breakfast. Patient not taking: Reported on 12/26/2016 08/16/15   Thermon Leyland, NP  haloperidol decanoate (HALDOL DECANOATE) 100 MG/ML injection Inject 0.5 mLs (50 mg total) into the muscle every 21 ( twenty-one) days. Patient not taking: Reported on 12/26/2016 09/06/15   Thermon Leyland, NP  ibuprofen (ADVIL,MOTRIN) 600 MG tablet Take 1 tablet (600 mg total) by mouth every 6 (six) hours as needed. Patient not taking: Reported on 08/20/2017 05/14/17   Benjiman Core, MD  levETIRAcetam (KEPPRA) 500 MG tablet Take 1 tablet (500 mg total) by mouth  2 (two) times daily. 10/21/17 11/20/17  Michela Pitcher A, PA-C  ondansetron (ZOFRAN ODT) 4 MG disintegrating tablet Take 1 tablet (4 mg total) by mouth every 8 (eight) hours as needed for nausea or vomiting. 08/20/17   Pricilla Loveless, MD  pantoprazole (PROTONIX) 40 MG tablet Take 1 tablet (40 mg total) by mouth daily. 08/20/17   Pricilla Loveless, MD    Family History Family History  Problem Relation Age of Onset  . Cancer Brother   . Diabetes Mother   . Hypertension Mother   . Cancer Father   . Hypertension Father   . Hypertension Sister   . Diabetes Maternal Grandmother     Social  History Social History  Substance Use Topics  . Smoking status: Current Every Day Smoker    Packs/day: 1.50    Years: 5.00    Types: Cigarettes  . Smokeless tobacco: Never Used  . Alcohol use 3.6 oz/week    6 Cans of beer per week     Comment: Pt. states that he drinks beer every couple of days 2 (12 oz cans)     Allergies   Bee venom   Review of Systems Review of Systems  Respiratory: Negative for shortness of breath.   Cardiovascular: Negative for chest pain.  Neurological: Negative for seizures, weakness and numbness.     Physical Exam Updated Vital Signs BP 116/83 (BP Location: Left Arm)   Pulse (!) 103   Temp 98.2 F (36.8 C) (Oral)   Resp 16   Ht 5\' 5"  (1.651 m)   Wt 63.5 kg (140 lb)   SpO2 96%   BMI 23.30 kg/m   Physical Exam  Constitutional: He appears well-developed and well-nourished. No distress.  HENT:  Head: Normocephalic and atraumatic.  Mouth/Throat: Oropharynx is clear and moist.  Eyes: Conjunctivae are normal. Right eye exhibits no discharge. Left eye exhibits no discharge.  Neck: No JVD present. No tracheal deviation present.  Cardiovascular:  Slightly tachycardic  Pulmonary/Chest: Effort normal.  Abdominal: He exhibits no distension.  Musculoskeletal: He exhibits no edema.  Moves extremities spontaneously with good strength.  Neurological: He is alert. He exhibits normal muscle tone.  Slightly slurred speech, appears intoxicated. Answering questions appropriately when he chooses to answer questions. Overall noncooperative. Ambulatory without difficulty. No facial droop.  Skin: Skin is warm and dry. No erythema.  Psychiatric: He has a normal mood and affect. His behavior is normal.  Nursing note and vitals reviewed.    ED Treatments / Results  Labs (all labs ordered are listed, but only abnormal results are displayed) Labs Reviewed - No data to display  EKG  EKG Interpretation None       Radiology No results  found.  Procedures Procedures (including critical care time)  Medications Ordered in ED Medications - No data to display   Initial Impression / Assessment and Plan / ED Course  I have reviewed the triage vital signs and the nursing notes.  Pertinent labs & imaging results that were available during my care of the patient were reviewed by me and considered in my medical decision making (see chart for details).     Patient requesting medication refill. He is nontoxic in appearance although he does appear intoxicated and endorses alcohol consumption yesterday. No recent seizures. No focal neurological deficits although he is not cooperative with a full examination. Extending to the patient that we do not typically refill  Psych medications in the emergency department and he verbalized understanding. Will refill his  Keppra, but emphasized the importance of establishing care with a primary care physician and provided patient with resources outpatient. Discussed indications for return to the ED. Pt verbalized understanding of and agreement with plan and is safe for discharge home at this time. Discussed plan with my attending Dr. Rosalia Hammersay, who agrees with assessment and plan at this time.   Final Clinical Impressions(s) / ED Diagnoses   Final diagnoses:  Medication refill    New Prescriptions New Prescriptions   LEVETIRACETAM (KEPPRA) 500 MG TABLET    Take 1 tablet (500 mg total) by mouth 2 (two) times daily.     Jeanie SewerFawze, Vibhav Waddill A, PA-C 10/21/17 2308    Margarita Grizzleay, Danielle, MD 10/23/17 [phone]

## 2017-10-21 NOTE — ED Triage Notes (Signed)
Pt states he need his home medication refilled states he doesn't have a PCP, some ETOH on board.

## 2017-12-30 ENCOUNTER — Encounter (HOSPITAL_COMMUNITY): Payer: Self-pay | Admitting: Emergency Medicine

## 2017-12-30 ENCOUNTER — Other Ambulatory Visit: Payer: Self-pay

## 2017-12-30 DIAGNOSIS — M25551 Pain in right hip: Secondary | ICD-10-CM | POA: Diagnosis present

## 2017-12-31 ENCOUNTER — Emergency Department (HOSPITAL_COMMUNITY)
Admission: EM | Admit: 2017-12-31 | Discharge: 2017-12-31 | Disposition: A | Discharge status: Home / Self Care | Payer: Medicaid Other | Attending: Emergency Medicine | Admitting: Emergency Medicine

## 2017-12-31 NOTE — ED Triage Notes (Signed)
Patient was called. No answer.

## 2018-01-03 IMAGING — CR DG SHOULDER 2+V*R*
3 series · 3 of 3 positions shown · non-contrast
Comparison: None.

CLINICAL DATA: Pain and swelling of the right shoulder since a fall
4 days ago.

EXAM:
RIGHT SHOULDER - 2+ VIEW

[shoulder grashey]
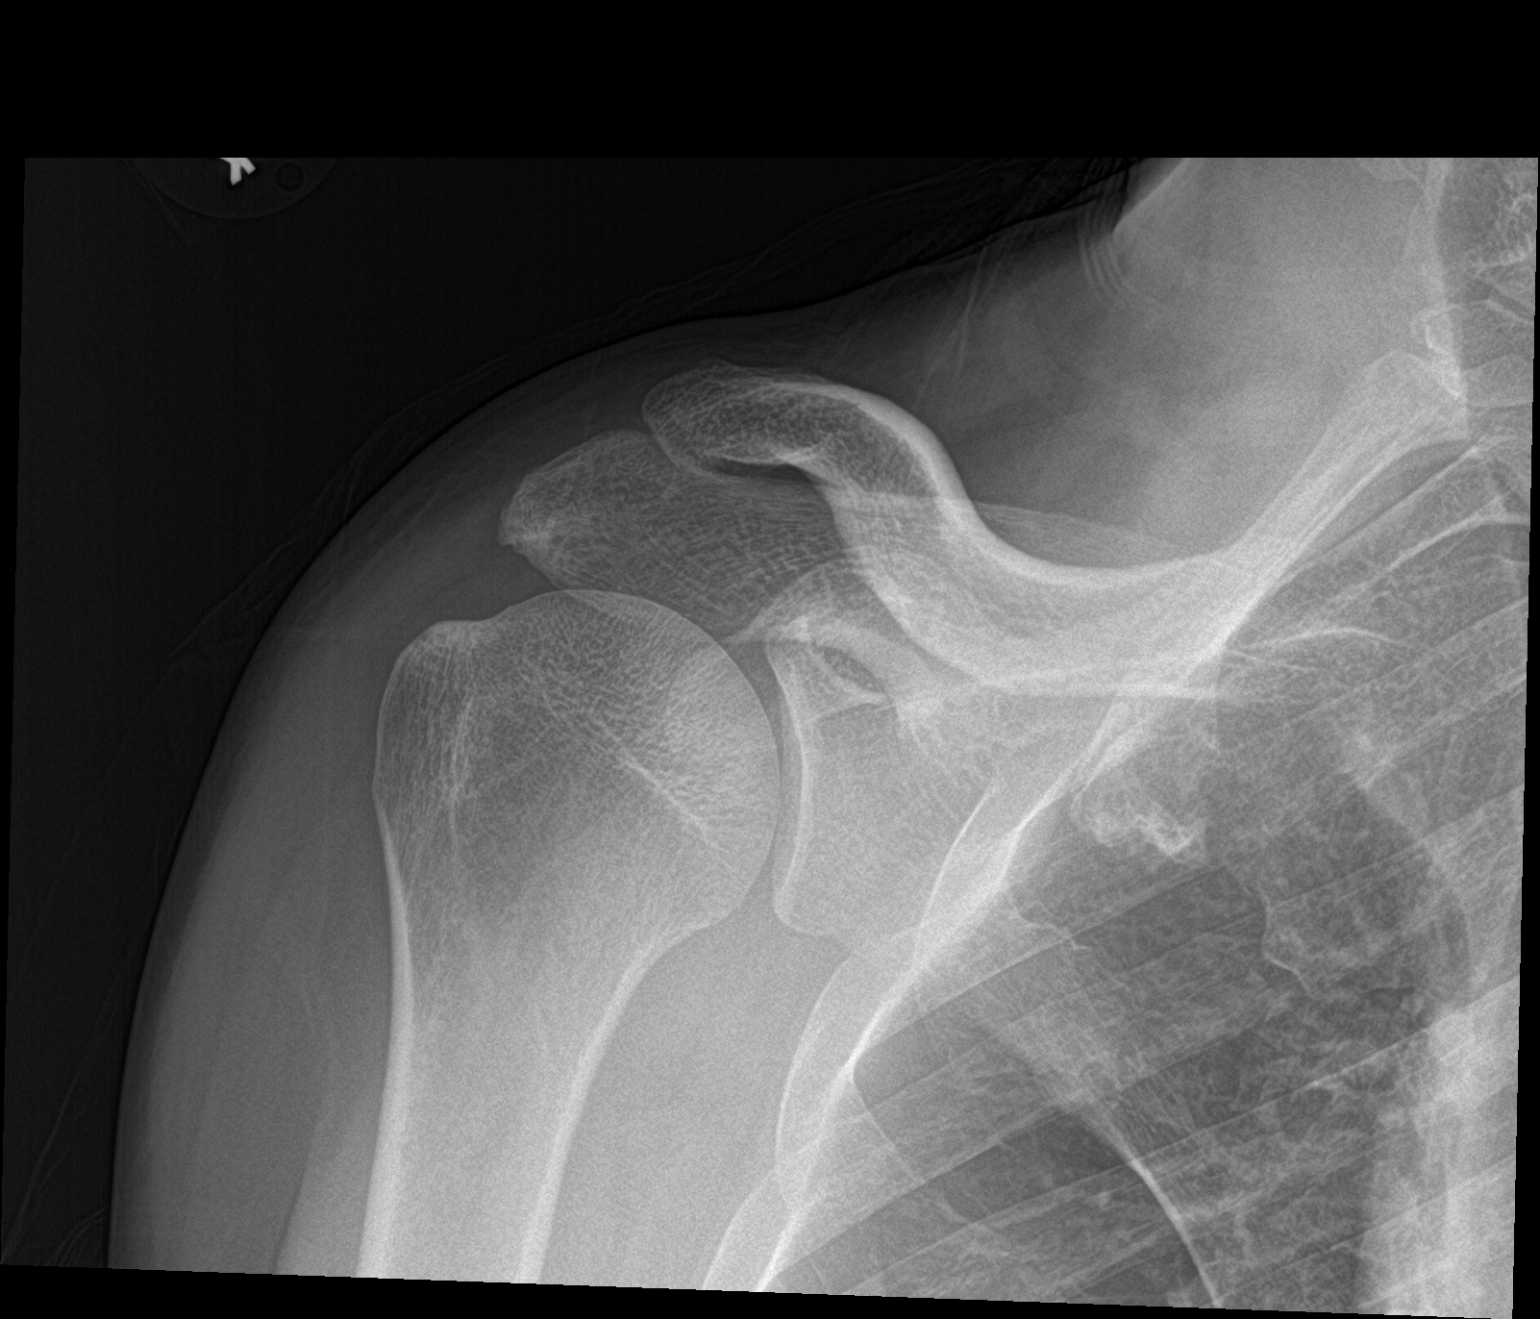

[shoulder y view]
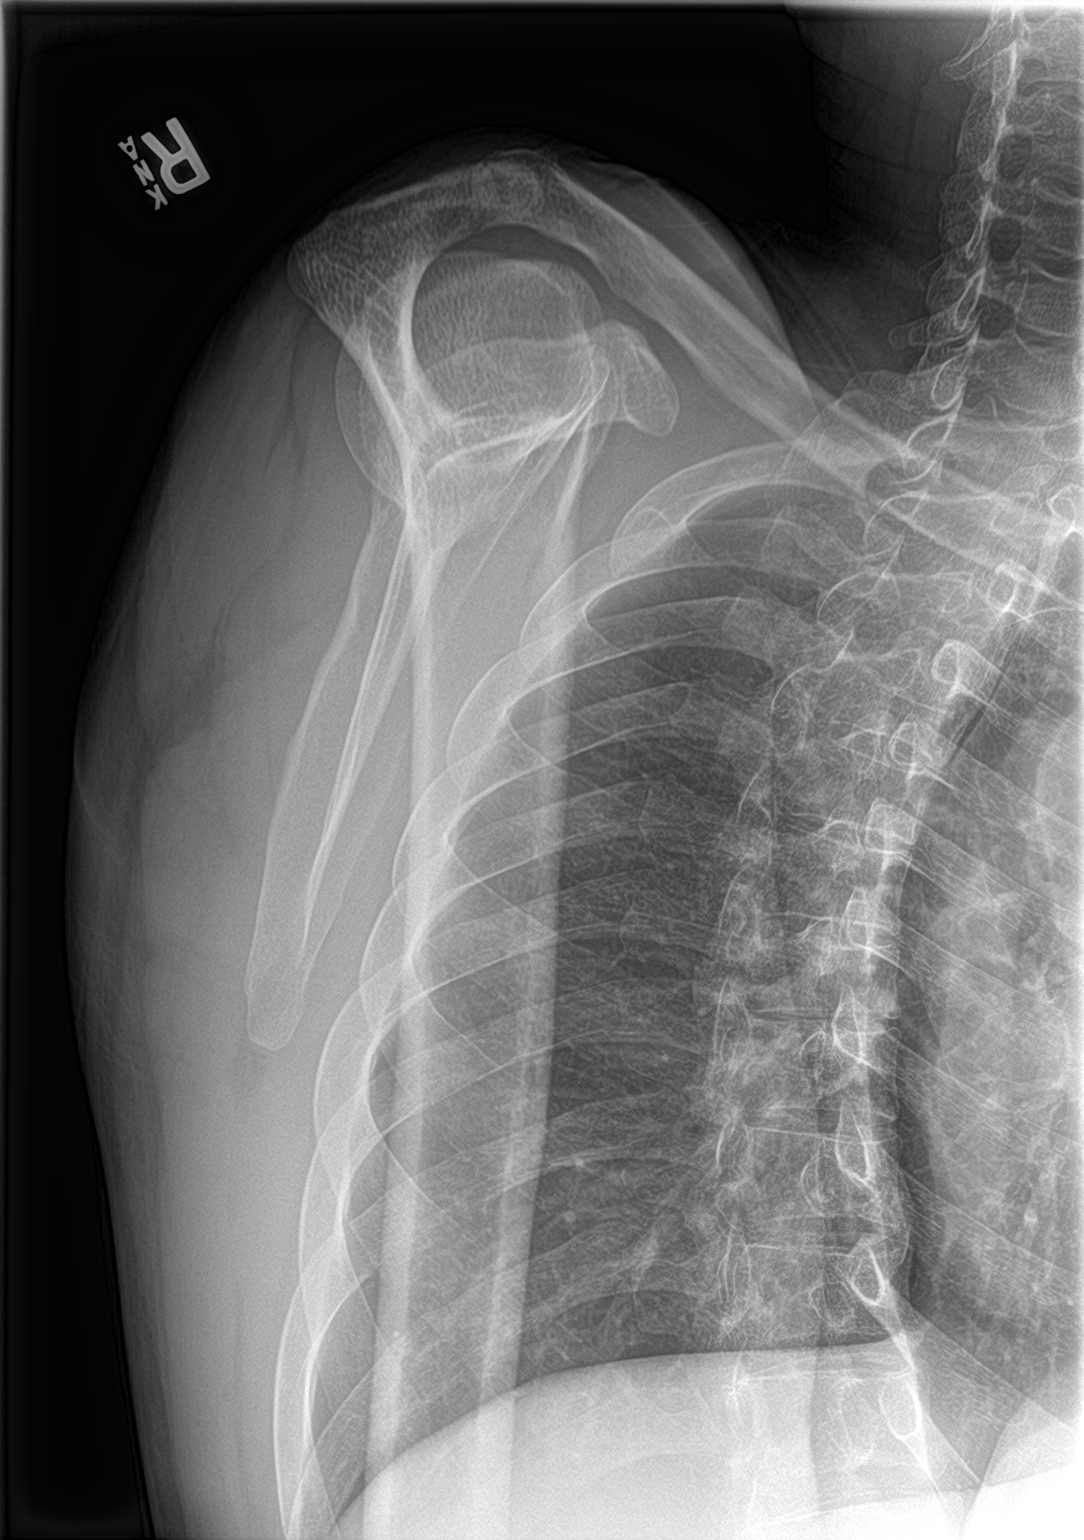

[shoulder axillary]
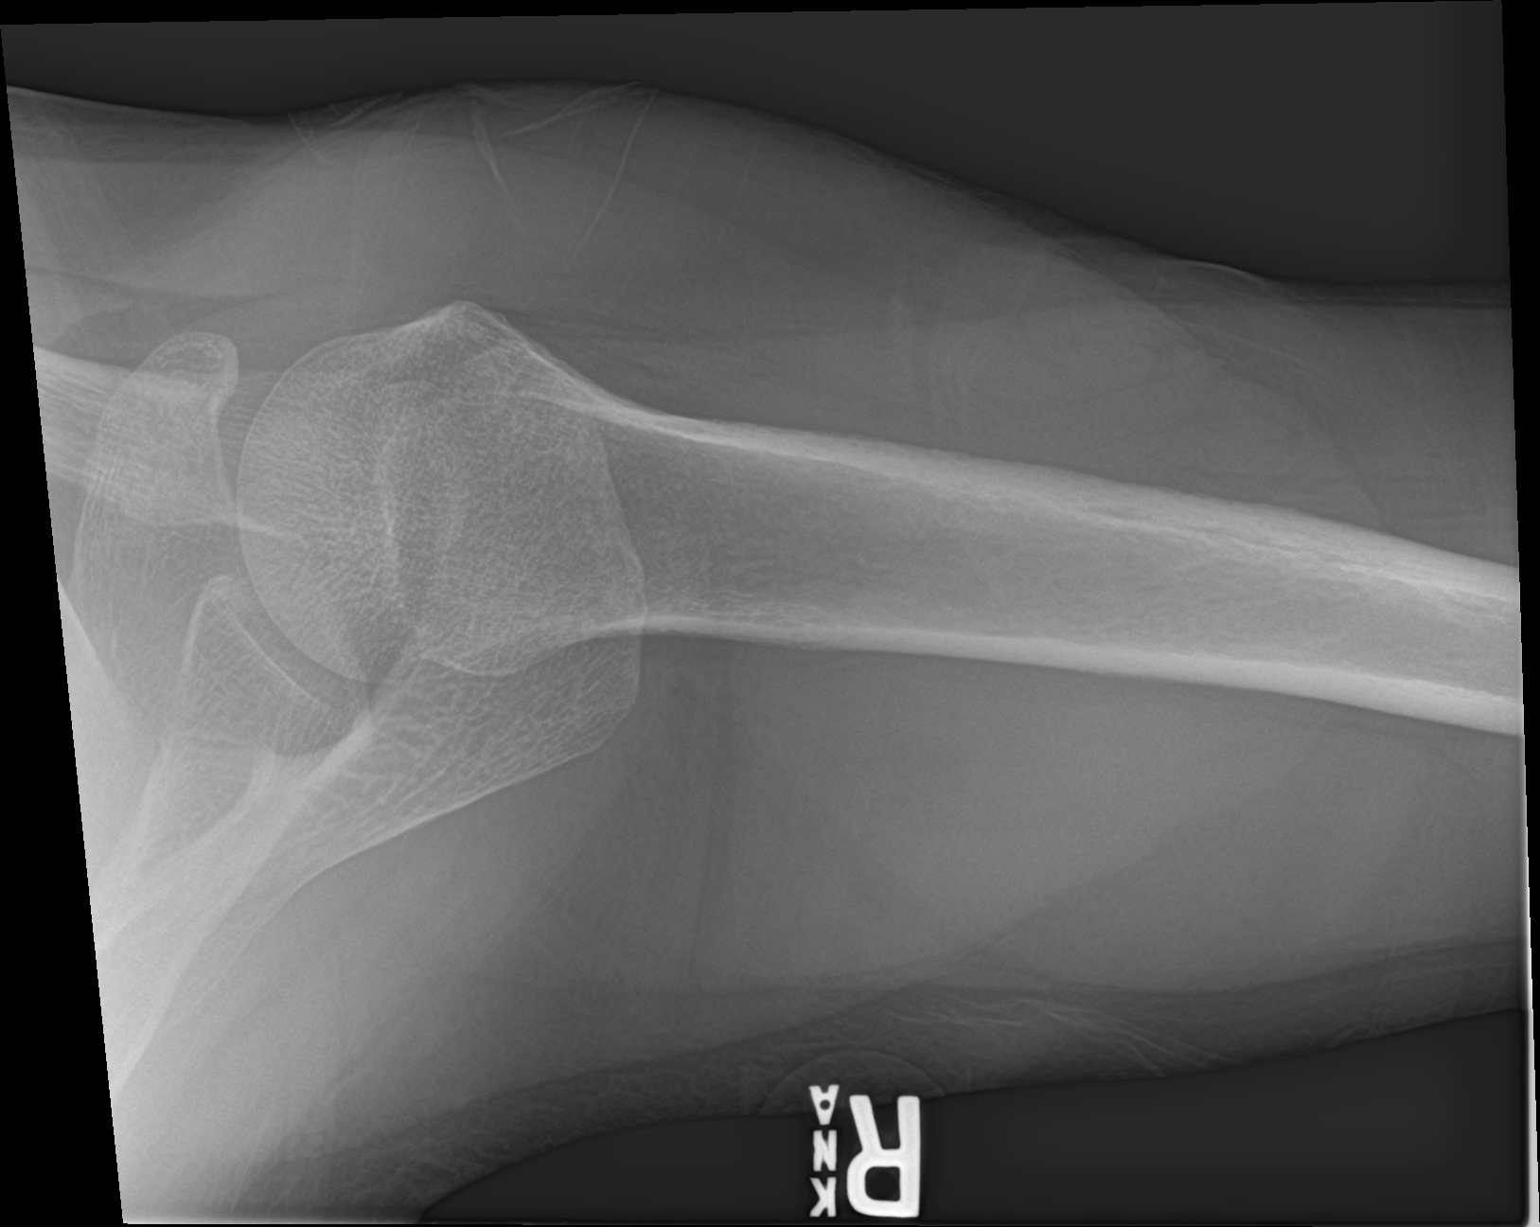

[3 of 3 positions shown; findings below may reference images not displayed]

FINDINGS: There is no evidence of fracture or dislocation. There is no
evidence of arthropathy or other focal bone abnormality. Soft
tissues are unremarkable.
IMPRESSION: Negative.

## 2018-03-06 ENCOUNTER — Encounter (INDEPENDENT_AMBULATORY_CARE_PROVIDER_SITE_OTHER): Payer: Self-pay | Admitting: Physician Assistant

## 2018-03-06 ENCOUNTER — Ambulatory Visit (INDEPENDENT_AMBULATORY_CARE_PROVIDER_SITE_OTHER): Payer: Medicaid Other

## 2018-03-06 ENCOUNTER — Ambulatory Visit (INDEPENDENT_AMBULATORY_CARE_PROVIDER_SITE_OTHER): Payer: Medicaid Other | Admitting: Physician Assistant

## 2018-03-06 DIAGNOSIS — M25552 Pain in left hip: Secondary | ICD-10-CM | POA: Diagnosis not present

## 2018-03-06 DIAGNOSIS — M87051 Idiopathic aseptic necrosis of right femur: Secondary | ICD-10-CM | POA: Diagnosis not present

## 2018-03-06 MED ORDER — LIDOCAINE HCL 1 % IJ SOLN
3.0000 mL | INTRAMUSCULAR | Status: AC | PRN
  Administered 2018-03-06: 3 mL

## 2018-03-06 MED ORDER — METHYLPREDNISOLONE ACETATE 40 MG/ML IJ SUSP
40.0000 mg | INTRAMUSCULAR | Status: AC | PRN
  Administered 2018-03-06: 40 mg via INTRA_ARTICULAR

## 2018-03-06 NOTE — Progress Notes (Signed)
Office Visit Note   Patient: [name]           Date of Birth: 12-09-1976           MRN: [id number] Visit Date: 03/06/2018              Requested by: Ethelda ChickSmith, Kristi M, MD [address] [place], [place] [id number] PCP: Marthenia RollingBland, Scott, DO   Assessment & Plan: Visit Diagnoses:  1. Pain in left hip   2. Avascular necrosis of hip, right (HCC)     Plan: We will have him follow-up in 2 weeks to see how well he is done with the injection of the left hip for the trochanteric bursitis.  I would like for him to keep some type of journal of how he responded to the injection and also how much pain he is having in the right hip.  His sister who is with him today see if she can help him differentiate which hip is bothering him the most.  Follow-Up Instructions: Return in about 2 weeks (around 03/20/2018).   Orders:  Orders Placed This Encounter  Procedures  . Large Joint Inj  . XR HIP UNILAT W OR W/O PELVIS 2-3 VIEWS LEFT   No orders of the defined types were placed in this encounter.     Procedures: Large Joint Inj: L greater trochanter on 03/06/2018 5:40 PM Indications: pain Details: 22 G 1.5 in needle, lateral approach  Arthrogram: No  Medications: 3 mL lidocaine 1 %; 40 mg methylPREDNISolone acetate 40 MG/ML Outcome: tolerated well, no immediate complications Procedure, treatment alternatives, risks and benefits explained, specific risks discussed. Consent was given by the patient. Immediately prior to procedure a time out was called to verify the correct patient, procedure, equipment, support staff and site/side marked as required. Patient was prepped and draped in the usual sterile fashion.       Clinical Data: No additional findings.   Subjective: Chief Complaint  Patient presents with  . Left Hip - Pain    HPI [name] is a 42 year old male comes in today with bilateral hip pain.  He states he has groin pain that radiates down both legs he is unable to say how far down  his legs this goes.  He states his hips give way on him and caused him to fall.  Has a history of a left total hip arthroplasty 5 years ago.  She has had pain in the left hip since the surgery.  He has had no frank dislocations of either hip.  He has nonalcoholic there is history of seizure disorder and schizophrenia.  He has difficulty verbalizing his pain and symptoms.  Presents today with his sister.  Both he and his sister states that his legs just give out from under him and they feel that it may be the left hip is giving way on him.  Review of CT of the abdomen 03/05/2017 AVN changes in the right femoral head.  Review of Systems Positive for occasional chills and recent low-grade fevers.  No chest pain shortness of breath nausea vomiting.  Objective: Vital Signs: There were no vitals taken for this visit.  Physical Exam  Constitutional: He appears well-developed and well-nourished. No distress.  Pulmonary/Chest: Effort normal.  Neurological: He is alert.  Skin: He is not diaphoretic.  Psychiatric: He is slowed.  Has difficulty verbalizing his pain and symptoms.   Ortho Exam Left hip excellent range of motion without pain.  Tenderness over the left trochanteric region.  Well-healed surgical incision without any signs of infection.  Calves are supple and nontender bilaterally.  Right hip he has discomfort with internal rotation.  Standing on each leg individually he has weakness of the right hip and pain.  He has some weakness with the left but no pain.  Leg lengths are equal. Specialty Comments:  No specialty comments available.  Imaging: Xr Hip Unilat W Or W/o Pelvis 2-3 Views Left  Result Date: 03/06/2018 AP pelvis lateral view of the left hip: Left total hip arthroplasty appears to be in good position overall good alignment with good bony ingrowth.  There is no evidence of loosening.  Right hip with avascular changes of the femoral head without collapse.  No acute  fractures.Marland Kitchen    PMFS History: Patient Active Problem List   Diagnosis Date Noted  . Alcohol withdrawal (HCC) 08/25/2016  . H/O noncompliance with medical treatment, presenting hazards to health 08/25/2016  . Seizure (HCC) 08/25/2016  . Paranoid schizophrenia (HCC) 08/11/2015  . Seizure disorder (HCC) 08/10/2015  . Alcohol use disorder, severe, dependence (HCC) 08/10/2015  . Hx of schizophrenia   . Expected blood loss anemia 04/30/2013  . Overweight (BMI 25.0-29.9) 04/30/2013  . S/P left THA, AA 04/29/2013   Past Medical History:  Diagnosis Date  . Arthritis    hands  . Avascular necrosis (HCC)    L HIP  . Avascular necrosis of hip (HCC)    left  . Schizophrenia (HCC)   . Seizures (HCC)    HX [place] seiaures - last seizure 6-7 months ago  . Substance abuse (HCC)    has not used for 5-6 yrs    Family History  Problem Relation Age of Onset  . Cancer Brother   . Diabetes Mother   . Hypertension Mother   . Cancer Father   . Hypertension Father   . Hypertension Sister   . Diabetes Maternal Grandmother     Past Surgical History:  Procedure Laterality Date  . NO PAST SURGERIES    . TOTAL HIP ARTHROPLASTY Left 04/29/2013   Procedure: TOTAL HIP ARTHROPLASTY ANTERIOR APPROACH;  Surgeon: Shelda Pal, MD;  Location: [facility];  Service: Orthopedics;  Laterality: Left;   Social History   Occupational History  . Not on file  Tobacco Use  . Smoking status: Current Every Day Smoker    Packs/day: 1.50    Years: 5.00    Pack years: 7.50    Types: Cigarettes  . Smokeless tobacco: Never Used  Substance and Sexual Activity  . Alcohol use: Yes    Alcohol/week: 3.6 oz    Types: 6 Cans of beer per week    Comment: Pt. states that he drinks beer every couple of days 2 (12 oz cans)  . Drug use: No  . Sexual activity: Yes    Birth control/protection: Condom

## 2018-03-21 ENCOUNTER — Ambulatory Visit (INDEPENDENT_AMBULATORY_CARE_PROVIDER_SITE_OTHER): Payer: Medicaid Other | Admitting: Physician Assistant

## 2018-03-21 ENCOUNTER — Encounter (INDEPENDENT_AMBULATORY_CARE_PROVIDER_SITE_OTHER): Payer: Self-pay | Admitting: Physician Assistant

## 2018-03-21 DIAGNOSIS — M7061 Trochanteric bursitis, right hip: Secondary | ICD-10-CM | POA: Diagnosis not present

## 2018-03-21 MED ORDER — METHYLPREDNISOLONE ACETATE 40 MG/ML IJ SUSP
40.0000 mg | INTRAMUSCULAR | Status: AC | PRN
  Administered 2018-03-21: 40 mg via INTRA_ARTICULAR

## 2018-03-21 MED ORDER — LIDOCAINE HCL 1 % IJ SOLN
3.0000 mL | INTRAMUSCULAR | Status: AC | PRN
  Administered 2018-03-21: 3 mL

## 2018-03-21 NOTE — Progress Notes (Signed)
   Procedure Note  Patient: [name]             Date of Birth: February 14, 1976           MRN: [id number]             Visit Date: 03/21/2018  HPI: Mr. [name] returns today follow-up of bilateral hip pain.  He states the trochanteric injection given on 03/06/2018 is a helped significantly with the left hip pain.  He is having pain mostly right hip now.  Physical exam: Left hip good range of motion without pain.  Nontender over the left trochanteric region.  Right hip good range of motion tenderness over the trochanteric region only.  Procedures: Visit Diagnoses: Trochanteric bursitis, right hip  Large Joint Inj on 03/21/2018 4:02 PM Indications: pain Details: 22 G 1.5 in needle, lateral approach  Arthrogram: No  Medications: 3 mL lidocaine 1 %; 40 mg methylPREDNISolone acetate 40 MG/ML Outcome: tolerated well, no immediate complications Procedure, treatment alternatives, risks and benefits explained, specific risks discussed. Consent was given by the patient. Immediately prior to procedure a time out was called to verify the correct patient, procedure, equipment, support staff and site/side marked as required. Patient was prepped and draped in the usual sterile fashion.     Plan: He will do IT band stretching exercises.  Follow-up on an as-needed basis pain persist or becomes worse.  Questions encouraged and answered.

## 2018-03-28 ENCOUNTER — Encounter (HOSPITAL_COMMUNITY): Payer: Self-pay | Admitting: *Deleted

## 2018-03-28 ENCOUNTER — Emergency Department (HOSPITAL_COMMUNITY)
Admission: EM | Admit: 2018-03-28 | Discharge: 2018-03-28 | Disposition: A | Discharge status: Home / Self Care | Payer: Medicaid Other | Attending: Emergency Medicine | Admitting: Emergency Medicine

## 2018-03-28 DIAGNOSIS — M25552 Pain in left hip: Secondary | ICD-10-CM | POA: Diagnosis not present

## 2018-03-28 DIAGNOSIS — Z79899 Other long term (current) drug therapy: Secondary | ICD-10-CM | POA: Diagnosis not present

## 2018-03-28 DIAGNOSIS — F1010 Alcohol abuse, uncomplicated: Secondary | ICD-10-CM | POA: Diagnosis not present

## 2018-03-28 DIAGNOSIS — Z96642 Presence of left artificial hip joint: Secondary | ICD-10-CM | POA: Diagnosis not present

## 2018-03-28 DIAGNOSIS — F17210 Nicotine dependence, cigarettes, uncomplicated: Secondary | ICD-10-CM | POA: Diagnosis not present

## 2018-03-28 DIAGNOSIS — M25551 Pain in right hip: Secondary | ICD-10-CM | POA: Diagnosis not present

## 2018-03-28 DIAGNOSIS — F10920 Alcohol use, unspecified with intoxication, uncomplicated: Secondary | ICD-10-CM | POA: Diagnosis present

## 2018-03-28 MED ORDER — KETOROLAC TROMETHAMINE 60 MG/2ML IM SOLN
60.0000 mg | Freq: Once | INTRAMUSCULAR | Status: AC
  Administered 2018-03-28: 60 mg via INTRAMUSCULAR
  Filled 2018-03-28: qty 2

## 2018-03-28 MED ORDER — MELOXICAM 7.5 MG PO TABS: 7.5000 mg | ORAL_TABLET | Freq: Every day | ORAL | 0 refills | Status: AC

## 2018-03-28 NOTE — ED Triage Notes (Signed)
Per EMS, pt was drinking at his friends house. Pt's friends were concerned pt would have seizure so called EMS. Pt has hx of seizures. Pt states he had 2 12oz beers and a fifth of liquor.   CBG 130 BP 126/82 HR 124

## 2018-03-28 NOTE — Discharge Instructions (Signed)
See your Physician for recheck.  Return if any problems.  °

## 2018-03-28 NOTE — ED Provider Notes (Signed)
[facility]-EMERGENCY DEPT Provider Note   CSN: [id number] Arrival date & time: 03/28/18  1450     History   Chief Complaint Chief Complaint  Patient presents with  . Alcohol Intoxication    HPI [name] is a 42 y.o. male.  The history is provided by the patient. No language interpreter was used.  Hip Pain  This is a chronic problem. The current episode started more than 1 week ago. The problem occurs constantly. The problem has been gradually worsening. Nothing aggravates the symptoms. Nothing relieves the symptoms. He has tried nothing for the symptoms. The treatment provided no relief.  Pt reports he has pain in his hips.Pt reports he has a history of hip pain.    Past Medical History:  Diagnosis Date  . Arthritis    hands  . Avascular necrosis (HCC)    L HIP  . Avascular necrosis of hip (HCC)    left  . Schizophrenia (HCC)   . Seizures (HCC)    HX [place] seiaures - last seizure 6-7 months ago  . Substance abuse (HCC)    has not used for 5-6 yrs    Patient Active Problem List   Diagnosis Date Noted  . Alcohol withdrawal (HCC) 08/25/2016  . H/O noncompliance with medical treatment, presenting hazards to health 08/25/2016  . Seizure (HCC) 08/25/2016  . Paranoid schizophrenia (HCC) 08/11/2015  . Seizure disorder (HCC) 08/10/2015  . Alcohol use disorder, severe, dependence (HCC) 08/10/2015  . Hx of schizophrenia   . Expected blood loss anemia 04/30/2013  . Overweight (BMI 25.0-29.9) 04/30/2013  . S/P left THA, AA 04/29/2013    Past Surgical History:  Procedure Laterality Date  . NO PAST SURGERIES    . TOTAL HIP ARTHROPLASTY Left 04/29/2013   Procedure: TOTAL HIP ARTHROPLASTY ANTERIOR APPROACH;  Surgeon: Shelda Pal, MD;  Location: [facility];  Service: Orthopedics;  Laterality: Left;        Home Medications    Prior to Admission medications   Medication Sig Start Date End Date Taking? Authorizing Provider  benztropine (COGENTIN)  1 MG tablet Take 1 tablet (1 mg total) by mouth 2 (two) times daily. Patient not taking: Reported on 12/26/2016 08/29/16   Dorothea Ogle, MD  chlordiazePOXIDE (LIBRIUM) 25 MG capsule 50mg  PO TID x 1D, then 25-50mg  PO BID X 1D, then 25-50mg  PO QD X 1D 08/20/17   Pricilla Loveless, MD  EPINEPHrine 0.3 mg/0.3 mL IJ SOAJ injection Inject 0.3 mLs (0.3 mg total) into the muscle as needed. Patient not taking: Reported on 08/20/2017 08/16/15   Thermon Leyland, NP  haloperidol (HALDOL) 10 MG tablet Take one tablet (10 mg) at bedtime. Patient not taking: Reported on 12/26/2016 08/16/15   Thermon Leyland, NP  haloperidol (HALDOL) 5 MG tablet Take 1 tablet (5 mg total) by mouth daily with breakfast. Patient not taking: Reported on 12/26/2016 08/16/15   Thermon Leyland, NP  haloperidol decanoate (HALDOL DECANOATE) 100 MG/ML injection Inject 0.5 mLs (50 mg total) into the muscle every 21 ( twenty-one) days. Patient not taking: Reported on 12/26/2016 09/06/15   Thermon Leyland, NP  ibuprofen (ADVIL,MOTRIN) 600 MG tablet Take 1 tablet (600 mg total) by mouth every 6 (six) hours as needed. Patient not taking: Reported on 08/20/2017 05/14/17   Benjiman Core, MD  levETIRAcetam (KEPPRA) 500 MG tablet Take 1 tablet (500 mg total) by mouth 2 (two) times daily. 10/21/17 11/20/17  Michela Pitcher A, PA-C  ondansetron [facility]  ODT) 4 MG disintegrating tablet Take 1 tablet (4 mg total) by mouth every 8 (eight) hours as needed for nausea or vomiting. 08/20/17   Pricilla LovelessGoldston, Scott, MD  pantoprazole (PROTONIX) 40 MG tablet Take 1 tablet (40 mg total) by mouth daily. 08/20/17   Pricilla LovelessGoldston, Scott, MD    Family History Family History  Problem Relation Age of Onset  . Cancer Brother   . Diabetes Mother   . Hypertension Mother   . Cancer Father   . Hypertension Father   . Hypertension Sister   . Diabetes Maternal Grandmother     Social History Social History   Tobacco Use  . Smoking status: Current Every Day Smoker    Packs/day: 1.50    Years:  5.00    Pack years: 7.50    Types: Cigarettes  . Smokeless tobacco: Never Used  Substance Use Topics  . Alcohol use: Yes    Alcohol/week: 3.6 oz    Types: 6 Cans of beer per week    Comment: Pt. states that he drinks beer every couple of days 2 (12 oz cans)  . Drug use: No     Allergies   Bee venom   Review of Systems Review of Systems  All other systems reviewed and are negative.    Physical Exam Updated Vital Signs BP 127/83 (BP Location: Left Arm)   Pulse (!) 125   Temp 98.7 F (37.1 C) (Oral)   Resp 12   Ht 5\' 5"  (1.651 m)   SpO2 93%   BMI 26.63 kg/m   Physical Exam  Constitutional: He appears well-developed and well-nourished.  HENT:  Head: Normocephalic and atraumatic.  Eyes: Conjunctivae are normal.  Neck: Neck supple.  Cardiovascular: Normal rate and regular rhythm.  No murmur heard. Pulmonary/Chest: Effort normal and breath sounds normal. No respiratory distress.  Abdominal: Soft. There is no tenderness.  Musculoskeletal: He exhibits no edema.  From bilat hips, nontender to palpation   Neurological: He is alert.  Skin: Skin is warm and dry.  Psychiatric: He has a normal mood and affect.  Nursing note and vitals reviewed.    ED Treatments / Results  Labs (all labs ordered are listed, but only abnormal results are displayed) Labs Reviewed - No data to display  EKG None  Radiology No results found.  Procedures Procedures (including critical care time)  Medications Ordered in ED Medications  ketorolac (TORADOL) injection 60 mg (has no administration in time range)     Initial Impression / Assessment and Plan / ED Course  I have reviewed the triage vital signs and the nursing notes.  Pertinent labs & imaging results that were available during my care of the patient were reviewed by me and considered in my medical decision making (see chart for details).     Pt given torodol IM  Pt deies current intoxication.  Pt awake and  alert  Final Clinical Impressions(s) / ED Diagnoses   Final diagnoses:  Bilateral hip pain  Alcohol abuse    ED Discharge Orders        Ordered    meloxicam (MOBIC) 7.5 MG tablet  Daily     03/28/18 2035    An After Visit Summary was printed and given to the patient.    Osie CheeksSofia, Chevon Laufer K, PA-C 03/28/18 2036    Little, Ambrose Finlandachel Morgan, MD 03/30/18 2137

## 2018-03-28 NOTE — ED Notes (Signed)
Pt ambulated with a steady gait and requiring no assistance to his bed.

## 2019-04-25 DEATH — deceased
